# Patient Record
Sex: Female | Born: 1950
Health system: Southern US, Community
[De-identification: ages and names within clinical notes are randomized; demographics above are authoritative.]

## PROBLEM LIST (undated history)

## (undated) DIAGNOSIS — F17200 Nicotine dependence, unspecified, uncomplicated: Secondary | ICD-10-CM

## (undated) DIAGNOSIS — S42309A Unspecified fracture of shaft of humerus, unspecified arm, initial encounter for closed fracture: Secondary | ICD-10-CM

## (undated) DIAGNOSIS — N6320 Unspecified lump in the left breast, unspecified quadrant: Secondary | ICD-10-CM

## (undated) DIAGNOSIS — C801 Malignant (primary) neoplasm, unspecified: Secondary | ICD-10-CM

## (undated) DIAGNOSIS — M1990 Unspecified osteoarthritis, unspecified site: Secondary | ICD-10-CM

## (undated) DIAGNOSIS — R0603 Acute respiratory distress: Secondary | ICD-10-CM

## (undated) DIAGNOSIS — G5631 Lesion of radial nerve, right upper limb: Secondary | ICD-10-CM

## (undated) DIAGNOSIS — K219 Gastro-esophageal reflux disease without esophagitis: Secondary | ICD-10-CM

## (undated) DIAGNOSIS — K769 Liver disease, unspecified: Secondary | ICD-10-CM

## (undated) HISTORY — PX: APPENDECTOMY: SHX54

## (undated) HISTORY — PX: TUBAL LIGATION: SHX77

---

## 2001-01-09 ENCOUNTER — Other Ambulatory Visit: Admission: RE | Admit: 2001-01-09 | Discharge: 2001-01-09 | Payer: Self-pay | Admitting: Gynecology

## 2002-11-05 ENCOUNTER — Encounter: Admission: RE | Admit: 2002-11-05 | Discharge: 2002-11-05 | Payer: Self-pay | Admitting: Family Medicine

## 2002-11-05 ENCOUNTER — Encounter: Payer: Self-pay | Admitting: Family Medicine

## 2002-12-04 ENCOUNTER — Other Ambulatory Visit: Admission: RE | Admit: 2002-12-04 | Discharge: 2002-12-04 | Payer: Self-pay | Admitting: Family Medicine

## 2002-12-05 ENCOUNTER — Encounter: Admission: RE | Admit: 2002-12-05 | Discharge: 2002-12-05 | Payer: Self-pay | Admitting: Family Medicine

## 2002-12-05 ENCOUNTER — Encounter: Payer: Self-pay | Admitting: Family Medicine

## 2012-05-11 ENCOUNTER — Inpatient Hospital Stay (HOSPITAL_COMMUNITY)
Admission: EM | Admit: 2012-05-11 | Discharge: 2012-05-14 | DRG: 493 | Disposition: A | Discharge status: Home / Self Care | Payer: 59 | Attending: Emergency Medicine | Admitting: Emergency Medicine

## 2012-05-11 ENCOUNTER — Emergency Department (HOSPITAL_COMMUNITY): Payer: Self-pay

## 2012-05-11 ENCOUNTER — Encounter (HOSPITAL_COMMUNITY): Payer: Self-pay | Admitting: Emergency Medicine

## 2012-05-11 DIAGNOSIS — F1020 Alcohol dependence, uncomplicated: Secondary | ICD-10-CM

## 2012-05-11 DIAGNOSIS — W010XXA Fall on same level from slipping, tripping and stumbling without subsequent striking against object, initial encounter: Secondary | ICD-10-CM | POA: Diagnosis present

## 2012-05-11 DIAGNOSIS — S52509A Unspecified fracture of the lower end of unspecified radius, initial encounter for closed fracture: Secondary | ICD-10-CM

## 2012-05-11 DIAGNOSIS — S82853A Displaced trimalleolar fracture of unspecified lower leg, initial encounter for closed fracture: Principal | ICD-10-CM | POA: Diagnosis present

## 2012-05-11 DIAGNOSIS — S52599A Other fractures of lower end of unspecified radius, initial encounter for closed fracture: Secondary | ICD-10-CM | POA: Diagnosis present

## 2012-05-11 DIAGNOSIS — Y92009 Unspecified place in unspecified non-institutional (private) residence as the place of occurrence of the external cause: Secondary | ICD-10-CM

## 2012-05-11 DIAGNOSIS — F17200 Nicotine dependence, unspecified, uncomplicated: Secondary | ICD-10-CM | POA: Diagnosis present

## 2012-05-11 HISTORY — DX: Gastro-esophageal reflux disease without esophagitis: K21.9

## 2012-05-11 HISTORY — PX: WRIST FRACTURE SURGERY: SHX121

## 2012-05-11 HISTORY — PX: ANKLE FRACTURE SURGERY: SHX122

## 2012-05-11 HISTORY — DX: Unspecified osteoarthritis, unspecified site: M19.90

## 2012-05-11 LAB — CBC WITH DIFFERENTIAL/PLATELET
Basophils Absolute: 0 10*3/uL (ref 0.0–0.1)
Basophils Relative: 0 % (ref 0–1)
HCT: 41.4 % (ref 36.0–46.0)
Lymphocytes Relative: 12 % (ref 12–46)
MCHC: 34.8 g/dL (ref 30.0–36.0)
Monocytes Absolute: 0.7 10*3/uL (ref 0.1–1.0)
Neutro Abs: 9.1 10*3/uL — ABNORMAL HIGH (ref 1.7–7.7)
Platelets: 225 10*3/uL (ref 150–400)
RDW: 12.1 % (ref 11.5–15.5)
WBC: 11.2 10*3/uL — ABNORMAL HIGH (ref 4.0–10.5)

## 2012-05-11 LAB — ETHANOL: Alcohol, Ethyl (B): 144 mg/dL — ABNORMAL HIGH (ref 0–11)

## 2012-05-11 LAB — BASIC METABOLIC PANEL
Calcium: 9 mg/dL (ref 8.4–10.5)
Chloride: 101 mEq/L (ref 96–112)
Creatinine, Ser: 0.43 mg/dL — ABNORMAL LOW (ref 0.50–1.10)
GFR calc Af Amer: >90 mL/min (ref >90)
Sodium: 135 mEq/L (ref 135–145)

## 2012-05-11 MED ORDER — ONDANSETRON 4 MG PO TBDP
8.0000 mg | ORAL_TABLET | Freq: Once | ORAL | Status: AC
  Administered 2012-05-11: 8 mg via ORAL

## 2012-05-11 MED ORDER — OXYCODONE-ACETAMINOPHEN 5-325 MG PO TABS
1.0000 | ORAL_TABLET | Freq: Once | ORAL | Status: AC
  Administered 2012-05-11: 1 via ORAL
  Filled 2012-05-11: qty 1

## 2012-05-11 MED ORDER — HYDROMORPHONE HCL PF 1 MG/ML IJ SOLN
1.0000 mg | Freq: Once | INTRAMUSCULAR | Status: AC
  Administered 2012-05-11: 1 mg via INTRAMUSCULAR

## 2012-05-11 MED ORDER — ONDANSETRON 4 MG PO TBDP
ORAL_TABLET | ORAL | Status: AC
  Filled 2012-05-11: qty 8

## 2012-05-11 MED ORDER — HYDROMORPHONE HCL PF 1 MG/ML IJ SOLN
INTRAMUSCULAR | Status: AC
  Filled 2012-05-11: qty 1

## 2012-05-11 NOTE — ED Notes (Signed)
Per EMS, pt has fall, slipped on wet leaves, no LOC, did not hit back of head, denies back, head, neck pain, pain to L ankle and L wrist, ankle splinted at EMS arrival- no deformity noted, but swelling to both; ETOH - 6 beers tonight; vss 160/90, hr 90, 16;

## 2012-05-11 NOTE — ED Notes (Signed)
Called pt in triage but no answer.

## 2012-05-11 NOTE — ED Notes (Addendum)
Pt reports that she slipped in fell, denies LOC; c/o L wrist and L ankle pain; swelling noted at both; deformity to L wrist; CMS intact for wrist and ankle; pt has strong radial and dorsalis pedis pulse; pt denies hitting head, denies neck pain

## 2012-05-11 NOTE — ED Provider Notes (Signed)
History     CSN: [id number]  Arrival date & time 05/11/12  1958   First MD Initiated Contact with Patient 05/11/12 2116      Chief Complaint  Patient presents with  . Fall    (Consider location/radiation/quality/duration/timing/severity/associated sxs/prior treatment) HPI History provided by pt.   Pt had a fall in the driveway after consuming 6 beers this evening.  Landed on outstretched left hand as well as knees and her husband reports that her left ankle was twisted under her.  Did not hit head and denies neck and back pain.  C/o mild pain in left wrist and ankle only.  Unable to bear weight on ankle.  No associated paresthesias.  Pt has no PMH and is not anti-coagulated.   History reviewed. No pertinent past medical history.  Past Surgical History  Procedure Date  . Appendectomy   . Tubal ligation     No family history on file.  History  Substance Use Topics  . Smoking status: Current Every Day Smoker -- 1.0 packs/day    Types: Cigarettes  . Smokeless tobacco: Not on file  . Alcohol Use: Yes     6 beers per day    OB History    Grav Para Term Preterm Abortions TAB SAB Ect Mult Living                  Review of Systems  All other systems reviewed and are negative.    Allergies  Review of patient's allergies indicates no known allergies.  Home Medications   Current Outpatient Rx  Name Route Sig Dispense Refill  . LORATADINE 10 MG PO TABS Oral Take 10 mg by mouth daily as needed. For allergies    . OMEPRAZOLE 20 MG PO CPDR Oral Take 20 mg by mouth daily.      BP 151/76  Pulse 80  Temp 98.5 F (36.9 C) (Oral)  Resp 20  SpO2 97%  Physical Exam  Nursing note and vitals reviewed. Constitutional: She is oriented to person, place, and time. She appears well-developed and well-nourished. No distress.  HENT:  Head: Normocephalic and atraumatic.  Eyes:       Normal appearance  Neck: Normal range of motion.  Cardiovascular: Normal rate and regular  rhythm.   Pulmonary/Chest: Effort normal and breath sounds normal. No respiratory distress.  Musculoskeletal: Normal range of motion.       Edema and ecchymosis left medial greater than lateral malleolus.  Mild tenderness medial malleolus and distal third of tibia.  Mild ecchymosis w/out tenderness of mid-tibia.  Nml knee and nml foot, active ROM of toes, 2+ DP pulse and distal sensation intact.  Edema over dorsomedial surface of left radius w/ mild tenderness and minimal pain w/ passive ROM.  Nml elbow and shoulder.  2+ radial pulse and distal sensation intact.     Neurological: She is alert and oriented to person, place, and time.  Skin: Skin is warm and dry. No rash noted.  Psychiatric: She has a normal mood and affect. Her behavior is normal.    ED Course  Procedures (including critical care time)   Labs Reviewed  GLUCOSE, CAPILLARY   Dg Wrist Complete Left  05/11/2012  *RADIOLOGY REPORT*  Clinical Data: Left wrist pain and deformity after fall.  LEFT WRIST - COMPLETE 3+ VIEW  Comparison: None.  Findings: Comminuted fractures of the distal left radial metaphysis with volar displacement, angulation, and overriding of fracture fragments.  Fracture lines appear to extend to  the radial ulnar joint.  No definite extension to the radial carpal joint. No associated ulnar styloid process fracture.  Diffuse soft tissue swelling.  Underlying degenerative changes are apparent in the STT and first carpometacarpal joints.  No focal bone lesions appreciated.  IMPRESSION: Comminuted fractures of the distal left radius.   Original Report Authenticated By: Burman Nieves, M.D.    Dg Ankle Complete Left  05/11/2012  *RADIOLOGY REPORT*  Clinical Data: Left ankle pain and deformity after fall.  LEFT ANKLE COMPLETE - 3+ VIEW  Comparison: None.  Findings: There are comminuted trimalleolar fractures of the left ankle with mostly transverse fracture of the medial malleolus, oblique fracture of the lateral  malleolus, and coronal fracture of the posterior malleolus.  There is a lateral displacement and angulation of the distal fracture fragments with lateral displacement of the tibiotalar joint.  There is a posterior and superior displacement the tibiotalar joint with superior displacement of the posterior malleolar fracture fragment.  Soft tissue swelling.  No discrete bone lesions are appreciated.  IMPRESSION: Comminuted and displaced trimalleolar fractures the left ankle.   Original Report Authenticated By: Burman Nieves, M.D.      Diagnoses: L Trimalleolar Fracture L distal radius fracture   MDM  61yo F w/ alcoholism, otherwise healthy, had a fall after consuming 6 beers this evening and landed on outstretched left hand and knees.  C/o pain left ankle and left wrist.  Xrays show comminuted distal radius and comminuted/displaced trimalleolar fx.  Results discussed w/ pt.  Dr. Victorino Dike consulted, he reduced ankle in ED and he will admit her.            Arie Sabina [place], [place] 05/12/12 [phone]

## 2012-05-12 ENCOUNTER — Inpatient Hospital Stay (HOSPITAL_COMMUNITY): Payer: Self-pay

## 2012-05-12 ENCOUNTER — Encounter (HOSPITAL_COMMUNITY)
Admission: EM | Disposition: A | Discharge status: Home / Self Care | Payer: Self-pay | Source: Home / Self Care | Attending: Orthopedic Surgery

## 2012-05-12 ENCOUNTER — Encounter (HOSPITAL_COMMUNITY): Payer: Self-pay | Admitting: Anesthesiology

## 2012-05-12 ENCOUNTER — Inpatient Hospital Stay (HOSPITAL_COMMUNITY): Payer: Self-pay | Admitting: Anesthesiology

## 2012-05-12 ENCOUNTER — Encounter (HOSPITAL_COMMUNITY): Payer: Self-pay

## 2012-05-12 HISTORY — PX: ORIF ANKLE FRACTURE: SHX5408

## 2012-05-12 HISTORY — PX: ORIF WRIST FRACTURE: SHX2133

## 2012-05-12 LAB — HEPATIC FUNCTION PANEL
ALT: 12 U/L (ref 0–35)
Indirect Bilirubin: 0.4 mg/dL (ref 0.3–0.9)
Total Protein: 6 g/dL (ref 6.0–8.3)

## 2012-05-12 LAB — SURGICAL PCR SCREEN
MRSA, PCR: NEGATIVE
Staphylococcus aureus: NEGATIVE

## 2012-05-12 SURGERY — OPEN REDUCTION INTERNAL FIXATION (ORIF) ANKLE FRACTURE
Anesthesia: General | Site: Arm Lower | Laterality: Left | Wound class: Clean

## 2012-05-12 MED ORDER — THIAMINE HCL 100 MG/ML IJ SOLN: 100.0000 mg | Freq: Every day | INTRAMUSCULAR | Status: DC

## 2012-05-12 MED ORDER — PANTOPRAZOLE SODIUM 40 MG PO TBEC
40.0000 mg | DELAYED_RELEASE_TABLET | Freq: Every day | ORAL | Status: DC
  Administered 2012-05-13 – 2012-05-14 (×2): 40 mg via ORAL
  Filled 2012-05-12 (×2): qty 1

## 2012-05-12 MED ORDER — LACTATED RINGERS IV SOLN
INTRAVENOUS | Status: DC | PRN
  Administered 2012-05-12 (×2): via INTRAVENOUS

## 2012-05-12 MED ORDER — LORAZEPAM 1 MG PO TABS: 1.0000 mg | ORAL_TABLET | Freq: Four times a day (QID) | ORAL | Status: DC | PRN

## 2012-05-12 MED ORDER — DOCUSATE SODIUM 100 MG PO CAPS
100.0000 mg | ORAL_CAPSULE | Freq: Two times a day (BID) | ORAL | Status: DC
  Administered 2012-05-12 – 2012-05-14 (×4): 100 mg via ORAL
  Filled 2012-05-12 (×6): qty 1

## 2012-05-12 MED ORDER — VITAMIN B-1 100 MG PO TABS
100.0000 mg | ORAL_TABLET | Freq: Every day | ORAL | Status: DC
  Administered 2012-05-12 – 2012-05-14 (×3): 100 mg via ORAL
  Filled 2012-05-12 (×3): qty 1

## 2012-05-12 MED ORDER — NICOTINE 21 MG/24HR TD PT24
21.0000 mg | MEDICATED_PATCH | Freq: Every day | TRANSDERMAL | Status: DC
  Administered 2012-05-12 – 2012-05-14 (×3): 21 mg via TRANSDERMAL
  Filled 2012-05-12 (×3): qty 1

## 2012-05-12 MED ORDER — MAGNESIUM HYDROXIDE 400 MG/5ML PO SUSP: 30.0000 mL | Freq: Every day | ORAL | Status: DC | PRN

## 2012-05-12 MED ORDER — MIDAZOLAM HCL 5 MG/5ML IJ SOLN
INTRAMUSCULAR | Status: DC | PRN
  Administered 2012-05-12: 2 mg via INTRAVENOUS

## 2012-05-12 MED ORDER — FOLIC ACID 1 MG PO TABS
1.0000 mg | ORAL_TABLET | Freq: Every day | ORAL | Status: DC
  Administered 2012-05-12 – 2012-05-14 (×3): 1 mg via ORAL
  Filled 2012-05-12 (×3): qty 1

## 2012-05-12 MED ORDER — ENOXAPARIN SODIUM 40 MG/0.4ML ~~LOC~~ SOLN
40.0000 mg | SUBCUTANEOUS | Status: DC
  Administered 2012-05-13 – 2012-05-14 (×2): 40 mg via SUBCUTANEOUS
  Filled 2012-05-12 (×3): qty 0.4

## 2012-05-12 MED ORDER — ONDANSETRON HCL 4 MG/2ML IJ SOLN: 4.0000 mg | Freq: Four times a day (QID) | INTRAMUSCULAR | Status: DC | PRN

## 2012-05-12 MED ORDER — OXYCODONE HCL 5 MG PO TABS
ORAL_TABLET | ORAL | Status: AC
  Filled 2012-05-12: qty 2

## 2012-05-12 MED ORDER — ADULT MULTIVITAMIN W/MINERALS CH
1.0000 | ORAL_TABLET | Freq: Every day | ORAL | Status: DC
  Administered 2012-05-12 – 2012-05-14 (×3): 1 via ORAL
  Filled 2012-05-12 (×3): qty 1

## 2012-05-12 MED ORDER — ACETAMINOPHEN 325 MG PO TABS
650.0000 mg | ORAL_TABLET | Freq: Four times a day (QID) | ORAL | Status: DC | PRN
  Administered 2012-05-12 – 2012-05-13 (×4): 650 mg via ORAL
  Filled 2012-05-12 (×4): qty 2

## 2012-05-12 MED ORDER — OXYCODONE HCL 5 MG PO TABS: 5.0000 mg | ORAL_TABLET | Freq: Once | ORAL | Status: DC | PRN

## 2012-05-12 MED ORDER — CEFAZOLIN SODIUM-DEXTROSE 2-3 GM-% IV SOLR
2.0000 g | Freq: Four times a day (QID) | INTRAVENOUS | Status: AC
Start: 2012-05-12 — End: 2012-05-13
  Administered 2012-05-12 – 2012-05-13 (×3): 2 g via INTRAVENOUS
  Filled 2012-05-12 (×3): qty 50

## 2012-05-12 MED ORDER — METHOCARBAMOL 100 MG/ML IJ SOLN
500.0000 mg | Freq: Four times a day (QID) | INTRAVENOUS | Status: DC | PRN
  Filled 2012-05-12: qty 5

## 2012-05-12 MED ORDER — HYDROMORPHONE HCL PF 1 MG/ML IJ SOLN
1.0000 mg | INTRAMUSCULAR | Status: DC | PRN
  Administered 2012-05-12: 1 mg via INTRAVENOUS
  Filled 2012-05-12: qty 1

## 2012-05-12 MED ORDER — METHOCARBAMOL 500 MG PO TABS
ORAL_TABLET | ORAL | Status: AC
  Filled 2012-05-12: qty 1

## 2012-05-12 MED ORDER — METOCLOPRAMIDE HCL 5 MG/ML IJ SOLN: 5.0000 mg | Freq: Three times a day (TID) | INTRAMUSCULAR | Status: DC | PRN

## 2012-05-12 MED ORDER — LIDOCAINE HCL (CARDIAC) 20 MG/ML IV SOLN
INTRAVENOUS | Status: DC | PRN
  Administered 2012-05-12: 100 mg via INTRAVENOUS

## 2012-05-12 MED ORDER — ROCURONIUM BROMIDE 100 MG/10ML IV SOLN
INTRAVENOUS | Status: DC | PRN
  Administered 2012-05-12: 20 mg via INTRAVENOUS

## 2012-05-12 MED ORDER — FENTANYL CITRATE 0.05 MG/ML IJ SOLN
INTRAMUSCULAR | Status: DC | PRN
  Administered 2012-05-12 (×2): 100 ug via INTRAVENOUS
  Administered 2012-05-12 (×2): 125 ug via INTRAVENOUS
  Administered 2012-05-12 (×2): 150 ug via INTRAVENOUS

## 2012-05-12 MED ORDER — NEOSTIGMINE METHYLSULFATE 1 MG/ML IJ SOLN
INTRAMUSCULAR | Status: DC | PRN
  Administered 2012-05-12: 2 mg via INTRAVENOUS

## 2012-05-12 MED ORDER — ACETAMINOPHEN 650 MG RE SUPP: 650.0000 mg | Freq: Four times a day (QID) | RECTAL | Status: DC | PRN

## 2012-05-12 MED ORDER — MEPERIDINE HCL 25 MG/ML IJ SOLN: 6.2500 mg | INTRAMUSCULAR | Status: DC | PRN

## 2012-05-12 MED ORDER — LABETALOL HCL 5 MG/ML IV SOLN
INTRAVENOUS | Status: DC | PRN
  Administered 2012-05-12: 10 mg via INTRAVENOUS

## 2012-05-12 MED ORDER — HYDROMORPHONE HCL PF 1 MG/ML IJ SOLN
0.5000 mg | INTRAMUSCULAR | Status: DC | PRN
Start: 2012-05-12 — End: 2012-05-14
  Filled 2012-05-12: qty 1

## 2012-05-12 MED ORDER — BACITRACIN ZINC 500 UNIT/GM EX OINT
TOPICAL_OINTMENT | CUTANEOUS | Status: DC | PRN
  Administered 2012-05-12: 1 via TOPICAL

## 2012-05-12 MED ORDER — CEFAZOLIN SODIUM-DEXTROSE 2-3 GM-% IV SOLR
INTRAVENOUS | Status: DC | PRN
  Administered 2012-05-12: 2 g via INTRAVENOUS

## 2012-05-12 MED ORDER — METOCLOPRAMIDE HCL 10 MG PO TABS: 5.0000 mg | ORAL_TABLET | Freq: Three times a day (TID) | ORAL | Status: DC | PRN

## 2012-05-12 MED ORDER — ONDANSETRON HCL 4 MG/2ML IJ SOLN: 4.0000 mg | Freq: Once | INTRAMUSCULAR | Status: DC | PRN

## 2012-05-12 MED ORDER — GLYCOPYRROLATE 0.2 MG/ML IJ SOLN
INTRAMUSCULAR | Status: DC | PRN
  Administered 2012-05-12: 0.4 mg via INTRAVENOUS

## 2012-05-12 MED ORDER — HYDROMORPHONE HCL PF 1 MG/ML IJ SOLN
INTRAMUSCULAR | Status: AC
  Filled 2012-05-12: qty 1

## 2012-05-12 MED ORDER — ONDANSETRON HCL 4 MG PO TABS: 4.0000 mg | ORAL_TABLET | Freq: Four times a day (QID) | ORAL | Status: DC | PRN

## 2012-05-12 MED ORDER — METHOCARBAMOL 500 MG PO TABS
500.0000 mg | ORAL_TABLET | Freq: Four times a day (QID) | ORAL | Status: DC | PRN
  Administered 2012-05-12 – 2012-05-14 (×5): 500 mg via ORAL
  Filled 2012-05-12 (×3): qty 1

## 2012-05-12 MED ORDER — SODIUM CHLORIDE 0.9 % IV SOLN
INTRAVENOUS | Status: DC
  Administered 2012-05-12: 05:00:00 via INTRAVENOUS

## 2012-05-12 MED ORDER — SODIUM CHLORIDE 0.9 % IV SOLN
INTRAVENOUS | Status: DC
  Administered 2012-05-13: 100 mL/h via INTRAVENOUS

## 2012-05-12 MED ORDER — SUCCINYLCHOLINE CHLORIDE 20 MG/ML IJ SOLN
INTRAMUSCULAR | Status: DC | PRN
  Administered 2012-05-12: 120 mg via INTRAVENOUS

## 2012-05-12 MED ORDER — OXYCODONE HCL 5 MG PO TABS
5.0000 mg | ORAL_TABLET | ORAL | Status: DC | PRN
  Administered 2012-05-12 – 2012-05-14 (×8): 10 mg via ORAL
  Filled 2012-05-12 (×8): qty 2

## 2012-05-12 MED ORDER — SENNA 8.6 MG PO TABS
1.0000 | ORAL_TABLET | Freq: Two times a day (BID) | ORAL | Status: DC
  Administered 2012-05-12 – 2012-05-14 (×5): 8.6 mg via ORAL
  Filled 2012-05-12 (×6): qty 1

## 2012-05-12 MED ORDER — 0.9 % SODIUM CHLORIDE (POUR BTL) OPTIME
TOPICAL | Status: DC | PRN
  Administered 2012-05-12: 1000 mL

## 2012-05-12 MED ORDER — BUPIVACAINE HCL (PF) 0.25 % IJ SOLN
INTRAMUSCULAR | Status: DC | PRN
  Administered 2012-05-12: 10 mL

## 2012-05-12 MED ORDER — LORAZEPAM 2 MG/ML IJ SOLN: 1.0000 mg | Freq: Four times a day (QID) | INTRAMUSCULAR | Status: DC | PRN

## 2012-05-12 MED ORDER — HYDROMORPHONE HCL PF 1 MG/ML IJ SOLN
0.2500 mg | INTRAMUSCULAR | Status: DC | PRN
  Administered 2012-05-12 (×2): 0.5 mg via INTRAVENOUS

## 2012-05-12 MED ORDER — CEFAZOLIN SODIUM-DEXTROSE 2-3 GM-% IV SOLR
2.0000 g | INTRAVENOUS | Status: DC
  Filled 2012-05-12: qty 50

## 2012-05-12 MED ORDER — PROPOFOL 10 MG/ML IV BOLUS
INTRAVENOUS | Status: DC | PRN
  Administered 2012-05-12: 50 mg via INTRAVENOUS
  Administered 2012-05-12: 100 mg via INTRAVENOUS
  Administered 2012-05-12: 50 mg via INTRAVENOUS

## 2012-05-12 MED ORDER — OXYCODONE HCL 5 MG/5ML PO SOLN: 5.0000 mg | Freq: Once | ORAL | Status: DC | PRN

## 2012-05-12 SURGICAL SUPPLY — 83 items
BANDAGE ELASTIC 3 VELCRO ST LF (GAUZE/BANDAGES/DRESSINGS) ×1 IMPLANT
BANDAGE ESMARK 6X9 LF (GAUZE/BANDAGES/DRESSINGS) ×2 IMPLANT
BIT DRILL 2 FAST STEP (BIT) ×1 IMPLANT
BIT DRILL 2.5X2.75 QC CALB (BIT) ×1 IMPLANT
BIT DRILL 2.5X4 QC (BIT) ×1 IMPLANT
BIT DRILL 3.5X5.5 QC CALB (BIT) ×1 IMPLANT
BLADE SURG 15 STRL LF DISP TIS (BLADE) ×2 IMPLANT
BLADE SURG 15 STRL SS (BLADE) ×3
BNDG CMPR 9X6 STRL LF SNTH (GAUZE/BANDAGES/DRESSINGS) ×2
BNDG COHESIVE 4X5 TAN STRL (GAUZE/BANDAGES/DRESSINGS) ×3 IMPLANT
BNDG COHESIVE 6X5 TAN STRL LF (GAUZE/BANDAGES/DRESSINGS) ×3 IMPLANT
BNDG ESMARK 6X9 LF (GAUZE/BANDAGES/DRESSINGS) ×3
CHLORAPREP W/TINT 26ML (MISCELLANEOUS) ×3 IMPLANT
CLOTH BEACON ORANGE TIMEOUT ST (SAFETY) ×3 IMPLANT
COVER SURGICAL LIGHT HANDLE (MISCELLANEOUS) ×3 IMPLANT
CUFF TOURNIQUET SINGLE 34IN LL (TOURNIQUET CUFF) ×3 IMPLANT
CUFF TOURNIQUET SINGLE 44IN (TOURNIQUET CUFF) IMPLANT
DRAPE C-ARM 42X72 X-RAY (DRAPES) ×3 IMPLANT
DRAPE C-ARMOR (DRAPES) ×3 IMPLANT
DRAPE U-SHAPE 47X51 STRL (DRAPES) ×3 IMPLANT
DRSG ADAPTIC 3X8 NADH LF (GAUZE/BANDAGES/DRESSINGS) ×1 IMPLANT
DRSG PAD ABDOMINAL 8X10 ST (GAUZE/BANDAGES/DRESSINGS) ×5 IMPLANT
ELECT REM PT RETURN 9FT ADLT (ELECTROSURGICAL) ×3
ELECTRODE REM PT RTRN 9FT ADLT (ELECTROSURGICAL) ×2 IMPLANT
GAUZE XEROFORM 1X8 LF (GAUZE/BANDAGES/DRESSINGS) ×1 IMPLANT
GLOVE BIO SURGEON STRL SZ8 (GLOVE) ×3 IMPLANT
GLOVE BIOGEL PI IND STRL 8 (GLOVE) ×2 IMPLANT
GLOVE BIOGEL PI INDICATOR 8 (GLOVE) ×1
GOWN PREVENTION PLUS XLARGE (GOWN DISPOSABLE) ×3 IMPLANT
GOWN STRL NON-REIN LRG LVL3 (GOWN DISPOSABLE) ×6 IMPLANT
K-WIRE ACE 1.6X6 (WIRE) ×9
KIT BASIN OR (CUSTOM PROCEDURE TRAY) ×3 IMPLANT
KIT ROOM TURNOVER OR (KITS) ×3 IMPLANT
KWIRE ACE 1.6X6 (WIRE) IMPLANT
MANIFOLD NEPTUNE II (INSTRUMENTS) ×3 IMPLANT
NEEDLE 22X1 1/2 (OR ONLY) (NEEDLE) IMPLANT
NS IRRIG 1000ML POUR BTL (IV SOLUTION) ×3 IMPLANT
PACK ORTHO EXTREMITY (CUSTOM PROCEDURE TRAY) ×3 IMPLANT
PAD ARMBOARD 7.5X6 YLW CONV (MISCELLANEOUS) ×6 IMPLANT
PAD CAST 4YDX4 CTTN HI CHSV (CAST SUPPLIES) ×2 IMPLANT
PADDING CAST ABS 3INX4YD NS (CAST SUPPLIES) ×1
PADDING CAST ABS 4INX4YD NS (CAST SUPPLIES) ×1
PADDING CAST ABS COTTON 3X4 (CAST SUPPLIES) IMPLANT
PADDING CAST ABS COTTON 4X4 ST (CAST SUPPLIES) IMPLANT
PADDING CAST COTTON 4X4 STRL (CAST SUPPLIES) ×3
PADDING CAST COTTON 6X4 STRL (CAST SUPPLIES) ×1 IMPLANT
PEG SUBCHONDRAL SMOOTH 2.0X18 (Peg) ×1 IMPLANT
PEG SUBCHONDRAL SMOOTH 2.0X20 (Peg) ×1 IMPLANT
PEG SUBCHONDRAL SMOOTH 2.0X22 (Peg) ×2 IMPLANT
PEG SUBCHONDRAL SMOOTH 2.0X24 (Peg) ×1 IMPLANT
PLATE ACE 100DEG 6HOLE (Plate) ×1 IMPLANT
PLATE SHORT 24.4X51.3 LT (Plate) ×1 IMPLANT
SCREW ACE CAN 4.0 38M (Screw) ×1 IMPLANT
SCREW ACE CAN 4.0 44M (Screw) ×2 IMPLANT
SCREW BN 12X3.5XNS CORT TI (Screw) IMPLANT
SCREW CORT 3.5X10 LNG (Screw) ×1 IMPLANT
SCREW CORT 3.5X12 (Screw) ×6 IMPLANT
SCREW CORTICAL 3.5MM  12MM (Screw) ×1 IMPLANT
SCREW CORTICAL 3.5MM  16MM (Screw) ×2 IMPLANT
SCREW CORTICAL 3.5MM 12MM (Screw) IMPLANT
SCREW CORTICAL 3.5MM 14MM (Screw) ×2 IMPLANT
SCREW CORTICAL 3.5MM 16MM (Screw) IMPLANT
SCREW CORTICAL 3.5MM 18MM (Screw) ×1 IMPLANT
SCREW CORTICAL 3.5MM 24MM (Screw) ×1 IMPLANT
SCREW MULTI DIRECT 16MM (Screw) ×1 IMPLANT
SCREW MULTI DIRECT 20MM (Screw) ×1 IMPLANT
SPLINT PLASTER CAST XFAST 5X30 (CAST SUPPLIES) IMPLANT
SPLINT PLASTER XFAST SET 5X30 (CAST SUPPLIES) ×1
SPONGE GAUZE 4X4 12PLY (GAUZE/BANDAGES/DRESSINGS) ×3 IMPLANT
SPONGE LAP 18X18 X RAY DECT (DISPOSABLE) ×3 IMPLANT
STAPLER VISISTAT 35W (STAPLE) IMPLANT
SUCTION FRAZIER TIP 10 FR DISP (SUCTIONS) ×3 IMPLANT
SUT MNCRL AB 3-0 PS2 18 (SUTURE) IMPLANT
SUT PROLENE 3 0 PS 2 (SUTURE) ×3 IMPLANT
SUT VIC AB 2-0 CT1 27 (SUTURE) ×6
SUT VIC AB 2-0 CT1 TAPERPNT 27 (SUTURE) ×4 IMPLANT
SUT VIC AB 3-0 PS2 18 (SUTURE) ×3
SUT VIC AB 3-0 PS2 18XBRD (SUTURE) ×2 IMPLANT
SYR CONTROL 10ML LL (SYRINGE) IMPLANT
TOWEL OR 17X24 6PK STRL BLUE (TOWEL DISPOSABLE) ×3 IMPLANT
TOWEL OR 17X26 10 PK STRL BLUE (TOWEL DISPOSABLE) ×3 IMPLANT
TUBE CONNECTING 12X1/4 (SUCTIONS) ×3 IMPLANT
WATER STERILE IRR 1000ML POUR (IV SOLUTION) ×3 IMPLANT

## 2012-05-12 NOTE — Brief Op Note (Signed)
05/11/2012 - 05/12/2012  11:09 AM  PATIENT:  Taylor [name]  61 y.o. female  PRE-OPERATIVE DIAGNOSIS:  Left ankle trimalleolar fracture  POST-OPERATIVE DIAGNOSIS:  Same  Procedure(s): 1.  Open reduction and internal fixation of left ankle trimalleolar fracture with fixation of the posterior malleolus 2.  Fluoro 3.  Stress exam of left ankle under fluoro  SURGEON:  Toni Arthurs, MD  ASSISTANT: n/a  ANESTHESIA:   General  EBL:  minimal   TOURNIQUET:  54 min at 250 mm Hg  COMPLICATIONS:  None apparent  DISPOSITION:  Extubated, awake and stable to recovery.  DICTATION ID:  [id number]

## 2012-05-12 NOTE — Brief Op Note (Signed)
05/11/2012 - 05/12/2012  11:04 AM  PATIENT:  Taylor [name]  61 y.o. female  PRE-OPERATIVE DIAGNOSIS:  fractured left ankle and wrist  POST-OPERATIVE DIAGNOSIS:  * No post-op diagnosis entered *  PROCEDURE:  Procedure(s) (LRB) with comments: OPEN REDUCTION INTERNAL FIXATION (ORIF) ANKLE FRACTURE (Left) OPEN REDUCTION INTERNAL FIXATION (ORIF) WRIST FRACTURE (Left)  SURGEON:  Surgeon(s) and Role: Panel 1:    * Toni Arthurs, MD - Primary  Panel 2:    * Kathryne Hitch, MD - Primary  PHYSICIAN ASSISTANT:   ASSISTANTS: none   ANESTHESIA:   local and general  EBL:  Total I/O In: 1000 [I.V.:1000] Out: 650 [Urine:550; Blood:100]  BLOOD ADMINISTERED:none  DRAINS: none   LOCAL MEDICATIONS USED:  MARCAINE     SPECIMEN:  No Specimen  DISPOSITION OF SPECIMEN:  N/A  COUNTS:  YES  TOURNIQUET:  58 min.  DICTATION: .Other Dictation: Dictation Number [id number]  PLAN OF CARE: Admit to inpatient   PATIENT DISPOSITION:  PACU - hemodynamically stable.   Delay start of Pharmacological VTE agent (>24hrs) due to surgical blood loss or risk of bleeding: no

## 2012-05-12 NOTE — Progress Notes (Signed)
PT Cancellation Note  Patient Details Name: Taylor [name] MRN: [id number] DOB: Nov 17, 1950   Cancelled Treatment:    Reason Eval/Treat Not Completed: Patient not medically ready;Patient at procedure or test/unavailable. Pt recently back from OR and RN stated to await evaluation until tomorrow.   Milana Kidney 05/12/2012, 1:49 PM

## 2012-05-12 NOTE — Progress Notes (Signed)
Orthopedic Tech Progress Note Patient Details:  Taylor [name] [birth year] [id number]  Ortho Devices Type of Ortho Device: Sling immobilizer;Sugartong splint;Short leg splint   Haskell Flirt 05/12/2012, 12:06 AM

## 2012-05-12 NOTE — Progress Notes (Signed)
Orthopedic Tech Progress Note Patient Details:  Taylor [name] Nov 24, 1950 [id number]  Ortho Devices Type of Ortho Device: Sling immobilizer;Sugartong splint;Short leg splint Ortho Device/Splint Location: LEFT WRIST SPLINT Ortho Device/Splint Interventions: Application   Cammer, Mickie Bail 05/12/2012, 12:04 PM

## 2012-05-12 NOTE — Anesthesia Procedure Notes (Signed)
Procedure Name: Intubation Date/Time: 05/12/2012 9:38 AM Performed by: Alanda Amass A Pre-anesthesia Checklist: Emergency Drugs available, Patient identified, Timeout performed, Suction available and Patient being monitored Patient Re-evaluated:Patient Re-evaluated prior to inductionOxygen Delivery Method: Circle system utilized Preoxygenation: Pre-oxygenation with 100% oxygen Intubation Type: IV induction, Rapid sequence and Cricoid Pressure applied Laryngoscope Size: Mac and 3 Grade View: Grade I Tube size: 7.5 mm Number of attempts: 1 Airway Equipment and Method: Stylet Placement Confirmation: ETT inserted through vocal cords under direct vision,  breath sounds checked- equal and bilateral and positive ETCO2 Secured at: 20 cm Tube secured with: Tape Dental Injury: Teeth and Oropharynx as per pre-operative assessment

## 2012-05-12 NOTE — Anesthesia Postprocedure Evaluation (Signed)
Anesthesia Post Note  Patient: Taylor [name]  Procedure(s) Performed: Procedure(s) (LRB): OPEN REDUCTION INTERNAL FIXATION (ORIF) ANKLE FRACTURE (Left) OPEN REDUCTION INTERNAL FIXATION (ORIF) WRIST FRACTURE (Left)  Anesthesia type: general  Patient location: PACU  Post pain: Pain level controlled  Post assessment: Patient's Cardiovascular Status Stable  Last Vitals:  Filed Vitals:   05/12/12 1145  BP: 139/73  Pulse: 80  Temp:   Resp: 15    Post vital signs: Reviewed and stable  Level of consciousness: sedated  Complications: No apparent anesthesia complications

## 2012-05-12 NOTE — Op Note (Signed)
NAME:  [name], Taylor                 ACCOUNT NO.:  [id number]  MEDICAL RECORD NO.:  [id number]  LOCATION:  5N31C                        FACILITY:  [facility]  PHYSICIAN:  Vanita Panda. Magnus Ivan, M.D.DATE OF BIRTH:  05-01-1951  DATE OF PROCEDURE:  05/12/2012 DATE OF DISCHARGE:                              OPERATIVE REPORT   PREOPERATIVE DIAGNOSIS:  Left 3-4 part distal radius fracture.  POSTOPERATIVE DIAGNOSIS:  Left 3-4 part distal radius fracture.  PROCEDURE:  Open reduction and internal fixation of left distal radius fracture.  IMPLANTS:  [employer] distal volar radial plate (DVR).  SURGEON:  Vanita Panda. Magnus Ivan, M.D.  ANESTHESIA: 1. General. 2. Local with 0.25% plain Marcaine.  TOURNIQUET TIME:  58 minutes.  BLOOD LOSS:  Minimal.  COMPLICATIONS:  None.  INDICATIONS:  [name] is a 61 year old female who was inebriated last night.  She has walked on her drive when she tripped and fell injuring her left ankle and her left wrist.  She was seen by Dr. Toni Arthurs of [facility], who reduced her ankle and her wrist.  I was on hand call and he felt it would be best for her to have her wrist fixed, at the same time, her ankle being fixed.  We are going to the OR today to fix both of these.  She understands the risks and benefits of these in detail and does wish to proceed with surgery.  PROCEDURE DESCRIPTION:  After informed consent was obtained, appropriate left wrist was marked and left ankle was marked, she was brought to the operating room, placed supine on the operating room table where the left arm on radiolucent arm table.  A nonsterile tourniquet was placed around her left upper arm and her left arm was prepped and draped with ChloraPrep and sterile drapes after pre-scrub.  General anesthesia was also obtained.  Simultaneously, Dr. Victorino Dike addressed the ankle and I addressed the wrist.  I used an Esmarch wrap out the arm and tourniquet was inflated  to 250 mm of pressure.  I then took a standard volar approach to the wrist.  I dissected down the interval between the flexor carpi radialis and the radial artery down to the pronator quadratus, which I then divided from radial to ulnar.  I then identified the fracture and was able to irrigate the fracture with hematoma.  I then pulled traction, was able to get the pieces into alignment.  I then chose a narrow and short hand innovations DVR plate and placed this along the volar cortex.  I then secured this with bicortical screws proximally and pegs and screws distally to hold the fracture and anatomically reduced.  I then copiously irrigated the soft tissues and closed the subcutaneous tissue with interrupted 2-0 Vicryl followed by interrupted 3-0 nylon on the skin.  I infiltrated the incision with 0.25% plain Sensorcaine.  Xeroform followed by well-padded sterile dressing was applied.  Postoperatively, I will place her in a removable Velcro wrist splint in the PACU and we will let her weightbear through the forearm, but not the wrist on the left side.     Vanita Panda. Magnus Ivan, M.D.     CYB/MEDQ  D:  05/12/2012  T:  05/12/2012  Job:  [id number]

## 2012-05-12 NOTE — Progress Notes (Signed)
[facility] History and Physical  Taylor [name]:[id number] DOB: 04/13/1951 DOA: 05/11/2012  Referring physician: Toni Arthurs, MD  PCP: Sheila Oats, MD   Chief Complaint: medical management of alcohol dependence  HPI:  61 y/o female with PMH of smoking and etoh abuse fell yesterday at home while walking in her driveway. She c/o pain in the left wrist and ankle. She is stattus post left ankle and wrist ORIF .  No h/o injury or surgery to either ankle or wrist in the past. Smokes 1.5 ppd. Pt had a fall in the driveway after consuming 6 beers this evening.She drinks about the same quantity everyday.Consultation is requested for monitoring for alcohol withdrawal.       Review of Systems: negative for the following  Constitutional: Denies fever, chills, diaphoresis, appetite change and fatigue.  HEENT: Denies photophobia, eye pain, redness, hearing loss, ear pain, congestion, sore throat, rhinorrhea, sneezing, mouth sores, trouble swallowing, neck pain, neck stiffness and tinnitus.  Respiratory: Denies SOB, DOE, cough, chest tightness, and wheezing.  Cardiovascular: Denies chest pain, palpitations and leg swelling.  Gastrointestinal: Denies nausea, vomiting, abdominal pain, diarrhea, constipation, blood in stool and abdominal distention.  Genitourinary: Denies dysuria, urgency, frequency, hematuria, flank pain and difficulty urinating.  Musculoskeletal: Denies myalgias, back pain, joint swelling, arthralgias and gait problem.  Skin: Denies pallor, rash and wound.  Neurological: Denies dizziness, seizures, syncope, weakness, light-headedness, numbness and headaches.  Hematological: Denies adenopathy. Easy bruising, personal or family bleeding history  Psychiatric/Behavioral: Denies suicidal ideation, mood changes, confusion, nervousness, sleep disturbance and agitation       History reviewed. No pertinent past medical history.   Past Surgical History  Procedure Date  .  Appendectomy   . Tubal ligation       Social History:  reports that she has been smoking Cigarettes.  She has been smoking about 1 pack per day. She does not have any smokeless tobacco history on file. She reports that she drinks alcohol. She reports that she does not use illicit drugs.    No Known Allergies  Family hx revived and found to be negative    Prior to Admission medications   Medication Sig Start Date End Date Taking? Authorizing Provider  loratadine (CLARITIN) 10 MG tablet Take 10 mg by mouth daily as needed. For allergies   Yes Historical Provider, MD  omeprazole (PRILOSEC) 20 MG capsule Take 20 mg by mouth daily.   Yes Historical Provider, MD     Physical Exam: Filed Vitals:   05/12/12 1145 05/12/12 1200 05/12/12 1215 05/12/12 1230  BP: 139/73 136/75 146/72 123/67  Pulse: 80 76 77 76  Temp:    98.1 F (36.7 C)  TempSrc:      Resp: 15 12 20 18   Height:      Weight:      SpO2: 97% 96% 97% 97%     Constitutional: Vital signs reviewed. Patient is a well-developed and well-nourished in no acute distress and cooperative with exam. Alert and oriented x3.  Head: Normocephalic and atraumatic  Ear: TM normal bilaterally  Mouth: no erythema or exudates, MMM  Eyes: PERRL, EOMI, conjunctivae normal, No scleral icterus.  Neck: Supple, Trachea midline normal ROM, No JVD, mass, thyromegaly, or carotid bruit present.  Cardiovascular: RRR, S1 normal, S2 normal, no MRG, pulses symmetric and intact bilaterally  Pulmonary/Chest: CTAB, no wheezes, rales, or rhonchi  Abdominal: Soft. Non-tender, non-distended, bowel sounds are normal, no masses, organomegaly, or guarding present.  GU: no CVA tenderness Musculoskeletal: No  joint deformities, erythema, or stiffness, ROM full and no nontender Ext: no edema and no cyanosis, pulses palpable bilaterally (DP and PT)  Hematology: no cervical, inginal, or axillary adenopathy.  Neurological: A&O x3, Strenght is normal and symmetric  bilaterally, cranial nerve II-XII are grossly intact, no focal motor deficit, sensory intact to light touch bilaterally.  Skin: Warm, dry and intact. No rash, cyanosis, or clubbing.  Psychiatric: Normal mood and affect. speech and behavior is normal. Judgment and thought content normal. Cognition and memory are normal.       Labs on Admission:    Basic Metabolic Panel:  Lab 05/11/12 5409  NA 135  K 3.6  CL 101  CO2 19  GLUCOSE 102*  BUN 6  CREATININE 0.43*  CALCIUM 9.0  MG --  PHOS --   Liver Function Tests: No results found for this basename: AST:5,ALT:5,ALKPHOS:5,BILITOT:5,PROT:5,ALBUMIN:5 in the last 168 hours No results found for this basename: LIPASE:5,AMYLASE:5 in the last 168 hours No results found for this basename: AMMONIA:5 in the last 168 hours CBC:  Lab 05/11/12 2142  WBC 11.2*  NEUTROABS 9.1*  HGB 14.4  HCT 41.4  MCV 92.2  PLT 225   Cardiac Enzymes: No results found for this basename: CKTOTAL:5,CKMB:5,CKMBINDEX:5,TROPONINI:5 in the last 168 hours  BNP (last 3 results) No results found for this basename: PROBNP:3 in the last 8760 hours    CBG:  Lab 05/11/12 2016  GLUCAP 95    Radiological Exams on Admission: Dg Wrist Complete Left  05/11/2012  *RADIOLOGY REPORT*  Clinical Data: Left wrist pain and deformity after fall.  LEFT WRIST - COMPLETE 3+ VIEW  Comparison: None.  Findings: Comminuted fractures of the distal left radial metaphysis with volar displacement, angulation, and overriding of fracture fragments.  Fracture lines appear to extend to the radial ulnar joint.  No definite extension to the radial carpal joint. No associated ulnar styloid process fracture.  Diffuse soft tissue swelling.  Underlying degenerative changes are apparent in the STT and first carpometacarpal joints.  No focal bone lesions appreciated.  IMPRESSION: Comminuted fractures of the distal left radius.   Original Report Authenticated By: Burman Nieves, M.D.    Dg Ankle  Complete Left  05/12/2012  *RADIOLOGY REPORT*  Clinical Data: Follow-up ankle fracture.  LEFT ANKLE COMPLETE - 3+ VIEW  Comparison: 05/11/2012  Findings: There is been interval placement of a fixation screws through the medial and posterior malleoli in the distal tibia, as well as a fixation plate and screws across a distal fibular fracture.  Fracture fragments are in anatomic alignment, and the talus is centered within the ankle mortise.  IMPRESSION: Internal fixation of trimalleolar ankle fracture in anatomic alignment.   Original Report Authenticated By: Myles Rosenthal, M.D.    Dg Ankle Complete Left  05/11/2012  *RADIOLOGY REPORT*  Clinical Data: Left ankle pain and deformity after fall.  LEFT ANKLE COMPLETE - 3+ VIEW  Comparison: None.  Findings: There are comminuted trimalleolar fractures of the left ankle with mostly transverse fracture of the medial malleolus, oblique fracture of the lateral malleolus, and coronal fracture of the posterior malleolus.  There is a lateral displacement and angulation of the distal fracture fragments with lateral displacement of the tibiotalar joint.  There is a posterior and superior displacement the tibiotalar joint with superior displacement of the posterior malleolar fracture fragment.  Soft tissue swelling.  No discrete bone lesions are appreciated.  IMPRESSION: Comminuted and displaced trimalleolar fractures the left ankle.   Original Report Authenticated By: Chrissie Noa  Andria Meuse, M.D.     EKG: Independently reviewed. pending  Assessment/Plan  1. Alcohol dependence , will start CIWA protocol, folic acid ,thiamine, check LFT's  2. S/P fall , given alcohol intoxication, and dependence, will check, ekg, 2 d echo, tele    Code Status:   full Family Communication: bedside Disposition Plan: admit   Time spent: 70 mins   [facility] Pager [phone]  If 7PM-7AM, please contact night-coverage [email or web address] Password [email or web address] 05/12/2012, 1:10  PM

## 2012-05-12 NOTE — Consult Note (Signed)
Reason for Consult:   Left wrist fracture Referring Physician:   Dr. Jerene [name] is an 61 y.o. female.  HPI: 61 yo right-handed female who sustained a mechanical fall on an outstretch left wrist in her driveway.  She was brought to the [facility] ER and found to have left wrist and ankle fractures.  Dr. Victorino [name] who was on-call for ortho reduced the left distal radius and left ankle and placed them in temporary splints for definitive fixation.  She denies left hand numbness and denies other upper extremity issues.  History reviewed. No pertinent past medical history.  Past Surgical History  Procedure Date  . Appendectomy   . Tubal ligation     No family history on file.  Social History:  reports that she has been smoking Cigarettes.  She has been smoking about 1 pack per day. She does not have any smokeless tobacco history on file. She reports that she drinks alcohol. She reports that she does not use illicit drugs.  Allergies: No Known Allergies  Medications: I have reviewed the patient's current medications.  Results for orders placed during the hospital encounter of 05/11/12 (from the past 48 hour(s))  GLUCOSE, CAPILLARY     Status: Normal   Collection Time   05/11/12  8:16 PM      Component Value Range Comment   Glucose-Capillary 95  70 - 99 mg/dL   CBC WITH DIFFERENTIAL     Status: Abnormal   Collection Time   05/11/12  9:42 PM      Component Value Range Comment   WBC 11.2 (*) 4.0 - 10.5 K/uL    RBC 4.49  3.87 - 5.11 MIL/uL    Hemoglobin 14.4  12.0 - 15.0 g/dL    HCT 40.9  81.1 - 91.4 %    MCV 92.2  78.0 - 100.0 fL    MCH 32.1  26.0 - 34.0 pg    MCHC 34.8  30.0 - 36.0 g/dL    RDW 78.2  95.6 - 21.3 %    Platelets 225  150 - 400 K/uL    Neutrophils Relative 82 (*) 43 - 77 %    Neutro Abs 9.1 (*) 1.7 - 7.7 K/uL    Lymphocytes Relative 12  12 - 46 %    Lymphs Abs 1.3  0.7 - 4.0 K/uL    Monocytes Relative 6  3 - 12 %    Monocytes Absolute 0.7  0.1 - 1.0 K/uL    Eosinophils Relative 0  0 - 5 %    Eosinophils Absolute 0.0  0.0 - 0.7 K/uL    Basophils Relative 0  0 - 1 %    Basophils Absolute 0.0  0.0 - 0.1 K/uL   BASIC METABOLIC PANEL     Status: Abnormal   Collection Time   05/11/12  9:42 PM      Component Value Range Comment   Sodium 135  135 - 145 mEq/L    Potassium 3.6  3.5 - 5.1 mEq/L    Chloride 101  96 - 112 mEq/L    CO2 19  19 - 32 mEq/L    Glucose, Bld 102 (*) 70 - 99 mg/dL    BUN 6  6 - 23 mg/dL    Creatinine, Ser 0.86 (*) 0.50 - 1.10 mg/dL    Calcium 9.0  8.4 - 57.8 mg/dL    GFR calc non Af Amer >90  >90 mL/min    GFR calc Af  Amer >90  >90 mL/min   ETHANOL     Status: Abnormal   Collection Time   05/11/12  9:42 PM      Component Value Range Comment   Alcohol, Ethyl (B) 144 (*) 0 - 11 mg/dL   SURGICAL PCR SCREEN     Status: Normal   Collection Time   05/12/12  5:09 AM      Component Value Range Comment   MRSA, PCR NEGATIVE  NEGATIVE    Staphylococcus aureus NEGATIVE  NEGATIVE     Dg Wrist Complete Left  05/11/2012  *RADIOLOGY REPORT*  Clinical Data: Left wrist pain and deformity after fall.  LEFT WRIST - COMPLETE 3+ VIEW  Comparison: None.  Findings: Comminuted fractures of the distal left radial metaphysis with volar displacement, angulation, and overriding of fracture fragments.  Fracture lines appear to extend to the radial ulnar joint.  No definite extension to the radial carpal joint. No associated ulnar styloid process fracture.  Diffuse soft tissue swelling.  Underlying degenerative changes are apparent in the STT and first carpometacarpal joints.  No focal bone lesions appreciated.  IMPRESSION: Comminuted fractures of the distal left radius.   Original Report Authenticated By: Burman Nieves, M.D.    Dg Ankle Complete Left  05/11/2012  *RADIOLOGY REPORT*  Clinical Data: Left ankle pain and deformity after fall.  LEFT ANKLE COMPLETE - 3+ VIEW  Comparison: None.  Findings: There are comminuted trimalleolar fractures of the  left ankle with mostly transverse fracture of the medial malleolus, oblique fracture of the lateral malleolus, and coronal fracture of the posterior malleolus.  There is a lateral displacement and angulation of the distal fracture fragments with lateral displacement of the tibiotalar joint.  There is a posterior and superior displacement the tibiotalar joint with superior displacement of the posterior malleolar fracture fragment.  Soft tissue swelling.  No discrete bone lesions are appreciated.  IMPRESSION: Comminuted and displaced trimalleolar fractures the left ankle.   Original Report Authenticated By: Burman Nieves, M.D.     ROS Blood pressure 145/74, pulse 92, temperature 100.5 F (38.1 C), temperature source Oral, resp. rate 16, height 5\' 2"  (1.575 m), weight 70.5 kg (155 lb 6.8 oz), SpO2 94.00%. Physical Exam  Musculoskeletal:       Left wrist: She exhibits decreased range of motion, tenderness, swelling and deformity.  fingers all show good perfusion and normal sensation/motor  Assessment/Plan: Displaced left distal radius fracture post mechanical fall 1) to the OR for open reduction/internal fixation; risks and benefits explained in length to the patient and understood  Taylor [name] Y 05/12/2012, 7:29 AM

## 2012-05-12 NOTE — Anesthesia Preprocedure Evaluation (Addendum)
Anesthesia Evaluation  Patient identified by MRN, date of birth, ID band Patient awake    Reviewed: Allergy & Precautions, H&P , NPO status , Patient's Chart, lab work & pertinent test results  Airway Mallampati: I TM Distance: >3 FB Neck ROM: Full    Dental  (+) Edentulous Upper and Dental Advisory Given   Pulmonary Current Smoker,          Cardiovascular     Neuro/Psych    GI/Hepatic   Endo/Other    Renal/GU      Musculoskeletal   Abdominal   Peds  Hematology   Anesthesia Other Findings   Reproductive/Obstetrics                          Anesthesia Physical Anesthesia Plan  ASA: II  Anesthesia Plan: General   Post-op Pain Management:    Induction: Intravenous  Airway Management Planned: Oral ETT  Additional Equipment:   Intra-op Plan:   Post-operative Plan: Extubation in OR  Informed Consent: I have reviewed the patients History and Physical, chart, labs and discussed the procedure including the risks, benefits and alternatives for the proposed anesthesia with the patient or authorized representative who has indicated his/her understanding and acceptance.     Plan Discussed with: CRNA and Surgeon  Anesthesia Plan Comments:         Anesthesia Quick Evaluation

## 2012-05-12 NOTE — Transfer of Care (Signed)
Immediate Anesthesia Transfer of Care Note  Patient: ACHSAH MCQUADE  Procedure(s) Performed: Procedure(s) (LRB) with comments: OPEN REDUCTION INTERNAL FIXATION (ORIF) ANKLE FRACTURE (Left) OPEN REDUCTION INTERNAL FIXATION (ORIF) WRIST FRACTURE (Left)  Patient Location: PACU  Anesthesia Type:General  Level of Consciousness: awake  Airway & Oxygen Therapy: Patient Spontanous Breathing and Patient connected to nasal cannula oxygen  Post-op Assessment: Report given to PACU RN and Post -op Vital signs reviewed and stable  Post vital signs: Reviewed and stable  Complications: No apparent anesthesia complications

## 2012-05-12 NOTE — Preoperative (Signed)
Beta Blockers   Reason not to administer Beta Blockers:Not Applicable 

## 2012-05-12 NOTE — H&P (Signed)
Taylor [name] is an 61 y.o. female.   Chief Complaint: left wrist and left ankle injuries HPI: 61 y/o female with PMH of smoking and etoh abuse fell earlier this evening at home while walking in her driveway.  She c/o pain in the left wrist and ankle.  Sharp and worse with motion and better with rest.  No h/o injury or surgery to either ankle or wrist in the past.  Smokes 1.5 ppd.    History reviewed. No pertinent past medical history.  Past Surgical History  Procedure Date  . Appendectomy   . Tubal ligation     No family history on file. Social History:  reports that she has been smoking Cigarettes.  She has been smoking about 1 pack per day. She does not have any smokeless tobacco history on file. She reports that she drinks alcohol. She reports that she does not use illicit drugs.  Allergies: No Known Allergies   (Not in a hospital admission)  Results for orders placed during the hospital encounter of 05/11/12 (from the past 48 hour(s))  GLUCOSE, CAPILLARY     Status: Normal   Collection Time   05/11/12  8:16 PM      Component Value Range Comment   Glucose-Capillary 95  70 - 99 mg/dL   CBC WITH DIFFERENTIAL     Status: Abnormal   Collection Time   05/11/12  9:42 PM      Component Value Range Comment   WBC 11.2 (*) 4.0 - 10.5 K/uL    RBC 4.49  3.87 - 5.11 MIL/uL    Hemoglobin 14.4  12.0 - 15.0 g/dL    HCT 16.1  09.6 - 04.5 %    MCV 92.2  78.0 - 100.0 fL    MCH 32.1  26.0 - 34.0 pg    MCHC 34.8  30.0 - 36.0 g/dL    RDW 40.9  81.1 - 91.4 %    Platelets 225  150 - 400 K/uL    Neutrophils Relative 82 (*) 43 - 77 %    Neutro Abs 9.1 (*) 1.7 - 7.7 K/uL    Lymphocytes Relative 12  12 - 46 %    Lymphs Abs 1.3  0.7 - 4.0 K/uL    Monocytes Relative 6  3 - 12 %    Monocytes Absolute 0.7  0.1 - 1.0 K/uL    Eosinophils Relative 0  0 - 5 %    Eosinophils Absolute 0.0  0.0 - 0.7 K/uL    Basophils Relative 0  0 - 1 %    Basophils Absolute 0.0  0.0 - 0.1 K/uL   BASIC METABOLIC PANEL      Status: Abnormal   Collection Time   05/11/12  9:42 PM      Component Value Range Comment   Sodium 135  135 - 145 mEq/L    Potassium 3.6  3.5 - 5.1 mEq/L    Chloride 101  96 - 112 mEq/L    CO2 19  19 - 32 mEq/L    Glucose, Bld 102 (*) 70 - 99 mg/dL    BUN 6  6 - 23 mg/dL    Creatinine, Ser 7.82 (*) 0.50 - 1.10 mg/dL    Calcium 9.0  8.4 - 95.6 mg/dL    GFR calc non Af Amer >90  >90 mL/min    GFR calc Af Amer >90  >90 mL/min   ETHANOL     Status: Abnormal   Collection Time  05/11/12  9:42 PM      Component Value Range Comment   Alcohol, Ethyl (B) 144 (*) 0 - 11 mg/dL    Dg Wrist Complete Left  05/11/2012  *RADIOLOGY REPORT*  Clinical Data: Left wrist pain and deformity after fall.  LEFT WRIST - COMPLETE 3+ VIEW  Comparison: None.  Findings: Comminuted fractures of the distal left radial metaphysis with volar displacement, angulation, and overriding of fracture fragments.  Fracture lines appear to extend to the radial ulnar joint.  No definite extension to the radial carpal joint. No associated ulnar styloid process fracture.  Diffuse soft tissue swelling.  Underlying degenerative changes are apparent in the STT and first carpometacarpal joints.  No focal bone lesions appreciated.  IMPRESSION: Comminuted fractures of the distal left radius.   Original Report Authenticated By: Burman Nieves, M.D.    Dg Ankle Complete Left  05/11/2012  *RADIOLOGY REPORT*  Clinical Data: Left ankle pain and deformity after fall.  LEFT ANKLE COMPLETE - 3+ VIEW  Comparison: None.  Findings: There are comminuted trimalleolar fractures of the left ankle with mostly transverse fracture of the medial malleolus, oblique fracture of the lateral malleolus, and coronal fracture of the posterior malleolus.  There is a lateral displacement and angulation of the distal fracture fragments with lateral displacement of the tibiotalar joint.  There is a posterior and superior displacement the tibiotalar joint with superior  displacement of the posterior malleolar fracture fragment.  Soft tissue swelling.  No discrete bone lesions are appreciated.  IMPRESSION: Comminuted and displaced trimalleolar fractures the left ankle.   Original Report Authenticated By: Burman Nieves, M.D.     ROS  No recent f/c/n/v/wt loss  Blood pressure 151/76, pulse 80, temperature 98.5 F (36.9 C), temperature source Oral, resp. rate 20, SpO2 97.00%. Physical Exam  [age] wd female in nad.  A and O x 4.  Mood and affect normal.  EOMI.  Respirations unlabored.  L ankle with swelling and ecchymosis.  No skin breakdown.  2+ dp and PT pulses.  Feels LT normally.  5/5 strength in PF and DF of the toes.  No lymphadenopathy.  L wrist with gross deformity of shortening and apex ulnar angulation.  2+ radial and ulnar pulses.  Normal motor and sensory fxn in radial, ulnar and median nerve distributions.  Assessment/Plan 1.  L ankle trimal fracture - fracture splinted tonight.  To OR in the am for ORIF.  The risks and benefits of the alternative treatment options have been discussed in detail.  The patient wishes to proceed with surgery and specifically understands risks of bleeding, infection, nerve damage, blood clots, need for additional surgery, amputation and death.  2.  L distal radius fracture - fracture splinted tonight.  To OR in the AM for ORIF.  I'll admit her for elevation overnight.  Toni Arthurs 05/12/2012, 12:31 AM

## 2012-05-13 ENCOUNTER — Inpatient Hospital Stay (HOSPITAL_COMMUNITY): Payer: Self-pay

## 2012-05-13 DIAGNOSIS — R55 Syncope and collapse: Secondary | ICD-10-CM

## 2012-05-13 DIAGNOSIS — F1020 Alcohol dependence, uncomplicated: Secondary | ICD-10-CM

## 2012-05-13 DIAGNOSIS — S82853A Displaced trimalleolar fracture of unspecified lower leg, initial encounter for closed fracture: Principal | ICD-10-CM

## 2012-05-13 LAB — URINALYSIS, MICROSCOPIC ONLY
Bilirubin Urine: NEGATIVE
Nitrite: NEGATIVE
Specific Gravity, Urine: 1.016 (ref 1.005–1.030)
pH: 6.5 (ref 5.0–8.0)

## 2012-05-13 MED ORDER — METHOCARBAMOL 500 MG PO TABS
ORAL_TABLET | ORAL | Status: AC
  Filled 2012-05-13: qty 1

## 2012-05-13 NOTE — Op Note (Signed)
NAME:  Hagins, Evie                 ACCOUNT NO.:  [id number]  MEDICAL RECORD NO.:  [id number]  LOCATION:  5N31C                        FACILITY:  [facility]  PHYSICIAN:  Toni Arthurs, MD        DATE OF BIRTH:  [birth year]  DATE OF PROCEDURE:  05/12/2012 DATE OF DISCHARGE:                              OPERATIVE REPORT   PREOPERATIVE DIAGNOSIS:  Left ankle trimalleolar fracture.  POSTOPERATIVE DIAGNOSIS:  Left ankle trimalleolar fracture.  PROCEDURES: 1. Open reduction and internal fixation of left ankle trimalleolar     fracture with fixation of the posterior malleolus fracture. 2. Intraoperative interpretation of fluoroscopic imaging. 3. Stress examination of left ankle under fluoroscopy.  SURGEON:  Toni Arthurs, MD  ANESTHESIA:  General.  ESTIMATED BLOOD LOSS:  Minimal.  TOURNIQUET TIME:  54 minutes at 250 mmHg.  COMPLICATIONS:  None apparent.  DISPOSITION:  Extubated, awake and stable to recovery.  INDICATION FOR PROCEDURE:  The patient is a 61 year old female with past medical history significant for alcoholism and cigarette smoking, who fell at home sustaining fractures to the left distal radius and the left ankle.  She presents now for operative treatment of these displaced unstable fractures.  She understands the risks and benefits of the alternative treatment options and elects surgical treatment.  She specifically understands the risks of bleeding, infection, nerve damage, blood clots, need for additional surgery, amputation, and death.  PROCEDURE IN DETAIL:  After preoperative consent was obtained and the correct operative site was identified, the patient was brought to the operating room and placed supine on the operating table.  General anesthesia was induced.  Preoperative antibiotics were administered. Surgical time-out was taken.  The left upper extremity fracture was being addressed by Dr. Magnus Ivan while simultaneously I was operating on her left lower  extremity.  The left lower extremity was prepped and draped in standard sterile fashion with a tourniquet around the thigh.  The extremity was exsanguinated and then tourniquet was inflated to 250 mmHg.  A longitudinal incision was made over the lateral malleolus.  Sharp dissection was carried down through the skin and subcutaneous tissue, care to taken to protect branches of the superficial peroneal nerve. Fracture was identified, it was cleaned of all hematoma and periosteum and irrigated copiously, it was reduced and held with a tenaculum.  A 3.5-mm fully-threaded lag screw was inserted from posterior to anterior compressing the fracture appropriately.  A 6-hole one-third tubular plate was then contoured to fit the lateral malleolus, it was fixed proximally with three bicortical 3.5-mm fully-threaded screws and distally with three unicortical screws.  Attention was then turned to the medial malleolus.  Two K-wires were inserted percutaneously. Appropriate reduction and position of the wires was confirmed on fluoroscopy in the AP and lateral planes.  A 44-mm cannulated screw was then inserted through a stab incision over the guidepin.  Placing a second screw was impossible due to the patient's poor bone quality.  The second screw fractured out through the medial cortex.  This screw was removed.  Fixation was adequate with the single screw, so this was left in place.  Attention was then turned to the posterior malleolus fragment.  A  Weber tenaculum and a Therapist, nutritional were used to manipulate the fragment to reduced position.  It was held with the tenaculum while the guidepin was inserted across the fracture site.  A stab incision was made and a 4-0 cannulated screw was inserted over the guidepin taking care to protect the neurovascular bundle.  The final AP, lateral and mortise views were obtained showing appropriate reduction of all three fractures and appropriate position and length  of all hardware. Mortise view was then obtained.  Dorsiflexion of the external rotation stress were applied showing no evidence of any instability at the ankle mortise.  All three wounds were irrigated correctly.  Horizontal mattress sutures of 3-0 nylon were used to close the stab incisions. Inverted simple sutures of 0 Vicryl were used to close the deep subcutaneous tissue over the plate.  The superficial subcutaneous tissue was approximated with inverted simple sutures of 3-0 Monocryl and a running 3-0 nylon was used to close the skin incision.  Sterile dressings were applied followed by well-padded short-leg splint.  The tourniquet was released at 54 minutes after application of the dressings.  The patient was then awakened from anesthesia and transported to the recovery room in stable condition.  FOLLOWUP PLAN:  The patient will be nonweightbearing on her left lower extremity.  She will bear weight through her forearm on the left upper extremity.  She will have a Hospitalist consult for DT prevention and general medical care.  She will have PT, OT and Case Management consults for likely need of skilled nursing placement.     Toni Arthurs, MD     JH/MEDQ  D:  05/12/2012  T:  05/13/2012  Job:  [id number]

## 2012-05-13 NOTE — ED Provider Notes (Signed)
Medical screening examination/treatment/procedure(s) were performed by non-physician practitioner and as supervising physician I was immediately available for consultation/collaboration.  Doyle Tegethoff M Tanysha Quant, MD 05/13/12 1628 

## 2012-05-13 NOTE — Consult Note (Signed)
[facility] History and Physical   Taylor Brown ZOX:[id number] DOB: 03-16-1951 DOA: 05/11/2012   Referring physician: Toni Arthurs, MD   PCP: Sheila Oats, MD    Chief Complaint: medical management of alcohol dependence   HPI:   61 y/o female with PMH of smoking and etoh abuse fell yesterday at home while walking in her driveway. She c/o pain in the left wrist and ankle. She is stattus post left ankle and wrist ORIF .  No h/o injury or surgery to either ankle or wrist in the past. Smokes 1.5 ppd. Pt had a fall in the driveway after consuming 6 beers this evening.She drinks about the same quantity everyday.Consultation is requested for monitoring for alcohol withdrawal.            Review of Systems: negative for the following  Constitutional: Denies fever, chills, diaphoresis, appetite change and fatigue.   HEENT: Denies photophobia, eye pain, redness, hearing loss, ear pain, congestion, sore throat, rhinorrhea, sneezing, mouth sores, trouble swallowing, neck pain, neck stiffness and tinnitus.   Respiratory: Denies SOB, DOE, cough, chest tightness, and wheezing.   Cardiovascular: Denies chest pain, palpitations and leg swelling.   Gastrointestinal: Denies nausea, vomiting, abdominal pain, diarrhea, constipation, blood in stool and abdominal distention.   Genitourinary: Denies dysuria, urgency, frequency, hematuria, flank pain and difficulty urinating.   Musculoskeletal: Denies myalgias, back pain, joint swelling, arthralgias and gait problem.   Skin: Denies pallor, rash and wound.   Neurological: Denies dizziness, seizures, syncope, weakness, light-headedness, numbness and headaches.   Hematological: Denies adenopathy. Easy bruising, personal or family bleeding history   Psychiatric/Behavioral: Denies suicidal ideation, mood changes, confusion, nervousness, sleep disturbance and agitation            History reviewed. No pertinent past medical history.     Past Surgical History   Procedure  Date   .  Appendectomy     .  Tubal ligation            Social History: reports that she has been smoking Cigarettes.  She has been smoking about 1 pack per day. She does not have any smokeless tobacco history on file. She reports that she drinks alcohol. She reports that she does not use illicit drugs.       No Known Allergies   Family hx revived and found to be negative      Prior to Admission medications    Medication  Sig  Start Date  End Date  Taking?  Authorizing Provider   loratadine (CLARITIN) 10 MG tablet  Take 10 mg by mouth daily as needed. For allergies      Yes  Historical Provider, MD   omeprazole (PRILOSEC) 20 MG capsule  Take 20 mg by mouth daily.      Yes  Historical Provider, MD          Physical Exam: Filed Vitals:     05/12/12 1145  05/12/12 1200  05/12/12 1215  05/12/12 1230   BP:  139/73  136/75  146/72  123/67   Pulse:  80  76  77  76   Temp:        98.1 F (36.7 C)   TempSrc:           Resp:  15  12  20  18    Height:           Weight:           SpO2:  97%  96%  97%  97%       Constitutional: Vital signs reviewed. Patient is a well-developed and well-nourished in no acute distress and cooperative with exam. Alert and oriented x3.   Head: Normocephalic and atraumatic   Ear: TM normal bilaterally   Mouth: no erythema or exudates, MMM   Eyes: PERRL, EOMI, conjunctivae normal, No scleral icterus.   Neck: Supple, Trachea midline normal ROM, No JVD, mass, thyromegaly, or carotid bruit present.   Cardiovascular: RRR, S1 normal, S2 normal, no MRG, pulses symmetric and intact bilaterally   Pulmonary/Chest: CTAB, no wheezes, rales, or rhonchi   Abdominal: Soft. Non-tender, non-distended, bowel sounds are normal, no masses, organomegaly, or guarding present.   GU: no CVA tenderness Musculoskeletal: No joint deformities, erythema, or stiffness, ROM full and no nontender Ext: no edema and no cyanosis, pulses  palpable bilaterally (DP and PT)   Hematology: no cervical, inginal, or axillary adenopathy.  Neurological: A&O x3, Strenght is normal and symmetric bilaterally, cranial nerve II-XII are grossly intact, no focal motor deficit, sensory intact to light touch bilaterally.   Skin: Warm, dry and intact. No rash, cyanosis, or clubbing.  Psychiatric: Normal mood and affect. speech and behavior is normal. Judgment and thought content normal. Cognition and memory are normal.            Labs on Admission:      Basic Metabolic Panel: Lab  05/11/12 1610   NA  135   K  3.6   CL  101   CO2  19   GLUCOSE  102*   BUN  6   CREATININE  0.43*   CALCIUM  9.0   MG  --   PHOS  --    Liver Function Tests: No results found for this basename: AST:5,ALT:5,ALKPHOS:5,BILITOT:5,PROT:5,ALBUMIN:5 in the last 168 hours No results found for this basename: LIPASE:5,AMYLASE:5 in the last 168 hours No results found for this basename: AMMONIA:5 in the last 168 hours CBC: Lab  05/11/12 2142   WBC  11.2*   NEUTROABS  9.1*   HGB  14.4   HCT  41.4   MCV  92.2   PLT  225    Cardiac Enzymes: No results found for this basename: CKTOTAL:5,CKMB:5,CKMBINDEX:5,TROPONINI:5 in the last 168 hours   BNP (last 3 results) No results found for this basename: PROBNP:3 in the last 8760 hours       CBG: Lab  05/11/12 2016   GLUCAP  95      Radiological Exams on Admission: Dg Wrist Complete Left   05/11/2012  *RADIOLOGY REPORT*  Clinical Data: Left wrist pain and deformity after fall.  LEFT WRIST - COMPLETE 3+ VIEW  Comparison: None.  Findings: Comminuted fractures of the distal left radial metaphysis with volar displacement, angulation, and overriding of fracture fragments.  Fracture lines appear to extend to the radial ulnar joint.  No definite extension to the radial carpal joint. No associated ulnar styloid process fracture.  Diffuse soft tissue swelling.  Underlying degenerative changes are apparent in  the STT and first carpometacarpal joints.  No focal bone lesions appreciated.  IMPRESSION: Comminuted fractures of the distal left radius.   Original Report Authenticated By: Burman Nieves, M.D.     Dg Ankle Complete Left   05/12/2012  *RADIOLOGY REPORT*  Clinical Data: Follow-up ankle fracture.  LEFT ANKLE COMPLETE - 3+ VIEW  Comparison: 05/11/2012  Findings: There is been interval placement of a fixation screws through the medial and posterior malleoli in the distal tibia, as well as a  fixation plate and screws across a distal fibular fracture.  Fracture fragments are in anatomic alignment, and the talus is centered within the ankle mortise.  IMPRESSION: Internal fixation of trimalleolar ankle fracture in anatomic alignment.   Original Report Authenticated By: Myles Rosenthal, M.D.     Dg Ankle Complete Left   05/11/2012  *RADIOLOGY REPORT*  Clinical Data: Left ankle pain and deformity after fall.  LEFT ANKLE COMPLETE - 3+ VIEW  Comparison: None.  Findings: There are comminuted trimalleolar fractures of the left ankle with mostly transverse fracture of the medial malleolus, oblique fracture of the lateral malleolus, and coronal fracture of the posterior malleolus.  There is a lateral displacement and angulation of the distal fracture fragments with lateral displacement of the tibiotalar joint.  There is a posterior and superior displacement the tibiotalar joint with superior displacement of the posterior malleolar fracture fragment.  Soft tissue swelling.  No discrete bone lesions are appreciated.  IMPRESSION: Comminuted and displaced trimalleolar fractures the left ankle.   Original Report Authenticated By: Burman Nieves, M.D.      EKG: Independently reviewed. pending   Assessment/Plan    Alcohol dependence , will start CIWA protocol, folic acid ,thiamine, check LFT's   S/P fall , given alcohol intoxication, and dependence, will check, ekg, 2 d echo, tele     Code Status:   full Family  Communication: bedside Disposition Plan: admit    Time spent: 70 mins     [facility] Pager [phone]   If 7PM-7AM, please contact night-coverage [email or web address] Password [email or web address] 05/12/2012, 1:10 PM

## 2012-05-13 NOTE — Evaluation (Addendum)
Occupational Therapy Evaluation Patient Details Name: Taylor Brown MRN: [id number] DOB: 26-Aug-1950 Today's Date: 05/13/2012 Time: 4098-1191 OT Time Calculation (min): 26 min  OT Assessment / Plan / Recommendation Clinical Impression  Pt admitted with left distal radial fx and left trimalleolar fx s/p fall in driveway. Hx of ETOH abuse. Pt now s/p ORIF of left wrist and ankle.   Pt will benefit from acute OT services to address below problem list in prep for d/c home with HHOT.    OT Assessment  Patient needs continued OT Services    Follow Up Recommendations  Home health OT;Supervision/Assistance - 24 hour    Barriers to Discharge      Equipment Recommendations  Rolling walker with 5" wheels;3 in 1 bedside comode;Tub/shower bench;Other (comment) (left platform attachment for RW)    Recommendations for Other Services    Frequency  Min 2X/week    Precautions / Restrictions Restrictions Weight Bearing Restrictions: Yes LUE Weight Bearing: Weight bear through elbow only LLE Weight Bearing: Non weight bearing   Pertinent Vitals/Pain See vitals    ADL  Eating/Feeding: Performed;Set up Where Assessed - Eating/Feeding: Chair Upper Body Dressing: Performed;Minimal assistance Where Assessed - Upper Body Dressing: Unsupported sitting Lower Body Dressing: Performed;Maximal assistance Where Assessed - Lower Body Dressing: Supported sit to stand Toilet Transfer: Simulated;Moderate assistance Toilet Transfer Method: Sit to Barista: Other (comment) (chair) Equipment Used: Gait belt (platform walker) Transfers/Ambulation Related to ADLs: Mod assist for sit<>stand with platform walker.  Pt able to take 2-3 hops using platform walker and mod assist but then impulsively sat back down in chair behind her. ADL Comments: Educated pt on dressing technique of threading LUE and LLE first through clothing.  Pt with soft wrist splint on LUE.     OT Diagnosis: Generalized  weakness;Acute pain  OT Problem List: Impaired balance (sitting and/or standing);Decreased knowledge of use of DME or AE;Impaired UE functional use;Pain;Impaired sensation OT Treatment Interventions: Self-care/ADL training;DME and/or AE instruction;Therapeutic activities;Patient/family education;Balance training   OT Goals Acute Rehab OT Goals OT Goal Formulation: With patient Time For Goal Achievement: 05/27/12 Potential to Achieve Goals: Good ADL Goals Pt Will Perform Upper Body Bathing: with set-up;Sitting, chair;Sitting, edge of bed ADL Goal: Upper Body Bathing - Progress: Goal set today Pt Will Perform Lower Body Bathing: with set-up;Sit to stand from chair;Sit to stand from bed ADL Goal: Lower Body Bathing - Progress: Goal set today Pt Will Perform Upper Body Dressing: with set-up;Sitting, chair;Sitting, bed ADL Goal: Upper Body Dressing - Progress: Goal set today Pt Will Perform Lower Body Dressing: with set-up;Sit to stand from chair;Sit to stand from bed ADL Goal: Lower Body Dressing - Progress: Goal set today Pt Will Transfer to Toilet: Stand pivot transfer;with DME;3-in-1;Maintaining weight bearing status;with supervision ADL Goal: Toilet Transfer - Progress: Goal set today Pt Will Perform Toileting - Clothing Manipulation: with set-up;Standing ADL Goal: Toileting - Clothing Manipulation - Progress: Goal set today Pt Will Perform Toileting - Hygiene: with set-up;Sit to stand from 3-in-1/toilet;Standing at 3-in-1/toilet ADL Goal: Toileting - Hygiene - Progress: Goal set today Pt Will Perform Tub/Shower Transfer: Tub transfer;with supervision;Stand pivot transfer;with DME;Transfer tub bench;Maintaining weight bearing status ADL Goal: Tub/Shower Transfer - Progress: Goal set today Miscellaneous OT Goals Miscellaneous OT Goal #1: Pt will independently perform shoulder, elbow, and digt AROM. OT Goal: Miscellaneous Goal #1 - Progress: Goal set today  Visit Information  Last OT  Received On: 05/13/12 Assistance Needed: +2    Subjective Data  Prior Functioning     Home Living Lives With: Spouse Available Help at Discharge: Family;Available 24 hours/day Type of Home: Mobile home Home Access: Stairs to enter Entrance Stairs-Number of Steps: 2 Entrance Stairs-Rails: Right;Left Home Layout: One level Bathroom Shower/Tub: Engineer, manufacturing systems: Standard Home Adaptive Equipment: None Prior Function Level of Independence: Independent Able to Take Stairs?: Yes Driving: Yes Communication Communication: No difficulties         Vision/Perception     Cognition  Overall Cognitive Status: Appears within functional limits for tasks assessed/performed Arousal/Alertness: Awake/alert Orientation Level: Oriented X4 / Intact Behavior During Session: [facility] for tasks performed    Extremity/Trunk Assessment Right Upper Extremity Assessment RUE ROM/Strength/Tone: Within functional levels Left Upper Extremity Assessment LUE ROM/Strength/Tone: Unable to fully assess;Due to precautions;Deficits LUE ROM/Strength/Tone Deficits: Wrist not tested. Shoulder and elbow ROM WFL. Pt able to flex/extend digits but with increased effort.  LUE Sensation: Deficits LUE Sensation Deficits: Digits feel slightly numb LUE Coordination: Deficits LUE Coordination Deficits: Fine motor deficit     Mobility Bed Mobility Bed Mobility: Not assessed Supine to Sit: 4: Min assist Details for Bed Mobility Assistance: verbal cues for sequencing Transfers Transfers: Sit to Stand;Stand to Sit Sit to Stand: 3: Mod assist;From chair/3-in-1;With armrests;With upper extremity assist Stand to Sit: 3: Mod assist;To chair/3-in-1;With armrests;With upper extremity assist Details for Transfer Assistance: verbal cues for sequencing. assist to control descent.     Shoulder Instructions     Exercise     Balance     End of Session OT - End of Session Equipment Utilized During  Treatment: Gait belt Activity Tolerance: Patient tolerated treatment well Patient left: in chair;with call bell/phone within reach Nurse Communication: Mobility status  GO    05/13/2012 Cipriano Mile OTR/L Pager [phone] Office [phone]  Cipriano Mile 05/13/2012, 2:04 PM

## 2012-05-13 NOTE — Progress Notes (Signed)
  Echocardiogram 2D Echocardiogram has been performed.  Taylor Brown 05/13/2012, 12:46 PM

## 2012-05-13 NOTE — Evaluation (Signed)
Physical Therapy Evaluation Patient Details Name: Taylor Brown MRN: [id number] DOB: 12-Feb-1951 Today's Date: 05/13/2012 Time: 7829-5621 PT Time Calculation (min): 23 min  PT Assessment / Plan / Recommendation Clinical Impression  Acute PT intervention indicated for mobility training and education to attain max functional level prior to d/c home with husband.    PT Assessment  Patient needs continued PT services    Follow Up Recommendations  Home health PT    Does the patient have the potential to tolerate intense rehabilitation      Barriers to Discharge   Pt's desire is to return home with husband.  She has 2 steps to enter home.    Equipment Recommendations  Wheelchair (measurements);Rolling walker with 5" wheels;Other (comment);3 in 1 bedside comode (L platform attatchment for RW)    Recommendations for Other Services     Frequency Min 6X/week    Precautions / Restrictions Restrictions LUE Weight Bearing: Weight bear through elbow only LLE Weight Bearing: Non weight bearing   Pertinent Vitals/Pain       Mobility  Bed Mobility Bed Mobility: Supine to Sit Supine to Sit: 4: Min assist Details for Bed Mobility Assistance: verbal cues for sequencing Transfers Transfers: Sit to Stand;Stand to Sit Sit to Stand: 3: Mod assist;From bed;With upper extremity assist Stand to Sit: 4: Min assist;To chair/3-in-1;With armrests Details for Transfer Assistance: verbal cues for sequencing Ambulation/Gait Ambulation/Gait Assistance: 3: Mod assist Ambulation Distance (Feet): 3 Feet Assistive device: Left platform walker Ambulation/Gait Assistance Details: verbal/tactile cues for platform RW management Gait Pattern: Step-to pattern;Antalgic Gait velocity: decreased    Shoulder Instructions     Exercises     PT Diagnosis: Difficulty walking;Acute pain  PT Problem List: Decreased activity tolerance;Decreased mobility;Decreased knowledge of use of DME;Pain PT Treatment  Interventions: DME instruction;Gait training;Functional mobility training;Therapeutic activities;Patient/family education;Wheelchair mobility training   PT Goals Acute Rehab PT Goals PT Goal Formulation: With patient Time For Goal Achievement: 05/20/12 Potential to Achieve Goals: Good Pt will go Supine/Side to Sit: with modified independence PT Goal: Supine/Side to Sit - Progress: Goal set today Pt will go Sit to Supine/Side: with modified independence PT Goal: Sit to Supine/Side - Progress: Goal set today Pt will go Sit to Stand: with supervision PT Goal: Sit to Stand - Progress: Goal set today Pt will go Stand to Sit: with supervision PT Goal: Stand to Sit - Progress: Goal set today Pt will Transfer Bed to Chair/Chair to Bed: with supervision PT Transfer Goal: Bed to Chair/Chair to Bed - Progress: Goal set today Pt will Ambulate: 16 - 50 feet;with rolling walker;with other equipment (comment);with min assist (L platform attatchment on RW) PT Goal: Ambulate - Progress: Goal set today Pt will Propel Wheelchair: 10 - 50 feet;with modified independence PT Goal: Propel Wheelchair - Progress: Goal set today Additional Goals Additional Goal #1: Spouse/family independent with assisting pt up/down steps in w/c. PT Goal: Additional Goal #1 - Progress: Goal set today  Visit Information  Last PT Received On: 05/13/12 Assistance Needed: +2    Subjective Data  Subjective: "My hand hurts more than my leg." Patient Stated Goal: home   Prior Functioning  Home Living Lives With: Spouse Available Help at Discharge: Family;Available 24 hours/day Type of Home: Mobile home Home Access: Stairs to enter Entrance Stairs-Number of Steps: 2 Entrance Stairs-Rails: Right;Left Home Layout: One level Bathroom Toilet: Standard Home Adaptive Equipment: None Prior Function Level of Independence: Independent Able to Take Stairs?: Yes Driving: Yes Communication Communication: No difficulties  Cognition  Overall Cognitive Status: Appears within functional limits for tasks assessed/performed Arousal/Alertness: Awake/alert Orientation Level: Oriented X4 / Intact Behavior During Session: [facility] for tasks performed    Extremity/Trunk Assessment     Balance    End of Session PT - End of Session Equipment Utilized During Treatment: Gait belt Activity Tolerance: Patient limited by pain Patient left: in chair;with call bell/phone within reach;with nursing in room Nurse Communication: Mobility status  GP     Ilda Foil 05/13/2012, 1:01 PM  Aida Raider, PT  Office # [phone] Pager [phone]

## 2012-05-13 NOTE — Progress Notes (Signed)
Agree with above.  JDH 

## 2012-05-13 NOTE — Progress Notes (Signed)
Subjective: Pt feeling better today, left wrist sorer than left ankle. No SOB,CP, or nausea.  Objective: Vital signs in last 24 hours: Temp:  [97.7 F (36.5 C)-101.7 F (38.7 C)] 97.7 F (36.5 C) (11/03 0731) Pulse Rate:  [72-116] 116  (11/03 0731) Resp:  [12-22] 18  (11/03 0731) BP: (121-154)/(58-85) 121/58 mmHg (11/03 0731) SpO2:  [94 %-99 %] 94 % (11/03 0731)  Intake/Output from previous day: 11/02 0701 - 11/03 0700 In: 1700 [I.V.:1700] Out: 3950 [Urine:3850; Blood:100] Intake/Output this shift:     Basename 05/11/12 2142  HGB 14.4    Basename 05/11/12 2142  WBC 11.2*  RBC 4.49  HCT 41.4  PLT 225    Basename 05/11/12 2142  NA 135  K 3.6  CL 101  CO2 19  BUN 6  CREATININE 0.43*  GLUCOSE 102*  CALCIUM 9.0   No results found for this basename: LABPT:2,INR:2 in the last 72 hours  Cons,alert,appropriate, left arm elevated on pillow above heart, slight swelling of hand,but good motor and sense and brisk cap refill. Wrist in brace. Left foot elevated on pillow and in splinting, toes warm,good motor,good sense, no swelling.  Assessment/Plan: Post op Left ankle ORIF doing well Post op Lfet wrist ORIF ding well with some hand swelling.  Plan Out of bed with PT Non-weightbearing left foot, Pump hand and elevate left arm for swelling, I/S prevent respiratory infection. Possible walker with left arm platform for ambulation if OK with Dr Beckey Downing 05/13/2012, 9:12 AM

## 2012-05-13 NOTE — Progress Notes (Signed)
[facility] PROGRESS NOTE  Taylor KRITZ WUJ:[id number] DOB: 16-Feb-1951 DOA: 05/11/2012 PCP: Sheila Oats, MD  Assessment/Plan:  1. Alcohol dependence hospitalist consult was requested for monitoring of withdrawal symptoms, started on folic acid, thiamine, CIWA protocol. May have a component of underlying depression, reports problems with her husband. Will benefit from outpatient psychiatric evaluation. Started on telemetry,  2.   Fall Post op Left ankle ORIF doing well ,Post op Lfet wrist ORIF per orthopedics, anticoagulation per orthopedics, telemetry for 24 was, 2-D echo  3. Fever, postoperative, blood chest x-ray, UA, incentive spirometry  Code Status: full Family Communication: family updated about patient's clinical progress Disposition Plan:  As above    Brief narrative: 61 y/o female with PMH of smoking and etoh abuse fell yesterday at home while walking in her driveway. She c/o pain in the left wrist and ankle. She is stattus post left ankle and wrist ORIF . No h/o injury or surgery to either ankle or wrist in the past. Smokes 1.5 ppd. Pt had a fall in the driveway after consuming 6 beers this evening.She drinks about the same quantity everyday.Consultation is requested for monitoring for alcohol withdrawal   Consultants:  Orthopedics  Procedures:  As above  Antibiotics:  None  HPI/Subjective: Stable overnight except for low-grade fever  Objective: Filed Vitals:   05/12/12 2049 05/13/12 0022 05/13/12 0403 05/13/12 0731  BP: 144/69 147/77 154/69 121/58  Pulse: 106 104 108 116  Temp: 99.8 F (37.7 C) 99.9 F (37.7 C) 100.1 F (37.8 C) 97.7 F (36.5 C)  TempSrc: Oral Oral Oral Oral  Resp: 18 18 20 18   Height:      Weight:      SpO2: 97% 97% 96% 94%    Intake/Output Summary (Last 24 hours) at 05/13/12 0933 Last data filed at 05/13/12 0700  Gross per 24 hour  Intake   1700 ml  Output   3600 ml  Net  -1900 ml    Exam:  HENT:  Head:  Atraumatic.  Nose: Nose normal.  Mouth/Throat: Oropharynx is clear and moist.  Eyes: Conjunctivae are normal. Pupils are equal, round, and reactive to light. No scleral icterus.  Neck: Neck supple. No tracheal deviation present.  Cardiovascular: Normal rate, regular rhythm, normal heart sounds and intact distal pulses.  Pulmonary/Chest: Effort normal and breath sounds normal. No respiratory distress.  Abdominal: Soft. Normal appearance and bowel sounds are normal. She exhibits no distension. There is no tenderness.  Musculoskeletal: She exhibits no edema and no tenderness.  Neurological: She is alert. No cranial nerve deficit.    Data Reviewed: Basic Metabolic Panel:  Lab 05/11/12 9562  NA 135  K 3.6  CL 101  CO2 19  GLUCOSE 102*  BUN 6  CREATININE 0.43*  CALCIUM 9.0  MG --  PHOS --    Liver Function Tests:  Lab 05/12/12 1339  AST 21  ALT 12  ALKPHOS 64  BILITOT 0.5  PROT 6.0  ALBUMIN 3.2*   No results found for this basename: LIPASE:5,AMYLASE:5 in the last 168 hours No results found for this basename: AMMONIA:5 in the last 168 hours  CBC:  Lab 05/11/12 2142  WBC 11.2*  NEUTROABS 9.1*  HGB 14.4  HCT 41.4  MCV 92.2  PLT 225    Cardiac Enzymes: No results found for this basename: CKTOTAL:5,CKMB:5,CKMBINDEX:5,TROPONINI:5 in the last 168 hours BNP (last 3 results) No results found for this basename: PROBNP:3 in the last 8760 hours   CBG:  Lab 05/11/12 2016  GLUCAP 95    Recent Results (from the past 240 hour(s))  SURGICAL PCR SCREEN     Status: Normal   Collection Time   05/12/12  5:09 AM      Component Value Range Status Comment   MRSA, PCR NEGATIVE  NEGATIVE Final    Staphylococcus aureus NEGATIVE  NEGATIVE Final      Studies: Dg Wrist Complete Left  05-16-12  *RADIOLOGY REPORT*  Clinical Data: Left wrist pain and deformity after fall.  LEFT WRIST - COMPLETE 3+ VIEW  Comparison: None.  Findings: Comminuted fractures of the distal left radial  metaphysis with volar displacement, angulation, and overriding of fracture fragments.  Fracture lines appear to extend to the radial ulnar joint.  No definite extension to the radial carpal joint. No associated ulnar styloid process fracture.  Diffuse soft tissue swelling.  Underlying degenerative changes are apparent in the STT and first carpometacarpal joints.  No focal bone lesions appreciated.  IMPRESSION: Comminuted fractures of the distal left radius.   Original Report Authenticated By: Burman Nieves, M.D.    Dg Ankle Complete Left  05/12/2012  *RADIOLOGY REPORT*  Clinical Data: Follow-up ankle fracture.  LEFT ANKLE COMPLETE - 3+ VIEW  Comparison: 16-May-2012  Findings: There is been interval placement of a fixation screws through the medial and posterior malleoli in the distal tibia, as well as a fixation plate and screws across a distal fibular fracture.  Fracture fragments are in anatomic alignment, and the talus is centered within the ankle mortise.  IMPRESSION: Internal fixation of trimalleolar ankle fracture in anatomic alignment.   Original Report Authenticated By: Myles Rosenthal, M.D.    Dg Ankle Complete Left  05-16-12  *RADIOLOGY REPORT*  Clinical Data: Left ankle pain and deformity after fall.  LEFT ANKLE COMPLETE - 3+ VIEW  Comparison: None.  Findings: There are comminuted trimalleolar fractures of the left ankle with mostly transverse fracture of the medial malleolus, oblique fracture of the lateral malleolus, and coronal fracture of the posterior malleolus.  There is a lateral displacement and angulation of the distal fracture fragments with lateral displacement of the tibiotalar joint.  There is a posterior and superior displacement the tibiotalar joint with superior displacement of the posterior malleolar fracture fragment.  Soft tissue swelling.  No discrete bone lesions are appreciated.  IMPRESSION: Comminuted and displaced trimalleolar fractures the left ankle.   Original Report  Authenticated By: Burman Nieves, M.D.     Scheduled Meds:   .  ceFAZolin (ANCEF) IV  2 g Intravenous 60 min Pre-Op  . [COMPLETED]  ceFAZolin (ANCEF) IV  2 g Intravenous Q6H  . docusate sodium  100 mg Oral BID  . enoxaparin (LOVENOX) injection  40 mg Subcutaneous Q24H  . folic acid  1 mg Oral Daily  . [EXPIRED] HYDROmorphone      . [EXPIRED] methocarbamol      . methocarbamol      . multivitamin with minerals  1 tablet Oral Daily  . nicotine  21 mg Transdermal Daily  . [EXPIRED] oxyCODONE      . pantoprazole  40 mg Oral Daily  . senna  1 tablet Oral BID  . thiamine  100 mg Oral Daily  . [DISCONTINUED] thiamine  100 mg Intravenous Daily   Continuous Infusions:   . sodium chloride 100 mL/hr at 05/12/12 0450  . sodium chloride 100 mL/hr (05/13/12 0004)    Active Problems:  * No active hospital problems. *     Time spent: 40 minutes  [facility]  [facility] Pager [phone]. If 8PM-8AM, please contact night-coverage at [email or web address], password [facility] 05/13/2012, 9:33 AM  LOS: 2 days

## 2012-05-14 ENCOUNTER — Encounter (HOSPITAL_COMMUNITY): Payer: Self-pay | Admitting: General Practice

## 2012-05-14 DIAGNOSIS — S52599A Other fractures of lower end of unspecified radius, initial encounter for closed fracture: Secondary | ICD-10-CM

## 2012-05-14 DIAGNOSIS — F1020 Alcohol dependence, uncomplicated: Secondary | ICD-10-CM

## 2012-05-14 LAB — CBC
Platelets: 186 10*3/uL (ref 150–400)
RBC: 3.81 MIL/uL — ABNORMAL LOW (ref 3.87–5.11)
WBC: 8.7 10*3/uL (ref 4.0–10.5)

## 2012-05-14 MED ORDER — ADULT MULTIVITAMIN W/MINERALS CH: 1.0000 | ORAL_TABLET | Freq: Every day | ORAL | Status: AC

## 2012-05-14 MED ORDER — CIPROFLOXACIN HCL 500 MG PO TABS
500.0000 mg | ORAL_TABLET | Freq: Two times a day (BID) | ORAL | Status: DC
  Administered 2012-05-14: 500 mg via ORAL
  Filled 2012-05-14 (×4): qty 1

## 2012-05-14 MED ORDER — CIPROFLOXACIN HCL 500 MG PO TABS: 500.0000 mg | ORAL_TABLET | Freq: Two times a day (BID) | ORAL | Status: DC

## 2012-05-14 MED ORDER — DSS 100 MG PO CAPS: 100.0000 mg | ORAL_CAPSULE | Freq: Two times a day (BID) | ORAL | Status: AC

## 2012-05-14 MED ORDER — SENNA 8.6 MG PO TABS: 1.0000 | ORAL_TABLET | Freq: Two times a day (BID) | ORAL | Status: AC

## 2012-05-14 MED ORDER — INFLUENZA VIRUS VACC SPLIT PF IM SUSP
0.5000 mL | INTRAMUSCULAR | Status: AC
  Administered 2012-05-14: 0.5 mL via INTRAMUSCULAR
  Filled 2012-05-14: qty 0.5

## 2012-05-14 MED ORDER — ENOXAPARIN SODIUM 40 MG/0.4ML ~~LOC~~ SOLN: 40.0000 mg | SUBCUTANEOUS | Status: DC

## 2012-05-14 MED ORDER — INFLUENZA VIRUS VACC SPLIT PF IM SUSP: 0.5000 mL | INTRAMUSCULAR | Status: DC

## 2012-05-14 MED ORDER — OXYCODONE HCL 5 MG PO TABS: 5.0000 mg | ORAL_TABLET | ORAL | Status: DC | PRN

## 2012-05-14 NOTE — Progress Notes (Signed)
Patient ID: Taylor Brown, female   DOB: Sep 01, 1950, 61 y.o.   MRN: [id number] Right wrist/hand looks good.  She moves her fingers and thumb.  I changed her dressing and placed her back in a removable splint.  She can use a platform walker thru her right forearm.

## 2012-05-14 NOTE — Progress Notes (Signed)
Physical Therapy Treatment Patient Details Name: Taylor Brown MRN: [id number] DOB: 01-Jan-1951 Today's Date: 05/14/2012 Time: 5621-3086 PT Time Calculation (min): 33 min  PT Assessment / Plan / Recommendation Comments on Treatment Session  Patient progressing with mobility. She appears somewhat unconcerned about her situation. Patient plans to discharge home today with her husband. Patient educated on all safety and NWB status's. Patient completed stair education but has difficulting completing stairs with RW and needs wheelchair in order to complete stairs safety. Patient given handout for steps with wheelchair and states she will have husband find someone to help them get into the house. I am unsure how compliant paitent will be at home. Would recommend follow up HHPT.     Follow Up Recommendations  Home health PT     Does the patient have the potential to tolerate intense rehabilitation     Barriers to Discharge        Equipment Recommendations  Rolling walker with 5" wheels;Wheelchair cushion (measurements);Wheelchair (measurements);3 in 1 bedside comode;Other (comment) (L platform attach for RW and elevation R legrest for WC)    Recommendations for Other Services    Frequency Min 6X/week   Plan Frequency remains appropriate;Discharge plan remains appropriate    Precautions / Restrictions Precautions Precautions: Fall Restrictions LUE Weight Bearing: Non weight bearing LLE Weight Bearing: Non weight bearing   Pertinent Vitals/Pain     Mobility  Bed Mobility Supine to Sit: 6: Modified independent (Device/Increase time) Transfers Sit to Stand: With upper extremity assist;4: Min assist;From bed;From chair/3-in-1 Stand to Sit: With upper extremity assist;4: Min assist;To chair/3-in-1;To bed Details for Transfer Assistance: Cues for safe technique and WB status. A for stablity and balance Ambulation/Gait Ambulation/Gait Assistance: 4: Min guard Ambulation Distance (Feet): 20  Feet Assistive device: Rolling walker Ambulation/Gait Assistance Details: Cues for posture and safe use of RW Gait Pattern: Step-to pattern;Trunk flexed Stairs: Yes Stairs Assistance: 1: +2 Total assist;Patient percentage (comment) (80%) Stairs Assistance Details (indicate cue type and reason): Patient requiring A for RW positioning and balance. Patient cued on technique and to ensure NWB status.  Stair Management Technique: Backwards;With walker Number of Stairs: 2     Exercises     PT Diagnosis:    PT Problem List:   PT Treatment Interventions:     PT Goals Acute Rehab PT Goals PT Goal: Supine/Side to Sit - Progress: Met PT Goal: Sit to Stand - Progress: Progressing toward goal PT Goal: Stand to Sit - Progress: Progressing toward goal PT Transfer Goal: Bed to Chair/Chair to Bed - Progress: Progressing toward goal PT Goal: Ambulate - Progress: Progressing toward goal Additional Goals PT Goal: Additional Goal #1 - Progress: Progressing toward goal  Visit Information  Last PT Received On: 05/14/12 Assistance Needed: +2    Subjective Data      Cognition  Overall Cognitive Status: Appears within functional limits for tasks assessed/performed Arousal/Alertness: Awake/alert Orientation Level: Appears intact for tasks assessed Behavior During Session: [facility] for tasks performed Cognition - Other Comments: Patient with decrease safety and impulsive, however can perfrom task just requiring lots of cues for NWB on her L wrist. Patient ambulated herself back from bathroom without assistance. Educated to have A when up    Balance     End of Session PT - End of Session Equipment Utilized During Treatment: Gait belt Activity Tolerance: Patient limited by fatigue;Patient tolerated treatment well Patient left: in chair;with call bell/phone within reach Nurse Communication: Mobility status   GP  Fredrich Birks 05/14/2012, 12:15 PM 05/14/2012 Fredrich Birks  PTA [phone] pager [phone] office

## 2012-05-14 NOTE — Progress Notes (Signed)
CARE MANAGEMENT NOTE 05/14/2012  Patient:  Taylor Brown,Taylor Brown   Account Number:  [id number]  Date Initiated:  05/14/2012  Documentation initiated by:  Vance Peper  Subjective/Objective Assessment:   61 yr old female s/p ORIF of ankle fracture, ORIF of Left wrist fracture     Action/Plan:   Home with HH and DME   Anticipated DC Date:  05/14/2012   Anticipated DC Plan:  HOME W HOME HEALTH SERVICES      DC Planning Services  CM consult      PAC Choice  DURABLE MEDICAL EQUIPMENT  HOME HEALTH   Choice offered to / List presented to:     DME arranged  WALKER - PLATFORM  WHEELCHAIR - MANUAL  3-N-1      DME agency  [facility]     HH arranged  HH-1 RN  HH-2 PT      [facility] agency  [facility]   Status of service:  Completed, signed off Medicare Important Message given?   (If response is "NO", the following Medicare IM given date fields will be blank) Date Medicare IM given:   Date Additional Medicare IM given:    Discharge Disposition:  HOME W HOME HEALTH SERVICES  Per UR Regulation:    If discussed at Long Length of Stay Meetings, dates discussed:    Comments:

## 2012-05-14 NOTE — Progress Notes (Signed)
I agree with the following treatment note after reviewing documentation.   Johnston, Lovey Crupi Brynn   OTR/L Pager: [phone] Office: [phone] .   

## 2012-05-14 NOTE — Progress Notes (Signed)
[facility]                                                                                Patient Demographics  Taylor Brown, is a 61 y.o. female  ZOX:[id number]  WJX:[id number]  DOB - 16-Aug-1950  Admit date - 05/11/2012  Admitting Physician Toni Arthurs, MD  Outpatient Primary MD for the patient is DEFAULT,PROVIDER, MD  LOS - 3   Chief Complaint  Patient presents with  . Fall        Assessment & Plan     1. Left ankle and left arm fracture or orthopedics.    2. History of nicotine and alcohol abuse. Patient was counseled, no signs of active withdrawal, last drink more than 3 days ago, continue folic acid thymine along with CIWA protocol as needed.   3. One-time fever yesterday, chest x-ray stable, UA suggestive of UTI placed on Cipro 500 twice a day for 6 total doses, monitor urine cultures.    Time Spent in minutes   30   Antibiotics    Anti-infectives     Start     Dose/Rate Route Frequency Ordered Stop   05/14/12 1045   ciprofloxacin (CIPRO) tablet 500 mg        500 mg Oral 2 times daily 05/14/12 1042 05/17/12 0759   05/12/12 1600   ceFAZolin (ANCEF) IVPB 2 g/50 mL premix        2 g 100 mL/hr over 30 Minutes Intravenous Every 6 hours 05/12/12 1259 05/13/12 0529   05/12/12 0428   ceFAZolin (ANCEF) IVPB 2 g/50 mL premix  Status:  Discontinued        2 g 100 mL/hr over 30 Minutes Intravenous 60 min pre-op 05/12/12 0428 05/13/12 1032          Scheduled Meds:   . ciprofloxacin  500 mg Oral BID  . docusate sodium  100 mg Oral BID  . enoxaparin (LOVENOX) injection  40 mg Subcutaneous Q24H  . folic acid  1 mg Oral Daily  . influenza  inactive virus vaccine  0.5 mL Intramuscular Tomorrow-1000  . [EXPIRED] methocarbamol      . multivitamin with minerals  1 tablet Oral Daily  . nicotine  21 mg Transdermal Daily  . pantoprazole  40 mg Oral Daily  . senna  1 tablet Oral BID  . thiamine  100 mg Oral Daily   Continuous Infusions:   .  sodium chloride 100 mL/hr (05/13/12 0004)  . [DISCONTINUED] sodium chloride 100 mL/hr at 05/12/12 0450   PRN Meds:.acetaminophen, HYDROmorphone (DILAUDID) injection, LORazepam, LORazepam, magnesium hydroxide, methocarbamol, ondansetron (ZOFRAN) IV, oxyCODONE, [DISCONTINUED] acetaminophen, [DISCONTINUED] methocarbamol (ROBAXIN) IV, [DISCONTINUED] metoCLOPramide (REGLAN) injection, [DISCONTINUED] metoCLOPramide, [DISCONTINUED] ondansetron   DVT Prophylaxis  Lovenox   Lab Results  Component Value Date   PLT 186 05/14/2012      Susa Raring K M.D on 05/14/2012 at 10:43 AM  Between 7am to 7pm - Pager - [phone]  After 7pm go to [email or web address] - password [email or web address]  And look for the night coverage person covering for me after hours  [facility] Office  [phone]    Subjective:   Freddye Cardamone today has, No headache,  No chest pain, No abdominal pain - No Nausea, No new weakness tingling or numbness, No Cough - SOB.  Objective:   Filed Vitals:   05/13/12 0731 05/13/12 1434 05/13/12 2016 05/14/12 0612  BP: 121/58 132/79 126/76 127/65  Pulse: 116 120 91 97  Temp: 97.7 F (36.5 C) 102.3 F (39.1 C) 99.4 F (37.4 C) 98.7 F (37.1 C)  TempSrc: Oral Oral Oral Oral  Resp: 18 18 18 18   Height:      Weight:      SpO2: 94% 96% 95% 95%    Wt Readings from Last 3 Encounters:  05/12/12 70.5 kg (155 lb 6.8 oz)  05/12/12 70.5 kg (155 lb 6.8 oz)     Intake/Output Summary (Last 24 hours) at 05/14/12 1043 Last data filed at 05/13/12 1300  Gross per 24 hour  Intake    240 ml  Output      0 ml  Net    240 ml    Exam Awake Alert, Oriented X 3, No new F.N deficits, Normal affect [place].AT,PERRAL Supple Neck,No JVD, No cervical lymphadenopathy appriciated.  Symmetrical Chest wall movement, Good air movement bilaterally, CTAB RRR,No Gallops,Rubs or new Murmurs, No Parasternal Heave +ve B.Sounds, Abd Soft, Non tender, No organomegaly appriciated, No rebound - guarding or  rigidity. No Cyanosis, Clubbing or edema, No new Rash or bruise  , left arm  in splint   Data Review   Micro Results Recent Results (from the past 240 hour(s))  SURGICAL PCR SCREEN     Status: Normal   Collection Time   05/12/12  5:09 AM      Component Value Range Status Comment   MRSA, PCR NEGATIVE  NEGATIVE Final    Staphylococcus aureus NEGATIVE  NEGATIVE Final     Radiology Reports Dg Wrist Complete Left  05/11/2012  *RADIOLOGY REPORT*  Clinical Data: Left wrist pain and deformity after fall.  LEFT WRIST - COMPLETE 3+ VIEW  Comparison: None.  Findings: Comminuted fractures of the distal left radial metaphysis with volar displacement, angulation, and overriding of fracture fragments.  Fracture lines appear to extend to the radial ulnar joint.  No definite extension to the radial carpal joint. No associated ulnar styloid process fracture.  Diffuse soft tissue swelling.  Underlying degenerative changes are apparent in the STT and first carpometacarpal joints.  No focal bone lesions appreciated.  IMPRESSION: Comminuted fractures of the distal left radius.   Original Report Authenticated By: Burman Nieves, M.D.    Dg Ankle Complete Left  05/12/2012  *RADIOLOGY REPORT*  Clinical Data: Follow-up ankle fracture.  LEFT ANKLE COMPLETE - 3+ VIEW  Comparison: 05/11/2012  Findings: There is been interval placement of a fixation screws through the medial and posterior malleoli in the distal tibia, as well as a fixation plate and screws across a distal fibular fracture.  Fracture fragments are in anatomic alignment, and the talus is centered within the ankle mortise.  IMPRESSION: Internal fixation of trimalleolar ankle fracture in anatomic alignment.   Original Report Authenticated By: Myles Rosenthal, M.D.    Dg Ankle Complete Left  05/11/2012  *RADIOLOGY REPORT*  Clinical Data: Left ankle pain and deformity after fall.  LEFT ANKLE COMPLETE - 3+ VIEW  Comparison: None.  Findings: There are comminuted  trimalleolar fractures of the left ankle with mostly transverse fracture of the medial malleolus, oblique fracture of the lateral malleolus, and coronal fracture of the posterior malleolus.  There is a lateral displacement and angulation of the distal fracture fragments  with lateral displacement of the tibiotalar joint.  There is a posterior and superior displacement the tibiotalar joint with superior displacement of the posterior malleolar fracture fragment.  Soft tissue swelling.  No discrete bone lesions are appreciated.  IMPRESSION: Comminuted and displaced trimalleolar fractures the left ankle.   Original Report Authenticated By: Burman Nieves, M.D.    Dg Chest Port 1 View  05/13/2012  *RADIOLOGY REPORT*  Clinical Data: Fever.  PORTABLE CHEST - 1 VIEW  Comparison: None.  Findings: Normal sized heart.  Clear lungs.  Mild scoliosis, most likely positional.  IMPRESSION: No acute abnormality.   Original Report Authenticated By: Beckie Salts, M.D.     CBC  Lab 05/14/12 0730 05/11/12 2142  WBC 8.7 11.2*  HGB 12.0 14.4  HCT 35.3* 41.4  PLT 186 225  MCV 92.7 92.2  MCH 31.5 32.1  MCHC 34.0 34.8  RDW 12.0 12.1  LYMPHSABS -- 1.3  MONOABS -- 0.7  EOSABS -- 0.0  BASOSABS -- 0.0  BANDABS -- --    Chemistries   Lab 05/12/12 1339 05/11/12 2142  NA -- 135  K -- 3.6  CL -- 101  CO2 -- 19  GLUCOSE -- 102*  BUN -- 6  CREATININE -- 0.43*  CALCIUM -- 9.0  MG -- --  AST 21 --  ALT 12 --  ALKPHOS 64 --  BILITOT 0.5 --   ------------------------------------------------------------------------------------------------------------------ estimated creatinine clearance is 67.1 ml/min (by C-G formula based on Cr of 0.43). ------------------------------------------------------------------------------------------------------------------ No results found for this basename: HGBA1C:2 in the last 72  hours ------------------------------------------------------------------------------------------------------------------ No results found for this basename: CHOL:2,HDL:2,LDLCALC:2,TRIG:2,CHOLHDL:2,LDLDIRECT:2 in the last 72 hours ------------------------------------------------------------------------------------------------------------------ No results found for this basename: TSH,T4TOTAL,FREET3,T3FREE,THYROIDAB in the last 72 hours ------------------------------------------------------------------------------------------------------------------ No results found for this basename: VITAMINB12:2,FOLATE:2,FERRITIN:2,TIBC:2,IRON:2,RETICCTPCT:2 in the last 72 hours  Coagulation profile No results found for this basename: INR:5,PROTIME:5 in the last 168 hours  No results found for this basename: DDIMER:2 in the last 72 hours  Cardiac Enzymes No results found for this basename: CK:3,CKMB:3,TROPONINI:3,MYOGLOBIN:3 in the last 168 hours ------------------------------------------------------------------------------------------------------------------ No components found with this basename: POCBNP:3

## 2012-05-14 NOTE — Discharge Summary (Signed)
Physician Discharge Summary  Patient ID: XIAO GRAUL MRN: [id number] DOB/AGE: 01/19/1951 61 y.o.  Admit date: 05/11/2012 Discharge date: 05/14/2012  Admission Diagnoses:  Alcohol dependence, left distal radius fracture, left ankle fracture  Discharge Diagnoses:  Active Problems:  Alcohol dependence s/p ORIF L distal radius and L ankle  Discharged Condition: stable  Hospital Course: Pt was admitted on 11/1 and went to surgery in the morning of 1/2 for ORIF of her left distal radius by Dr. Magnus Ivan and her left ankle by me.  She tolerated these procedures well and has done well post op.  No sign of DTs.  Pain is well controlled.  She has done well with PT and OT and they recommend HH PT / OT.  Consults: hospitalist  Significant Diagnostic Studies: none  Treatments: therapies: PT and OT  Discharge Exam: Blood pressure 127/65, pulse 97, temperature 98.7 F (37.1 C), temperature source Oral, resp. rate 18, height 5\' 2"  (1.575 m), weight 70.5 kg (155 lb 6.8 oz), SpO2 95.00%. L UE and L LE splinted.  NVI at both extremities.  Disposition: to Home with HH PT and OT.  Discharge Orders    Future Orders Please Complete By Expires   Diet - low sodium heart healthy      Call MD / Call 911      Comments:   If you experience chest pain or shortness of breath, CALL 911 and be transported to the hospital emergency room.  If you develope a fever above 101 F, pus (white drainage) or increased drainage or redness at the wound, or calf pain, call your surgeon's office.   Constipation Prevention      Comments:   Drink plenty of fluids.  Prune juice may be helpful.  You may use a stool softener, such as Colace (over the counter) 100 mg twice a day.  Use MiraLax (over the counter) for constipation as needed.   Increase activity slowly as tolerated      Non weight bearing      Comments:   No weight on left foot.  Weight on left forearm only.       Medication List     As of 05/14/2012  7:28 AM      TAKE these medications         DSS 100 MG Caps   Take 100 mg by mouth 2 (two) times daily.      enoxaparin 40 MG/0.4ML injection   Commonly known as: LOVENOX   Inject 0.4 mLs (40 mg total) into the skin daily.      loratadine 10 MG tablet   Commonly known as: CLARITIN   Take 10 mg by mouth daily as needed. For allergies      multivitamin with minerals Tabs   Take 1 tablet by mouth daily.      omeprazole 20 MG capsule   Commonly known as: PRILOSEC   Take 20 mg by mouth daily.      oxyCODONE 5 MG immediate release tablet   Commonly known as: Oxy IR/ROXICODONE   Take 1-2 tablets (5-10 mg total) by mouth every 3 (three) hours as needed.      senna 8.6 MG Tabs   Commonly known as: SENOKOT   Take 1 tablet (8.6 mg total) by mouth 2 (two) times daily.           Follow-up Information    Follow up with Toni Arthurs, MD. Schedule an appointment as soon as possible for a visit in  2 weeks.   Contact information:   [address], [address] [place] [address] [phone]       Follow up with Kathryne Hitch, MD. Schedule an appointment as soon as possible for a visit in 2 weeks.   Contact information:   [address] [place] [address] [phone]          Signed: Toni Arthurs 05/14/2012, 7:28 AM

## 2012-05-14 NOTE — Progress Notes (Signed)
UR COMPLETED  

## 2012-05-14 NOTE — Progress Notes (Signed)
Occupational Therapy Treatment Patient Details Name: Taylor Brown MRN: [id number] DOB: [birth year] Today's Date: 05/14/2012 Time: 9147-8295 OT Time Calculation (min): 14 min  OT Assessment / Plan / Recommendation Comments on Treatment Session Demonstrated tub bench transfer at supervision level. Re-educated pt on proper technique for UB/LB dressing for when her husband brings her clothes to change into.     Follow Up Recommendations  Home health OT;Supervision/Assistance - 24 hour    Barriers to Discharge       Equipment Recommendations  Rolling walker with 5" wheels;Wheelchair cushion (measurements);Wheelchair (measurements);3 in 1 bedside comode;Other (comment) (L platform attach for RW and elevation R legrest for WC)    Recommendations for Other Services    Frequency Min 2X/week   Plan Discharge plan remains appropriate    Precautions / Restrictions Precautions Precautions: Fall Restrictions Weight Bearing Restrictions: Yes LUE Weight Bearing: Non weight bearing LLE Weight Bearing: Non weight bearing   Pertinent Vitals/Pain     ADL  Eating/Feeding: Performed;Set up Where Assessed - Eating/Feeding: Chair Toilet Transfer: Simulated;Minimal assistance Toilet Transfer Method: Sit to Barista:  (chair ) Tub/Shower Transfer: Engineer, manufacturing Method: Science writer: Counsellor Used: Gait belt;Rolling walker Transfers/Ambulation Related to ADLs: completes transfers with min (A) and vc's for slow and controlled descent back into chair.  ADL Comments: Pt simulated toilet transfer at supervision level. Pushed pt in recliner to ortho gym and educated/demonstrated to her how to use tub bench to transfer into tub. Pt showed good return demonstration at supervision level. Pt waiting for husband to bring clothes to get dressed, re-educated on dressing LUE and LLE first through clothing, pt  verbalized understanding.      OT Diagnosis:    OT Problem List:   OT Treatment Interventions:     OT Goals Acute Rehab OT Goals OT Goal Formulation: With patient Time For Goal Achievement: 05/27/12 Potential to Achieve Goals: Good ADL Goals Pt Will Perform Upper Body Bathing: with set-up;Sitting, chair;Sitting, edge of bed Pt Will Perform Lower Body Bathing: with set-up;Sit to stand from chair;Sit to stand from bed Pt Will Perform Upper Body Dressing: with set-up;Sitting, chair;Sitting, bed Pt Will Perform Lower Body Dressing: with set-up;Sit to stand from chair;Sit to stand from bed Pt Will Transfer to Toilet: Stand pivot transfer;with DME;3-in-1;Maintaining weight bearing status;with supervision ADL Goal: Toilet Transfer - Progress: Progressing toward goals Pt Will Perform Toileting - Clothing Manipulation: with set-up;Standing ADL Goal: Toileting - Clothing Manipulation - Progress: Progressing toward goals Pt Will Perform Toileting - Hygiene: with set-up;Sit to stand from 3-in-1/toilet;Standing at 3-in-1/toilet Pt Will Perform Tub/Shower Transfer: Tub transfer;with supervision;Stand pivot transfer;with DME;Transfer tub bench;Maintaining weight bearing status ADL Goal: Tub/Shower Transfer - Progress: Met Miscellaneous OT Goals Miscellaneous OT Goal #1: Pt will independently perform shoulder, elbow, and digt AROM. OT Goal: Miscellaneous Goal #1 - Progress: Met  Visit Information  Last OT Received On: 05/14/12 Assistance Needed: +1    Subjective Data      Prior Functioning       Cognition  Overall Cognitive Status: Appears within functional limits for tasks assessed/performed Arousal/Alertness: Awake/alert Orientation Level: Appears intact for tasks assessed Behavior During Session: [facility] for tasks performed Cognition - Other Comments: Patient with decrease safety and impulsive, however can perfrom task just requiring lots of cues for NWB on her L wrist. Patient ambulated  herself back from bathroom without assistance. Educated to have A when up    Mobility   Bed Mobility Bed  Mobility: Not assessed Supine to Sit: 6: Modified independent (Device/Increase time) Transfers Transfers: Sit to Stand;Stand to Sit Sit to Stand: With upper extremity assist;4: Min assist;From bed;From chair/3-in-1 Stand to Sit: With upper extremity assist;4: Min assist;To chair/3-in-1;To bed Details for Transfer Assistance: Cues for safe technique and WB status. A for stablity and balance                 End of Session OT - End of Session Equipment Utilized During Treatment: Gait belt Activity Tolerance: Patient tolerated treatment well Patient left: in chair;with call bell/phone within reach Nurse Communication: Mobility status  GO     Cleora Fleet 05/14/2012, 1:22 PM

## 2012-05-16 LAB — URINE CULTURE: Colony Count: 5000

## 2016-12-17 ENCOUNTER — Emergency Department: Payer: Medicare HMO

## 2016-12-17 ENCOUNTER — Inpatient Hospital Stay
Admission: EM | Admit: 2016-12-17 | Discharge: 2016-12-19 | DRG: 392 | Payer: Medicare HMO | Attending: Internal Medicine | Admitting: Internal Medicine

## 2016-12-17 ENCOUNTER — Encounter: Payer: Self-pay | Admitting: Emergency Medicine

## 2016-12-17 DIAGNOSIS — I10 Essential (primary) hypertension: Secondary | ICD-10-CM | POA: Diagnosis present

## 2016-12-17 DIAGNOSIS — Z23 Encounter for immunization: Secondary | ICD-10-CM | POA: Diagnosis not present

## 2016-12-17 DIAGNOSIS — X509XXA Other and unspecified overexertion or strenuous movements or postures, initial encounter: Secondary | ICD-10-CM

## 2016-12-17 DIAGNOSIS — S42309A Unspecified fracture of shaft of humerus, unspecified arm, initial encounter for closed fracture: Secondary | ICD-10-CM | POA: Diagnosis not present

## 2016-12-17 DIAGNOSIS — K219 Gastro-esophageal reflux disease without esophagitis: Secondary | ICD-10-CM | POA: Diagnosis present

## 2016-12-17 DIAGNOSIS — K921 Melena: Secondary | ICD-10-CM

## 2016-12-17 DIAGNOSIS — K449 Diaphragmatic hernia without obstruction or gangrene: Secondary | ICD-10-CM | POA: Diagnosis present

## 2016-12-17 DIAGNOSIS — Z716 Tobacco abuse counseling: Secondary | ICD-10-CM | POA: Diagnosis not present

## 2016-12-17 DIAGNOSIS — M810 Age-related osteoporosis without current pathological fracture: Secondary | ICD-10-CM | POA: Diagnosis present

## 2016-12-17 DIAGNOSIS — E876 Hypokalemia: Secondary | ICD-10-CM | POA: Diagnosis present

## 2016-12-17 DIAGNOSIS — M84421A Pathological fracture, right humerus, initial encounter for fracture: Secondary | ICD-10-CM | POA: Diagnosis not present

## 2016-12-17 DIAGNOSIS — K2900 Acute gastritis without bleeding: Principal | ICD-10-CM | POA: Diagnosis present

## 2016-12-17 DIAGNOSIS — Z79899 Other long term (current) drug therapy: Secondary | ICD-10-CM | POA: Diagnosis not present

## 2016-12-17 DIAGNOSIS — R69 Illness, unspecified: Secondary | ICD-10-CM | POA: Diagnosis not present

## 2016-12-17 DIAGNOSIS — K2960 Other gastritis without bleeding: Secondary | ICD-10-CM | POA: Diagnosis not present

## 2016-12-17 DIAGNOSIS — F1010 Alcohol abuse, uncomplicated: Secondary | ICD-10-CM | POA: Diagnosis present

## 2016-12-17 DIAGNOSIS — M79601 Pain in right arm: Secondary | ICD-10-CM | POA: Diagnosis not present

## 2016-12-17 DIAGNOSIS — K922 Gastrointestinal hemorrhage, unspecified: Secondary | ICD-10-CM | POA: Diagnosis not present

## 2016-12-17 DIAGNOSIS — R0600 Dyspnea, unspecified: Secondary | ICD-10-CM

## 2016-12-17 DIAGNOSIS — S42401A Unspecified fracture of lower end of right humerus, initial encounter for closed fracture: Secondary | ICD-10-CM | POA: Diagnosis not present

## 2016-12-17 DIAGNOSIS — R0602 Shortness of breath: Secondary | ICD-10-CM | POA: Diagnosis not present

## 2016-12-17 DIAGNOSIS — R1310 Dysphagia, unspecified: Secondary | ICD-10-CM | POA: Diagnosis present

## 2016-12-17 DIAGNOSIS — F17210 Nicotine dependence, cigarettes, uncomplicated: Secondary | ICD-10-CM | POA: Diagnosis present

## 2016-12-17 DIAGNOSIS — S42411A Displaced simple supracondylar fracture without intercondylar fracture of right humerus, initial encounter for closed fracture: Secondary | ICD-10-CM | POA: Diagnosis not present

## 2016-12-17 DIAGNOSIS — S42331A Displaced oblique fracture of shaft of humerus, right arm, initial encounter for closed fracture: Secondary | ICD-10-CM | POA: Diagnosis not present

## 2016-12-17 DIAGNOSIS — K259 Gastric ulcer, unspecified as acute or chronic, without hemorrhage or perforation: Secondary | ICD-10-CM | POA: Diagnosis not present

## 2016-12-17 LAB — COMPREHENSIVE METABOLIC PANEL
ALBUMIN: 3.5 g/dL (ref 3.5–5.0)
ALT: 53 U/L (ref 14–54)
ANION GAP: 18 — AB (ref 5–15)
AST: 89 U/L — AB (ref 15–41)
Alkaline Phosphatase: 134 U/L — ABNORMAL HIGH (ref 38–126)
BILIRUBIN TOTAL: 1.1 mg/dL (ref 0.3–1.2)
BUN: 8 mg/dL (ref 6–20)
CO2: 26 mmol/L (ref 22–32)
Calcium: 8.5 mg/dL — ABNORMAL LOW (ref 8.9–10.3)
Chloride: 99 mmol/L — ABNORMAL LOW (ref 101–111)
Creatinine, Ser: 0.54 mg/dL (ref 0.44–1.00)
GFR calc Af Amer: >60 mL/min (ref >60)
GFR calc non Af Amer: >60 mL/min (ref >60)
GLUCOSE: 121 mg/dL — AB (ref 65–99)
Potassium: 2.6 mmol/L — CL (ref 3.5–5.1)
Sodium: 143 mmol/L (ref 135–145)
TOTAL PROTEIN: 7 g/dL (ref 6.5–8.1)

## 2016-12-17 LAB — CBC WITH DIFFERENTIAL/PLATELET
BASOS PCT: 1 %
Basophils Absolute: 0.1 10*3/uL (ref 0–0.1)
EOS ABS: 0 10*3/uL (ref 0–0.7)
Eosinophils Relative: 0 %
HCT: 38.8 % (ref 35.0–47.0)
Hemoglobin: 13.4 g/dL (ref 12.0–16.0)
Lymphocytes Relative: 18 %
Lymphs Abs: 1.3 10*3/uL (ref 1.0–3.6)
MCH: 35.9 pg — ABNORMAL HIGH (ref 26.0–34.0)
MCHC: 34.4 g/dL (ref 32.0–36.0)
MCV: 104.5 fL — ABNORMAL HIGH (ref 80.0–100.0)
MONO ABS: 0.6 10*3/uL (ref 0.2–0.9)
MONOS PCT: 8 %
Neutro Abs: 5.2 10*3/uL (ref 1.4–6.5)
Neutrophils Relative %: 73 %
Platelets: 215 10*3/uL (ref 150–440)
RBC: 3.72 MIL/uL — ABNORMAL LOW (ref 3.80–5.20)
RDW: 16.2 % — AB (ref 11.5–14.5)
WBC: 7.2 10*3/uL (ref 3.6–11.0)

## 2016-12-17 LAB — LIPID PANEL
CHOLESTEROL: 186 mg/dL (ref 0–200)
HDL: 94 mg/dL (ref >40)
LDL Cholesterol: 79 mg/dL (ref 0–99)
TRIGLYCERIDES: 64 mg/dL (ref <150)
Total CHOL/HDL Ratio: 2 RATIO
VLDL: 13 mg/dL (ref 0–40)

## 2016-12-17 LAB — TYPE AND SCREEN
ABO/RH(D): O POS
Antibody Screen: NEGATIVE

## 2016-12-17 LAB — HEMOGLOBIN
HEMOGLOBIN: 13.4 g/dL (ref 12.0–16.0)
Hemoglobin: 13.5 g/dL (ref 12.0–16.0)

## 2016-12-17 LAB — PROTIME-INR
INR: 1.03
Prothrombin Time: 13.5 seconds (ref 11.4–15.2)

## 2016-12-17 LAB — LIPASE, BLOOD: LIPASE: 21 U/L (ref 11–51)

## 2016-12-17 LAB — FIBRIN DERIVATIVES D-DIMER (ARMC ONLY): FIBRIN DERIVATIVES D-DIMER (ARMC): 1369.79 — AB (ref 0.00–499.00)

## 2016-12-17 LAB — MAGNESIUM: MAGNESIUM: 1.5 mg/dL — AB (ref 1.7–2.4)

## 2016-12-17 LAB — ETHANOL: Alcohol, Ethyl (B): 34 mg/dL — ABNORMAL HIGH (ref <5)

## 2016-12-17 LAB — APTT: APTT: 29 s (ref 24–36)

## 2016-12-17 MED ORDER — DEXTROSE 5 % IV BOLUS
1000.0000 mL | Freq: Once | INTRAVENOUS | Status: AC
  Administered 2016-12-17: 1000 mL via INTRAVENOUS
  Filled 2016-12-17: qty 1000

## 2016-12-17 MED ORDER — POTASSIUM CHLORIDE CRYS ER 20 MEQ PO TBCR
40.0000 meq | EXTENDED_RELEASE_TABLET | Freq: Once | ORAL | Status: AC
  Administered 2016-12-17: 40 meq via ORAL
  Filled 2016-12-17: qty 2

## 2016-12-17 MED ORDER — MORPHINE SULFATE (PF) 2 MG/ML IV SOLN
2.0000 mg | INTRAVENOUS | Status: DC | PRN
  Administered 2016-12-17 – 2016-12-19 (×5): 2 mg via INTRAVENOUS
  Filled 2016-12-17 (×5): qty 1

## 2016-12-17 MED ORDER — PNEUMOCOCCAL VAC POLYVALENT 25 MCG/0.5ML IJ INJ
0.5000 mL | INJECTION | INTRAMUSCULAR | Status: AC
  Administered 2016-12-18: 0.5 mL via INTRAMUSCULAR
  Filled 2016-12-17: qty 0.5

## 2016-12-17 MED ORDER — METOPROLOL TARTRATE 25 MG PO TABS
25.0000 mg | ORAL_TABLET | Freq: Two times a day (BID) | ORAL | Status: DC
  Administered 2016-12-17 – 2016-12-19 (×4): 25 mg via ORAL
  Filled 2016-12-17 (×3): qty 1

## 2016-12-17 MED ORDER — SODIUM CHLORIDE 0.9 % IV BOLUS (SEPSIS)
1000.0000 mL | Freq: Once | INTRAVENOUS | Status: AC
  Administered 2016-12-17: 1000 mL via INTRAVENOUS

## 2016-12-17 MED ORDER — FOLIC ACID 1 MG PO TABS
1.0000 mg | ORAL_TABLET | Freq: Every day | ORAL | Status: DC
  Administered 2016-12-17 – 2016-12-19 (×3): 1 mg via ORAL
  Filled 2016-12-17 (×3): qty 1

## 2016-12-17 MED ORDER — IOPAMIDOL (ISOVUE-370) INJECTION 76%
75.0000 mL | Freq: Once | INTRAVENOUS | Status: AC | PRN
  Administered 2016-12-17: 75 mL via INTRAVENOUS

## 2016-12-17 MED ORDER — VITAMIN B-1 100 MG PO TABS
100.0000 mg | ORAL_TABLET | Freq: Every day | ORAL | Status: DC
  Administered 2016-12-18 – 2016-12-19 (×2): 100 mg via ORAL
  Filled 2016-12-17 (×2): qty 1

## 2016-12-17 MED ORDER — ORAL CARE MOUTH RINSE
15.0000 mL | Freq: Two times a day (BID) | OROMUCOSAL | Status: DC
  Administered 2016-12-18 – 2016-12-19 (×3): 15 mL via OROMUCOSAL

## 2016-12-17 MED ORDER — LORAZEPAM 1 MG PO TABS
2.0000 mg | ORAL_TABLET | ORAL | Status: DC | PRN
Start: 2016-12-17 — End: 2016-12-19

## 2016-12-17 MED ORDER — FENTANYL CITRATE (PF) 100 MCG/2ML IJ SOLN
50.0000 ug | INTRAMUSCULAR | Status: DC | PRN
  Administered 2016-12-17: 50 ug via INTRAVENOUS
  Filled 2016-12-17: qty 2

## 2016-12-17 MED ORDER — LORAZEPAM 2 MG/ML IJ SOLN
2.0000 mg | Freq: Once | INTRAMUSCULAR | Status: AC
  Administered 2016-12-17: 2 mg via INTRAVENOUS

## 2016-12-17 MED ORDER — LORAZEPAM 2 MG/ML IJ SOLN
2.0000 mg | INTRAMUSCULAR | Status: DC | PRN
  Administered 2016-12-17: 2 mg via INTRAVENOUS
  Filled 2016-12-17: qty 1

## 2016-12-17 MED ORDER — LORAZEPAM 2 MG/ML IJ SOLN
INTRAMUSCULAR | Status: AC
  Filled 2016-12-17: qty 1

## 2016-12-17 MED ORDER — ADULT MULTIVITAMIN W/MINERALS CH
1.0000 | ORAL_TABLET | Freq: Every day | ORAL | Status: DC
  Administered 2016-12-17 – 2016-12-19 (×3): 1 via ORAL
  Filled 2016-12-17 (×3): qty 1

## 2016-12-17 MED ORDER — AMLODIPINE BESYLATE 5 MG PO TABS
5.0000 mg | ORAL_TABLET | Freq: Every day | ORAL | Status: DC
  Administered 2016-12-17 – 2016-12-19 (×3): 5 mg via ORAL
  Filled 2016-12-17 (×4): qty 1

## 2016-12-17 MED ORDER — PANTOPRAZOLE SODIUM 40 MG IV SOLR
40.0000 mg | Freq: Two times a day (BID) | INTRAVENOUS | Status: DC
  Administered 2016-12-17 – 2016-12-19 (×3): 40 mg via INTRAVENOUS
  Filled 2016-12-17 (×3): qty 40

## 2016-12-17 MED ORDER — POTASSIUM CHLORIDE IN NACL 40-0.9 MEQ/L-% IV SOLN
INTRAVENOUS | Status: AC
  Administered 2016-12-17: 75 mL/h via INTRAVENOUS
  Filled 2016-12-17 (×2): qty 1000

## 2016-12-17 MED ORDER — DOCUSATE SODIUM 100 MG PO CAPS: 100.0000 mg | ORAL_CAPSULE | Freq: Two times a day (BID) | ORAL | Status: DC | PRN

## 2016-12-17 MED ORDER — ONDANSETRON HCL 4 MG/2ML IJ SOLN: 4.0000 mg | Freq: Four times a day (QID) | INTRAMUSCULAR | Status: DC | PRN

## 2016-12-17 MED ORDER — PANTOPRAZOLE SODIUM 40 MG IV SOLR
40.0000 mg | Freq: Once | INTRAVENOUS | Status: AC
  Administered 2016-12-17: 40 mg via INTRAVENOUS
  Filled 2016-12-17: qty 40

## 2016-12-17 MED ORDER — THIAMINE HCL 100 MG/ML IJ SOLN
100.0000 mg | Freq: Every day | INTRAMUSCULAR | Status: DC
  Administered 2016-12-17: 100 mg via INTRAVENOUS
  Filled 2016-12-17: qty 2

## 2016-12-17 NOTE — ED Provider Notes (Signed)
[facility] Emergency Department Provider Note  ____________________________________________  Time seen: Approximately 1:13 PM  I have reviewed the triage vital signs and the nursing notes.   HISTORY  Chief Complaint Arm Injury    HPI BRYTTANI BLEW is a 66 y.o. female comes to the ED with right arm pain that started suddenly while she was lifting a 6 pound bag of pet food into her car at the store. She felt a pop and sudden pain in her right arm that was worse with any movement or lifting of the arm. No other trauma. Pain is nonradiating. Denies weakness or paresthesias in the distal arm and hand. No alleviating factors, moderate intensity 5/10.  Patient also notes that she's been short of breath for the past year. Denies any chest pain. Dyspnea is worse with exertion and sometimes she has to stop and rest to catch her breath. She has not seen a doctor in at least 15 years she says. She has no known medical problems and takes no medicines.     Past Medical History:  Diagnosis Date  . Arthritis    osteo  . GERD (gastroesophageal reflux disease)      Patient Active Problem List   Diagnosis Date Noted  . Alcohol dependence ([place]) 05/13/2012     Past Surgical History:  Procedure Laterality Date  . ANKLE FRACTURE SURGERY  05/2012  . APPENDECTOMY    . ORIF ANKLE FRACTURE  05/12/2012   Procedure: OPEN REDUCTION INTERNAL FIXATION (ORIF) ANKLE FRACTURE;  Surgeon: Wylene Simmer, MD;  Location: [place];  Service: Orthopedics;  Laterality: Left;  . ORIF WRIST FRACTURE  05/12/2012   Procedure: OPEN REDUCTION INTERNAL FIXATION (ORIF) WRIST FRACTURE;  Surgeon: Mcarthur Rossetti, MD;  Location: [place];  Service: Orthopedics;  Laterality: Left;  . TUBAL LIGATION    . WRIST FRACTURE SURGERY  05/2012     Prior to Admission medications   Medication Sig Start Date End Date Taking? Authorizing Provider  esomeprazole (NEXIUM) 20 MG capsule Take 20 mg by mouth as  needed.   Yes [provider]  loratadine (CLARITIN) 10 MG tablet Take 10 mg by mouth daily as needed. For allergies   Yes [provider]  Multiple Vitamin (MULTIVITAMIN WITH MINERALS) TABS Take 1 tablet by mouth daily. 05/14/12  Yes Wylene Simmer, MD  naproxen sodium (ANAPROX) 220 MG tablet Take 220 mg by mouth 2 (two) times daily with a meal.   Yes [provider]  docusate sodium 100 MG CAPS Take 100 mg by mouth 2 (two) times daily. Patient not taking: Reported on 12/17/2016 05/14/12   Wylene Simmer, MD  enoxaparin (LOVENOX) 40 MG/0.4ML injection Inject 0.4 mLs (40 mg total) into the skin daily. Patient not taking: Reported on 12/17/2016 05/14/12   Wylene Simmer, MD  oxyCODONE (OXY IR/ROXICODONE) 5 MG immediate release tablet Take 1-2 tablets (5-10 mg total) by mouth every 3 (three) hours as needed. Patient not taking: Reported on 12/17/2016 05/14/12   Wylene Simmer, MD  senna (SENOKOT) 8.6 MG TABS Take 1 tablet (8.6 mg total) by mouth 2 (two) times daily. Patient not taking: Reported on 12/17/2016 05/14/12   Wylene Simmer, MD     Allergies Patient has no known allergies.   No family history on file.  Social History Social History  Substance Use Topics  . Smoking status: Current Every Day Smoker    Packs/day: 1.00    Years: 30.00    Types: Cigarettes  . Smokeless tobacco:  Never Used     Comment: smoking cessation material refused   . Alcohol use Yes     Comment: 6 beers per day    Review of Systems  Constitutional:   No fever or chills.  ENT:   No sore throat. No rhinorrhea. Cardiovascular:   No chest pain or syncope. Respiratory:   Positive shortness of breath no cough. Gastrointestinal:   Negative for abdominal pain, vomiting and diarrhea.  Musculoskeletal:   Positive right arm pain as above. Chronic bilateral lower extremity swelling which is unchanged All other systems reviewed and are negative except as documented above in ROS and  HPI.  ____________________________________________   PHYSICAL EXAM:  VITAL SIGNS: ED Triage Vitals  Enc Vitals Group     BP 12/17/16 1130 (!) 161/109     Pulse Rate 12/17/16 1130 (!) 113     Resp 12/17/16 1131 20     Temp 12/17/16 1131 99.2 F (37.3 C)     Temp Source 12/17/16 1131 Oral     SpO2 12/17/16 1130 91 %     Weight --      Height --      Head Circumference --      Peak Flow --      Pain Score 12/17/16 1126 5     Pain Loc --      Pain Edu? --      Excl. in [place]? --     Vital signs reviewed, nursing assessments reviewed.   Constitutional:   Alert and oriented. Not in distress. Eyes:   No scleral icterus.  EOMI. No nystagmus. No conjunctival pallor. PERRL. ENT   Head:   Normocephalic and atraumatic.   Nose:   No congestion/rhinnorhea.    Mouth/Throat:   Dry mucous membranes, no pharyngeal erythema. No peritonsillar mass.    Neck:   No meningismus. Full ROM Hematological/Lymphatic/Immunilogical:   No cervical lymphadenopathy. Cardiovascular:   Tachycardia heart rate 120. Symmetric bilateral radial and DP pulses.  No murmurs.  Respiratory:   Normal work of breathing. Tachypnea respiratory rate about 26.. Breath sounds are clear and equal bilaterally. No wheezes/rales/rhonchi. Gastrointestinal:   Soft and nontender. Non distended. There is no CVA tenderness.  No rebound, rigidity, or guarding.Melanotic stool, strongly Hemoccult-positive. Genitourinary:   deferred Musculoskeletal:  Right arm with swelling and deformity over the distal humerus. Very tender in this area. Intact distal movement with flexors and extensors and pronation and supination.. Neurologic:   Normal speech and language.  Motor grossly intact. Sensation intact No gross focal neurologic deficits are appreciated.  Skin:    Skin is warm, dry and intact. No rash noted.  No petechiae, purpura, or bullae. No lacerations associated with the upper arm  deformity  ____________________________________________    LABS (pertinent positives/negatives) (all labs ordered are listed, but only abnormal results are displayed) Labs Reviewed  COMPREHENSIVE METABOLIC PANEL - Abnormal; Notable for the following:       Result Value   Potassium 2.6 (*)    Chloride 99 (*)    Glucose, Bld 121 (*)    Calcium 8.5 (*)    AST 89 (*)    Alkaline Phosphatase 134 (*)    Anion gap 18 (*)    All other components within normal limits  ETHANOL - Abnormal; Notable for the following:    Alcohol, Ethyl (B) 34 (*)    All other components within normal limits  CBC WITH DIFFERENTIAL/PLATELET - Abnormal; Notable for the following:    RBC 3.72 (*)  MCV 104.5 (*)    MCH 35.9 (*)    RDW 16.2 (*)    All other components within normal limits  FIBRIN DERIVATIVES D-DIMER ([facility] ONLY) - Abnormal; Notable for the following:    Fibrin derivatives D-dimer [facility]) 1,369.79 (*)    All other components within normal limits  LIPASE, BLOOD  PROTIME-INR  APTT  TYPE AND SCREEN   ____________________________________________   EKG  Interpreted by me Sinus tachycardia rate 105, normal axis and intervals. Normal QRS ST segments and T waves.  ____________________________________________    RADIOLOGY  Ct Angio Chest Pe W Or Wo Contrast  Result Date: 12/17/2016 CLINICAL DATA:  Shortness of Breath EXAM: CT ANGIOGRAPHY CHEST WITH CONTRAST TECHNIQUE: Multidetector CT imaging of the chest was performed using the standard protocol during bolus administration of intravenous contrast. Multiplanar CT image reconstructions and MIPs were obtained to evaluate the vascular anatomy. CONTRAST:  75 mL Isovue 370 nonionic COMPARISON:  Chest radiograph December 17, 2016 FINDINGS: Cardiovascular: There is no demonstrable pulmonary embolus. There is no thoracic aortic aneurysm or dissection. Main pulmonary outflow tract measures 3.1 cm in length. There is mild calcification at the origin of the left  subclavian artery. Other visualized great vessels appear unremarkable. Pericardium is not appreciably thickened. There is left ventricular hypertrophy. Mediastinum/Nodes: Visualized thyroid appears unremarkable. There is no appreciable thoracic adenopathy. Lungs/Pleura: There is mild bibasilar atelectasis. There is no parenchymal lung edema or consolidation. No pleural effusion or pleural thickening evident. Upper Abdomen: There is diffuse hepatic steatosis. There is an enhancing focus in the anterior segment of the right lobe of the liver measuring 1.7 x 1.7 cm. A similar appearing lesion is noted near the dome of the liver in the medial segment left lobe measuring 1.5 x 1.3 cm. No other focal liver lesions are apparent ; note that liver is incompletely visualized. Visualized upper abdominal structures otherwise appear unremarkable. Musculoskeletal: There is degenerative change in the thoracic spine. There are no blastic or lytic bone lesions. There are several healed rib fractures on the left. Review of the MIP images confirms the above findings. IMPRESSION: No demonstrable pulmonary embolus. Prominence of the main pulmonary outflow tract suggests a degree of pulmonary arterial hypertension. No adenopathy. No lung edema or consolidation.  Bibasilar atelectasis noted. Enhancing lesions in the liver near the dome noted. Etiology uncertain. This finding warrants nonemergent pre and serial post-contrast CT or MR of the liver to further evaluate. Old healed rib fractures on the left. Left ventricular hypertrophy. Electronically Signed   By: Lowella Grip III M.D.   On: 12/17/2016 13:51   Dg Chest Portable 1 View  Result Date: 12/17/2016 CLINICAL DATA:  Shortness of breath and tachycardia EXAM: PORTABLE CHEST 1 VIEW COMPARISON:  05/13/2012 FINDINGS: Cardiomegaly noted. There is no evidence of focal airspace disease, pulmonary edema, suspicious pulmonary nodule/mass, pleural effusion, or pneumothorax. No acute  bony abnormalities are identified. IMPRESSION: Cardiomegaly without evidence of acute cardiopulmonary disease. Electronically Signed   By: Margarette Canada M.D.   On: 12/17/2016 12:11   Dg Humerus Right  Result Date: 12/17/2016 CLINICAL DATA:  Acute onset pain while lifting heavy object EXAM: RIGHT HUMERUS - 2+ VIEW COMPARISON:  None. FINDINGS: Frontal and lateral views were obtained. There is an obliquely oriented fracture of the distal humerus with medial angulation and anterior displacement of the distal fracture fragment with respect proximal fragment. No other fractures. No dislocations. No abnormal periosteal reaction or appreciable arthropathy. IMPRESSION: Obliquely oriented fracture distal humerus with displacement angulation  of fracture fragments. No dislocation. No abnormal periosteal reaction. No appreciable arthropathic change. Electronically Signed   By: Lowella Grip III M.D.   On: 12/17/2016 11:43    ____________________________________________   PROCEDURES Procedures  ____________________________________________   INITIAL IMPRESSION / ASSESSMENT AND PLAN / ED COURSE  Pertinent labs & imaging results that were available during my care of the patient were reviewed by me and considered in my medical decision making (see chart for details).    Clinical Course as of Dec 17 1452  Sat Dec 17, 2016  1204 Patient presents with right arm pain and swelling, distal humerus fracture, closed. Surprisingly she is also short of breath and tachycardic. She reports she has not seen a doctor in maybe 15 years and takes no medicines and has no known medical history. It's likely she has some underlying chronic illness, which will begin the workup with labs and chest x-ray. Give IV fluids for the tachycardia.  [PS]  1239 Potassium: (!!) 2.6 [PS]  1300 Will get CTA chest Fibrin derivatives D-dimer [facility]): (!) 1,369.79 [PS]  1339 CTA no obvious pathology. Will f/u radiology reoprt.   [PS]  2706  Chemistry findings c/w mild SKA/AKA from chronic alcoholism.  Will give further IVF. Pt eating lunch bag.  [PS]    Clinical Course User Index [PS] Carrie Mew, MD     ----------------------------------------- 2:53 PM on 12/17/2016 -----------------------------------------  Patient has recurrent melanotic stool in the ED. Strongly Hemoccult-positive. With persistent tachycardia and metabolic derangement, patient will need to be admitted for further evaluation and stabilization. Discussed with the hospitalist. Shoulder immobilizer to the right arm for initial fracture management. I will discuss with orthopedics for any immediate recommendations.  ____________________________________________   FINAL CLINICAL IMPRESSION(S) / ED DIAGNOSES  Final diagnoses:  Dyspnea  Acute upper GI bleed  Closed displaced oblique fracture of shaft of right humerus, initial encounter      New Prescriptions   No medications on file     Portions of this note were generated with dragon dictation software. Dictation errors may occur despite best attempts at proofreading.    Carrie Mew, MD 12/17/16 [phone]

## 2016-12-17 NOTE — ED Triage Notes (Signed)
Pt to ED via [facility]. Pt was putting a 6 pound  Bag of cat food in her car and heard a pop, EMS reports pt had obvious deformity to the right upper arm. Pt c/o 5/10 pain, pain is increased with movement. Arm currently splinted by EMS. Pt able to move fingers, pulses present.

## 2016-12-17 NOTE — H&P (Addendum)
[facility] NAME: Taylor Brown    MR#:  [id number]  DATE OF BIRTH:  August 10, 1950  DATE OF ADMISSION:  12/17/2016  PRIMARY CARE PHYSICIAN: Patient, [name]   REQUESTING/REFERRING PHYSICIAN: stafford  CHIEF COMPLAINT:   Chief Complaint  Patient presents with  . Arm Injury    HISTORY OF PRESENT ILLNESS: Taylor Brown  is a 66 y.o. female with a known history of Arthritis and gastroesophageal reflux disease- does not go to a doctor and does not take any prescription medicines at home but takes 2 tablets of Aleve every day for her pain. Today she was at [employer] where she was picking up with food pouch, which was roughly 6-8 pounds and while trying to move it quickly she heard a pop in her right arm and started hurting so came to emergency room. In ER she is noted to have a fracture on her humerus on the right side. She is also noted to have 8-9 episodes of blood in the stool in emergency room today. She denies noticing any blood at home previously. Denies any associated vomiting with abdominal epigastric pain currently. Overall patient is in very poor hygienic conditions, did not took shower for many days and her skin have lots and lots of dirt and crustings with very foul-smelling.  PAST MEDICAL HISTORY:   Past Medical History:  Diagnosis Date  . Arthritis    osteo  . GERD (gastroesophageal reflux disease)     PAST SURGICAL HISTORY: Past Surgical History:  Procedure Laterality Date  . ANKLE FRACTURE SURGERY  05/2012  . APPENDECTOMY    . ORIF ANKLE FRACTURE  05/12/2012   Procedure: OPEN REDUCTION INTERNAL FIXATION (ORIF) ANKLE FRACTURE;  Surgeon: Wylene Simmer, MD;  Location: [place];  Service: Orthopedics;  Laterality: Left;  . ORIF WRIST FRACTURE  05/12/2012   Procedure: OPEN REDUCTION INTERNAL FIXATION (ORIF) WRIST FRACTURE;  Surgeon: Mcarthur Rossetti, MD;  Location: [place];  Service: Orthopedics;  Laterality: Left;  . TUBAL  LIGATION    . WRIST FRACTURE SURGERY  05/2012    SOCIAL HISTORY:  Social History  Substance Use Topics  . Smoking status: Current Every Day Smoker    Packs/day: 1.00    Years: 30.00    Types: Cigarettes  . Smokeless tobacco: Never Used     Comment: smoking cessation material refused   . Alcohol use Yes     Comment: 6 beers per day    FAMILY HISTORY:  Family History  Problem Relation Age of Onset  . Hypertension Mother     DRUG ALLERGIES: No Known Allergies  REVIEW OF SYSTEMS:   CONSTITUTIONAL: No fever, fatigue or weakness.  EYES: No blurred or double vision.  EARS, NOSE, AND THROAT: No tinnitus or ear pain.  RESPIRATORY: No cough, shortness of breath, wheezing or hemoptysis.  CARDIOVASCULAR: No chest pain, orthopnea, edema.  GASTROINTESTINAL: No nausea, vomiting, diarrhea or abdominal pain. No blood in the stool today. GENITOURINARY: No dysuria, hematuria.  ENDOCRINE: No polyuria, nocturia,  HEMATOLOGY: No anemia, easy bruising or bleeding SKIN: No rash or lesion. MUSCULOSKELETAL: Right arm pain.   NEUROLOGIC: No tingling, numbness, weakness.  PSYCHIATRY: No anxiety or depression.   MEDICATIONS AT HOME:  Prior to Admission medications   Medication Sig Start Date End Date Taking? Authorizing Provider  esomeprazole (NEXIUM) 20 MG capsule Take 20 mg by mouth as needed.   Yes [provider]  loratadine (CLARITIN) 10 MG  tablet Take 10 mg by mouth daily as needed. For allergies   Yes [provider]  Multiple Vitamin (MULTIVITAMIN WITH MINERALS) TABS Take 1 tablet by mouth daily. 05/14/12  Yes Wylene Simmer, MD  naproxen sodium (ANAPROX) 220 MG tablet Take 220 mg by mouth 2 (two) times daily with a meal.   Yes [provider]  docusate sodium 100 MG CAPS Take 100 mg by mouth 2 (two) times daily. Patient not taking: Reported on 12/17/2016 05/14/12   Wylene Simmer, MD  enoxaparin (LOVENOX) 40 MG/0.4ML injection Inject 0.4 mLs (40 mg total) into the  skin daily. Patient not taking: Reported on 12/17/2016 05/14/12   Wylene Simmer, MD  oxyCODONE (OXY IR/ROXICODONE) 5 MG immediate release tablet Take 1-2 tablets (5-10 mg total) by mouth every 3 (three) hours as needed. Patient not taking: Reported on 12/17/2016 05/14/12   Wylene Simmer, MD  senna (SENOKOT) 8.6 MG TABS Take 1 tablet (8.6 mg total) by mouth 2 (two) times daily. Patient not taking: Reported on 12/17/2016 05/14/12   Wylene Simmer, MD      PHYSICAL EXAMINATION:   VITAL SIGNS: Blood pressure (!) 179/112, pulse (!) 110, temperature 99.2 F (37.3 C), temperature source Oral, resp. rate (!) 29, SpO2 92 %.  GENERAL:  66 y.o.-year-old patient lying in the bed with no acute distress. Very unhygienic and dirty looking. EYES: Pupils equal, round, reactive to light and accommodation. No scleral icterus. Extraocular muscles intact.  HEENT: Head atraumatic, normocephalic. Oropharynx and nasopharynx clear.  NECK:  Supple, no jugular venous distention. No thyroid enlargement, no tenderness.  LUNGS: Normal breath sounds bilaterally, no wheezing, rales,rhonchi or crepitation. No use of accessory muscles of respiration.  CARDIOVASCULAR: S1, S2 normal. No murmurs, rubs, or gallops.  ABDOMEN: Soft, nontender, nondistended. Bowel sounds present. No organomegaly or mass.  EXTREMITIES: No pedal edema, cyanosis, or clubbing. Have some redness on both legs and chronic skin changes and crustings present in both legs. Her right arm is swollen and tender. NEUROLOGIC: Cranial nerves II through XII are intact. Muscle strength 5/5 in all extremities. Sensation intact. Gait not checked.  PSYCHIATRIC: The patient is alert and oriented x 3.  SKIN: No obvious rash, lesion, or ulcer.   LABORATORY PANEL:   CBC  Recent Labs Lab 12/17/16 1153  WBC 7.2  HGB 13.4  HCT 38.8  PLT 215  MCV 104.5*  MCH 35.9*  MCHC 34.4  RDW 16.2*  LYMPHSABS 1.3  MONOABS 0.6  EOSABS 0.0  BASOSABS 0.1    ------------------------------------------------------------------------------------------------------------------  Chemistries   Recent Labs Lab 12/17/16 1153  NA 143  K 2.6*  CL 99*  CO2 26  GLUCOSE 121*  BUN 8  CREATININE 0.54  CALCIUM 8.5*  AST 89*  ALT 53  ALKPHOS 134*  BILITOT 1.1   ------------------------------------------------------------------------------------------------------------------ CrCl cannot be calculated (Unknown ideal weight.). ------------------------------------------------------------------------------------------------------------------ No results for input(s): TSH, T4TOTAL, T3FREE, THYROIDAB in the last 72 hours.  Invalid input(s): FREET3   Coagulation profile  Recent Labs Lab 12/17/16 1153  INR 1.03   ------------------------------------------------------------------------------------------------------------------- No results for input(s): DDIMER in the last 72 hours. -------------------------------------------------------------------------------------------------------------------  Cardiac Enzymes No results for input(s): CKMB, TROPONINI, MYOGLOBIN in the last 168 hours.  Invalid input(s): CK ------------------------------------------------------------------------------------------------------------------ Invalid input(s): POCBNP  ---------------------------------------------------------------------------------------------------------------  Urinalysis    Component Value Date/Time   COLORURINE YELLOW 05/13/2012 1440   APPEARANCEUR CLOUDY (A) 05/13/2012 1440   LABSPEC 1.016 05/13/2012 1440   PHURINE 6.5 05/13/2012 1440   GLUCOSEU 100 (A) 05/13/2012 1440   HGBUR  TRACE (A) 05/13/2012 1440   BILIRUBINUR NEGATIVE 05/13/2012 1440   KETONESUR 15 (A) 05/13/2012 1440   PROTEINUR NEGATIVE 05/13/2012 1440   UROBILINOGEN 0.2 05/13/2012 1440   NITRITE NEGATIVE 05/13/2012 1440   LEUKOCYTESUR MODERATE (A) 05/13/2012 1440      RADIOLOGY: Ct Angio Chest Pe W Or Wo Contrast  Result Date: 12/17/2016 CLINICAL DATA:  Shortness of Breath EXAM: CT ANGIOGRAPHY CHEST WITH CONTRAST TECHNIQUE: Multidetector CT imaging of the chest was performed using the standard protocol during bolus administration of intravenous contrast. Multiplanar CT image reconstructions and MIPs were obtained to evaluate the vascular anatomy. CONTRAST:  75 mL Isovue 370 nonionic COMPARISON:  Chest radiograph December 17, 2016 FINDINGS: Cardiovascular: There is no demonstrable pulmonary embolus. There is no thoracic aortic aneurysm or dissection. Main pulmonary outflow tract measures 3.1 cm in length. There is mild calcification at the origin of the left subclavian artery. Other visualized great vessels appear unremarkable. Pericardium is not appreciably thickened. There is left ventricular hypertrophy. Mediastinum/Nodes: Visualized thyroid appears unremarkable. There is no appreciable thoracic adenopathy. Lungs/Pleura: There is mild bibasilar atelectasis. There is no parenchymal lung edema or consolidation. No pleural effusion or pleural thickening evident. Upper Abdomen: There is diffuse hepatic steatosis. There is an enhancing focus in the anterior segment of the right lobe of the liver measuring 1.7 x 1.7 cm. A similar appearing lesion is noted near the dome of the liver in the medial segment left lobe measuring 1.5 x 1.3 cm. No other focal liver lesions are apparent ; note that liver is incompletely visualized. Visualized upper abdominal structures otherwise appear unremarkable. Musculoskeletal: There is degenerative change in the thoracic spine. There are no blastic or lytic bone lesions. There are several healed rib fractures on the left. Review of the MIP images confirms the above findings. IMPRESSION: No demonstrable pulmonary embolus. Prominence of the main pulmonary outflow tract suggests a degree of pulmonary arterial hypertension. No adenopathy. No lung edema  or consolidation.  Bibasilar atelectasis noted. Enhancing lesions in the liver near the dome noted. Etiology uncertain. This finding warrants nonemergent pre and serial post-contrast CT or MR of the liver to further evaluate. Old healed rib fractures on the left. Left ventricular hypertrophy. Electronically Signed   By: Lowella Grip III M.D.   On: 12/17/2016 13:51   Dg Chest Portable 1 View  Result Date: 12/17/2016 CLINICAL DATA:  Shortness of breath and tachycardia EXAM: PORTABLE CHEST 1 VIEW COMPARISON:  05/13/2012 FINDINGS: Cardiomegaly noted. There is no evidence of focal airspace disease, pulmonary edema, suspicious pulmonary nodule/mass, pleural effusion, or pneumothorax. No acute bony abnormalities are identified. IMPRESSION: Cardiomegaly without evidence of acute cardiopulmonary disease. Electronically Signed   By: Margarette Canada M.D.   On: 12/17/2016 12:11   Dg Humerus Right  Result Date: 12/17/2016 CLINICAL DATA:  Acute onset pain while lifting heavy object EXAM: RIGHT HUMERUS - 2+ VIEW COMPARISON:  None. FINDINGS: Frontal and lateral views were obtained. There is an obliquely oriented fracture of the distal humerus with medial angulation and anterior displacement of the distal fracture fragment with respect proximal fragment. No other fractures. No dislocations. No abnormal periosteal reaction or appreciable arthropathy. IMPRESSION: Obliquely oriented fracture distal humerus with displacement angulation of fracture fragments. No dislocation. No abnormal periosteal reaction. No appreciable arthropathic change. Electronically Signed   By: Lowella Grip III M.D.   On: 12/17/2016 11:43    EKG: Orders placed or performed during the hospital encounter of 12/17/16  . EKG 12-Lead  . EKG 12-Lead  IMPRESSION AND PLAN:  * Acute GI blood She uses Aleve every day at home. We will give IV Protonix twice a day and call GI consult for further management meanwhile will keep her on nothing by  mouth and hemoglobin checks.  * Humerus fracture on the right  Orthopedic consult, morphine IV for pain.  * Hypertension  Metoprolol and amlodipine for now.  * Smoking  Counseled to quit smoking for 4 minutes and offered nicotine patch.  * No health maintenance   I will check hemoglobin A1c and lipid panel for now.   We may need social worker and case manager consults to look into her living conditions with  * Chronic ETOh abuse   Keep on CIWA protocol.  All the records are reviewed and case discussed with ED provider. Management plans discussed with the patient, family and they are in agreement.  CODE STATUS: full Code Status History    Date Active Date Inactive Code Status Order ID Comments User Context   05/12/2012  4:28 AM 05/14/2012  5:50 PM Full Code [id number]  Peggye Pitt, RN Inpatient       TOTAL TIME TAKING CARE OF THIS PATIENT: 50 minutes.    Vaughan Basta M.D on 12/17/2016   Between 7am to 6pm - Pager - [phone]  After 6pm go to [email or web address] - password [email or web address] [facility]  Office  [phone]  CC: Primary care physician; Patient, [name]   Note: This dictation was prepared with Dragon dictation along with smaller phrase technology. Any transcriptional errors that result from this process are unintentional.

## 2016-12-18 ENCOUNTER — Inpatient Hospital Stay: Payer: Medicare HMO | Admitting: Anesthesiology

## 2016-12-18 ENCOUNTER — Encounter
Admission: EM | Disposition: A | Discharge status: Other Acute Inpatient Hospital | Payer: Self-pay | Source: Home / Self Care | Attending: Internal Medicine

## 2016-12-18 HISTORY — PX: ESOPHAGOGASTRODUODENOSCOPY: SHX5428

## 2016-12-18 LAB — URINALYSIS, COMPLETE (UACMP) WITH MICROSCOPIC
BILIRUBIN URINE: NEGATIVE
Glucose, UA: NEGATIVE mg/dL
KETONES UR: 20 mg/dL — AB
LEUKOCYTES UA: NEGATIVE
Nitrite: NEGATIVE
PH: 8 (ref 5.0–8.0)
PROTEIN: NEGATIVE mg/dL
Specific Gravity, Urine: 1.008 (ref 1.005–1.030)

## 2016-12-18 LAB — MAGNESIUM: MAGNESIUM: 1.5 mg/dL — AB (ref 1.7–2.4)

## 2016-12-18 LAB — POTASSIUM: POTASSIUM: 3.4 mmol/L — AB (ref 3.5–5.1)

## 2016-12-18 LAB — HEMOGLOBIN: Hemoglobin: 14 g/dL (ref 12.0–16.0)

## 2016-12-18 LAB — BASIC METABOLIC PANEL
Anion gap: 11 (ref 5–15)
CO2: 33 mmol/L — AB (ref 22–32)
Calcium: 8 mg/dL — ABNORMAL LOW (ref 8.9–10.3)
Chloride: 94 mmol/L — ABNORMAL LOW (ref 101–111)
Creatinine, Ser: <0.3 mg/dL — ABNORMAL LOW (ref 0.44–1.00)
GLUCOSE: 103 mg/dL — AB (ref 65–99)
POTASSIUM: 2.8 mmol/L — AB (ref 3.5–5.1)
Sodium: 138 mmol/L (ref 135–145)

## 2016-12-18 LAB — CBC
HEMATOCRIT: 38.8 % (ref 35.0–47.0)
Hemoglobin: 13.3 g/dL (ref 12.0–16.0)
MCH: 36 pg — AB (ref 26.0–34.0)
MCHC: 34.2 g/dL (ref 32.0–36.0)
MCV: 105.3 fL — ABNORMAL HIGH (ref 80.0–100.0)
Platelets: 181 10*3/uL (ref 150–440)
RBC: 3.69 MIL/uL — ABNORMAL LOW (ref 3.80–5.20)
RDW: 16 % — AB (ref 11.5–14.5)
WBC: 6.8 10*3/uL (ref 3.6–11.0)

## 2016-12-18 LAB — HEMOGLOBIN A1C
Hgb A1c MFr Bld: 5.7 % — ABNORMAL HIGH (ref 4.8–5.6)
MEAN PLASMA GLUCOSE: 117 mg/dL

## 2016-12-18 SURGERY — EGD (ESOPHAGOGASTRODUODENOSCOPY)
Anesthesia: General

## 2016-12-18 SURGERY — ESOPHAGOGASTRODUODENOSCOPY (EGD) WITH PROPOFOL
Anesthesia: Monitor Anesthesia Care

## 2016-12-18 MED ORDER — SODIUM CHLORIDE 0.9 % IV SOLN
INTRAVENOUS | Status: DC
  Administered 2016-12-18: 13:00:00 via INTRAVENOUS

## 2016-12-18 MED ORDER — NICOTINE 21 MG/24HR TD PT24
21.0000 mg | MEDICATED_PATCH | Freq: Every day | TRANSDERMAL | Status: DC
  Administered 2016-12-18 – 2016-12-19 (×2): 21 mg via TRANSDERMAL
  Filled 2016-12-18 (×2): qty 1

## 2016-12-18 MED ORDER — NYSTATIN 100000 UNIT/GM EX OINT
TOPICAL_OINTMENT | Freq: Three times a day (TID) | CUTANEOUS | Status: DC
  Administered 2016-12-18 – 2016-12-19 (×4): via TOPICAL
  Filled 2016-12-18: qty 15

## 2016-12-18 MED ORDER — POTASSIUM CHLORIDE IN NACL 40-0.9 MEQ/L-% IV SOLN
INTRAVENOUS | Status: DC
  Administered 2016-12-18 – 2016-12-19 (×3): 75 mL/h via INTRAVENOUS
  Filled 2016-12-18 (×4): qty 1000

## 2016-12-18 MED ORDER — LIDOCAINE HCL (CARDIAC) 20 MG/ML IV SOLN
INTRAVENOUS | Status: DC | PRN
  Administered 2016-12-18: 100 mg via INTRATRACHEAL

## 2016-12-18 MED ORDER — PROPOFOL 10 MG/ML IV BOLUS
INTRAVENOUS | Status: DC | PRN
  Administered 2016-12-18: 60 mg via INTRAVENOUS

## 2016-12-18 MED ORDER — OXYCODONE-ACETAMINOPHEN 5-325 MG PO TABS: 1.0000 | ORAL_TABLET | Freq: Four times a day (QID) | ORAL | Status: DC | PRN

## 2016-12-18 MED ORDER — PROPOFOL 500 MG/50ML IV EMUL
INTRAVENOUS | Status: DC | PRN
  Administered 2016-12-18: 140 ug/kg/min via INTRAVENOUS

## 2016-12-18 MED ORDER — PROPOFOL 500 MG/50ML IV EMUL
INTRAVENOUS | Status: AC
  Filled 2016-12-18: qty 50

## 2016-12-18 MED ORDER — POTASSIUM CHLORIDE 10 MEQ/100ML IV SOLN
10.0000 meq | INTRAVENOUS | Status: AC
  Administered 2016-12-18 (×4): 10 meq via INTRAVENOUS
  Filled 2016-12-18 (×4): qty 100

## 2016-12-18 NOTE — Consult Note (Signed)
Referring Provider: Dr. Tressia Miners Primary Care Physician:  Patient, [name] Primary Gastroenterologist:  Althia Forts  Reason for Consultation:  Melena  HPI: Taylor Brown is a 66 y.o. female admitted for a dyspnea, GI bleed, and a right arm fracture. She reports seeing red blood per rectum on occasion but not lately and denies seeing black stools. ER doctor saw melena on rectal exam. She has a history of GERD that is well-controlled with Nexium OTC that she takes as needed. Occasional dysphagia. Denies N/V/hematemesis. Denies abdominal pain. On Naproxen BID. Drinks a 6 pack of beer per day. Hgb 13.4. No known history of ulcers. Denies previous colonoscopy.  Past Medical History:  Diagnosis Date  . Arthritis    osteo  . GERD (gastroesophageal reflux disease)     Past Surgical History:  Procedure Laterality Date  . ANKLE FRACTURE SURGERY  05/2012  . APPENDECTOMY    . ORIF ANKLE FRACTURE  05/12/2012   Procedure: OPEN REDUCTION INTERNAL FIXATION (ORIF) ANKLE FRACTURE;  Surgeon: Wylene Simmer, MD;  Location: [place];  Service: Orthopedics;  Laterality: Left;  . ORIF WRIST FRACTURE  05/12/2012   Procedure: OPEN REDUCTION INTERNAL FIXATION (ORIF) WRIST FRACTURE;  Surgeon: Mcarthur Rossetti, MD;  Location: [place];  Service: Orthopedics;  Laterality: Left;  . TUBAL LIGATION    . WRIST FRACTURE SURGERY  05/2012    Prior to Admission medications   Medication Sig Start Date End Date Taking? Authorizing Provider  esomeprazole (NEXIUM) 20 MG capsule Take 20 mg by mouth as needed.   Yes [provider]  loratadine (CLARITIN) 10 MG tablet Take 10 mg by mouth daily as needed. For allergies   Yes [provider]  Multiple Vitamin (MULTIVITAMIN WITH MINERALS) TABS Take 1 tablet by mouth daily. 05/14/12  Yes Wylene Simmer, MD  naproxen sodium (ANAPROX) 220 MG tablet Take 220 mg by mouth 2 (two) times daily with a meal.   Yes [provider]  docusate sodium 100 MG CAPS Take 100  mg by mouth 2 (two) times daily. Patient not taking: Reported on 12/17/2016 05/14/12   Wylene Simmer, MD  enoxaparin (LOVENOX) 40 MG/0.4ML injection Inject 0.4 mLs (40 mg total) into the skin daily. Patient not taking: Reported on 12/17/2016 05/14/12   Wylene Simmer, MD  oxyCODONE (OXY IR/ROXICODONE) 5 MG immediate release tablet Take 1-2 tablets (5-10 mg total) by mouth every 3 (three) hours as needed. Patient not taking: Reported on 12/17/2016 05/14/12   Wylene Simmer, MD  senna (SENOKOT) 8.6 MG TABS Take 1 tablet (8.6 mg total) by mouth 2 (two) times daily. Patient not taking: Reported on 12/17/2016 05/14/12   Wylene Simmer, MD    Scheduled Meds: . amLODipine  5 mg Oral Daily  . folic acid  1 mg Oral Daily  . mouth rinse  15 mL Mouth Rinse BID  . metoprolol tartrate  25 mg Oral BID  . multivitamin with minerals  1 tablet Oral Daily  . pantoprazole (PROTONIX) IV  40 mg Intravenous Q12H  . pneumococcal 23 valent vaccine  0.5 mL Intramuscular Tomorrow-1000  . thiamine  100 mg Oral Daily   Or  . thiamine  100 mg Intravenous Daily   Continuous Infusions: . 0.9 % NaCl with KCl 40 mEq / L 75 mL/hr (12/18/16 0756)  . potassium chloride 10 mEq (12/18/16 0753)   PRN Meds:.docusate sodium, fentaNYL (SUBLIMAZE) injection, LORazepam **OR** LORazepam, morphine injection, ondansetron (ZOFRAN) IV  Allergies as of 12/17/2016  . (No Known Allergies)  Family History  Problem Relation Age of Onset  . Hypertension Mother     Social History   Social History  . Marital status: Married    Spouse name: N/A  . Number of children: N/A  . Years of education: N/A   Occupational History  . Not on file.   Social History Main Topics  . Smoking status: Current Every Day Smoker    Packs/day: 1.00    Years: 30.00    Types: Cigarettes  . Smokeless tobacco: Never Used     Comment: smoking cessation material refused   . Alcohol use Yes     Comment: 6 beers per day  . Drug use: No  . Sexual activity: Not on  file   Other Topics Concern  . Not on file   Social History Narrative  . No narrative on file    Review of Systems: All negative except as stated above in HPI.  Physical Exam: Vital signs: Vitals:   12/17/16 1750 12/18/16 0455  BP: (!) 165/86 (!) 145/73  Pulse:  (!) 114  Resp:  20  Temp:  98.3 F (36.8 C)   Last BM Date: 12/17/16 General:   Lethargic, morbidly obese, no acute distress, disheveled  HEENT: anicteric sclera, oropharynx clear Neck: nontender Lungs:  Expiratory wheezing bilaterally  Heart:  Regular rate and rhythm; no murmurs, clicks, rubs,  or gallops. Abdomen: soft, nontender, nondistended, +BS  Rectal:  Deferred Ext: no edema  GI:  Lab Results:  Recent Labs  12/17/16 1153 12/17/16 1730 12/17/16 2321 12/18/16 0727  WBC 7.2  --   --  6.8  HGB 13.4 13.5 13.4 13.3  HCT 38.8  --   --  38.8  PLT 215  --   --  181   BMET  Recent Labs  12/17/16 1153 12/18/16 0727  NA 143 138  K 2.6* 2.8*  CL 99* 94*  CO2 26 33*  GLUCOSE 121* 103*  BUN 8 <5*  CREATININE 0.54 <0.30*  CALCIUM 8.5* 8.0*   LFT  Recent Labs  12/17/16 1153  PROT 7.0  ALBUMIN 3.5  AST 89*  ALT 53  ALKPHOS 134*  BILITOT 1.1   PT/INR  Recent Labs  12/17/16 1153  LABPROT 13.5  INR 1.03     Studies/Results: Ct Angio Chest Pe W Or Wo Contrast  Result Date: 12/17/2016 CLINICAL DATA:  Shortness of Breath EXAM: CT ANGIOGRAPHY CHEST WITH CONTRAST TECHNIQUE: Multidetector CT imaging of the chest was performed using the standard protocol during bolus administration of intravenous contrast. Multiplanar CT image reconstructions and MIPs were obtained to evaluate the vascular anatomy. CONTRAST:  75 mL Isovue 370 nonionic COMPARISON:  Chest radiograph December 17, 2016 FINDINGS: Cardiovascular: There is no demonstrable pulmonary embolus. There is no thoracic aortic aneurysm or dissection. Main pulmonary outflow tract measures 3.1 cm in length. There is mild calcification at the origin of  the left subclavian artery. Other visualized great vessels appear unremarkable. Pericardium is not appreciably thickened. There is left ventricular hypertrophy. Mediastinum/Nodes: Visualized thyroid appears unremarkable. There is no appreciable thoracic adenopathy. Lungs/Pleura: There is mild bibasilar atelectasis. There is no parenchymal lung edema or consolidation. No pleural effusion or pleural thickening evident. Upper Abdomen: There is diffuse hepatic steatosis. There is an enhancing focus in the anterior segment of the right lobe of the liver measuring 1.7 x 1.7 cm. A similar appearing lesion is noted near the dome of the liver in the medial segment left lobe measuring 1.5 x 1.3 cm.  No other focal liver lesions are apparent ; note that liver is incompletely visualized. Visualized upper abdominal structures otherwise appear unremarkable. Musculoskeletal: There is degenerative change in the thoracic spine. There are no blastic or lytic bone lesions. There are several healed rib fractures on the left. Review of the MIP images confirms the above findings. IMPRESSION: No demonstrable pulmonary embolus. Prominence of the main pulmonary outflow tract suggests a degree of pulmonary arterial hypertension. No adenopathy. No lung edema or consolidation.  Bibasilar atelectasis noted. Enhancing lesions in the liver near the dome noted. Etiology uncertain. This finding warrants nonemergent pre and serial post-contrast CT or MR of the liver to further evaluate. Old healed rib fractures on the left. Left ventricular hypertrophy. Electronically Signed   By: Lowella Grip III M.D.   On: 12/17/2016 13:51   Dg Chest Portable 1 View  Result Date: 12/17/2016 CLINICAL DATA:  Shortness of breath and tachycardia EXAM: PORTABLE CHEST 1 VIEW COMPARISON:  05/13/2012 FINDINGS: Cardiomegaly noted. There is no evidence of focal airspace disease, pulmonary edema, suspicious pulmonary nodule/mass, pleural effusion, or pneumothorax. No  acute bony abnormalities are identified. IMPRESSION: Cardiomegaly without evidence of acute cardiopulmonary disease. Electronically Signed   By: Margarette Canada M.D.   On: 12/17/2016 12:11   Dg Humerus Right  Result Date: 12/17/2016 CLINICAL DATA:  Acute onset pain while lifting heavy object EXAM: RIGHT HUMERUS - 2+ VIEW COMPARISON:  None. FINDINGS: Frontal and lateral views were obtained. There is an obliquely oriented fracture of the distal humerus with medial angulation and anterior displacement of the distal fracture fragment with respect proximal fragment. No other fractures. No dislocations. No abnormal periosteal reaction or appreciable arthropathy. IMPRESSION: Obliquely oriented fracture distal humerus with displacement angulation of fracture fragments. No dislocation. No abnormal periosteal reaction. No appreciable arthropathic change. Electronically Signed   By: Lowella Grip III M.D.   On: 12/17/2016 11:43    Impression/Plan: 66 yo with melena in the setting of alcohol abuse and NSAID abuse concerning for peptic ulcer bleed vs alcoholic gastritis. Esophagitis also possible but less likely as a source of her bleeding. Replace potassium per primary team. NPO for EGD today with Propofol by anesthesia. Continue IV Protonix. Supportive care.    LOS: 1 day   [place] C.  12/18/2016, 8:32 AM

## 2016-12-18 NOTE — H&P (View-Only) (Signed)
Referring Provider: Dr. Tressia Miners Primary Care Physician:  Patient, [name] Primary Gastroenterologist:  Althia Forts  Reason for Consultation:  Melena  HPI: Taylor Brown is a 66 y.o. female admitted for a dyspnea, GI bleed, and a right arm fracture. She reports seeing red blood per rectum on occasion but not lately and denies seeing black stools. ER doctor saw melena on rectal exam. She has a history of GERD that is well-controlled with Nexium OTC that she takes as needed. Occasional dysphagia. Denies N/V/hematemesis. Denies abdominal pain. On Naproxen BID. Drinks a 6 pack of beer per day. Hgb 13.4. No known history of ulcers. Denies previous colonoscopy.  Past Medical History:  Diagnosis Date  . Arthritis    osteo  . GERD (gastroesophageal reflux disease)     Past Surgical History:  Procedure Laterality Date  . ANKLE FRACTURE SURGERY  05/2012  . APPENDECTOMY    . ORIF ANKLE FRACTURE  05/12/2012   Procedure: OPEN REDUCTION INTERNAL FIXATION (ORIF) ANKLE FRACTURE;  Surgeon: Wylene Simmer, MD;  Location: [facility];  Service: Orthopedics;  Laterality: Left;  . ORIF WRIST FRACTURE  05/12/2012   Procedure: OPEN REDUCTION INTERNAL FIXATION (ORIF) WRIST FRACTURE;  Surgeon: Mcarthur Rossetti, MD;  Location: [facility];  Service: Orthopedics;  Laterality: Left;  . TUBAL LIGATION    . WRIST FRACTURE SURGERY  05/2012    Prior to Admission medications   Medication Sig Start Date End Date Taking? Authorizing Provider  esomeprazole (NEXIUM) 20 MG capsule Take 20 mg by mouth as needed.   Yes [provider]  loratadine (CLARITIN) 10 MG tablet Take 10 mg by mouth daily as needed. For allergies   Yes [provider]  Multiple Vitamin (MULTIVITAMIN WITH MINERALS) TABS Take 1 tablet by mouth daily. 05/14/12  Yes Wylene Simmer, MD  naproxen sodium (ANAPROX) 220 MG tablet Take 220 mg by mouth 2 (two) times daily with a meal.   Yes [provider]  docusate sodium 100 MG CAPS Take 100  mg by mouth 2 (two) times daily. Patient not taking: Reported on 12/17/2016 05/14/12   Wylene Simmer, MD  enoxaparin (LOVENOX) 40 MG/0.4ML injection Inject 0.4 mLs (40 mg total) into the skin daily. Patient not taking: Reported on 12/17/2016 05/14/12   Wylene Simmer, MD  oxyCODONE (OXY IR/ROXICODONE) 5 MG immediate release tablet Take 1-2 tablets (5-10 mg total) by mouth every 3 (three) hours as needed. Patient not taking: Reported on 12/17/2016 05/14/12   Wylene Simmer, MD  senna (SENOKOT) 8.6 MG TABS Take 1 tablet (8.6 mg total) by mouth 2 (two) times daily. Patient not taking: Reported on 12/17/2016 05/14/12   Wylene Simmer, MD    Scheduled Meds: . amLODipine  5 mg Oral Daily  . folic acid  1 mg Oral Daily  . mouth rinse  15 mL Mouth Rinse BID  . metoprolol tartrate  25 mg Oral BID  . multivitamin with minerals  1 tablet Oral Daily  . pantoprazole (PROTONIX) IV  40 mg Intravenous Q12H  . pneumococcal 23 valent vaccine  0.5 mL Intramuscular Tomorrow-1000  . thiamine  100 mg Oral Daily   Or  . thiamine  100 mg Intravenous Daily   Continuous Infusions: . 0.9 % NaCl with KCl 40 mEq / L 75 mL/hr (12/18/16 0756)  . potassium chloride 10 mEq (12/18/16 0753)   PRN Meds:.docusate sodium, fentaNYL (SUBLIMAZE) injection, LORazepam **OR** LORazepam, morphine injection, ondansetron (ZOFRAN) IV  Allergies as of 12/17/2016  . (No Known Allergies)  Family History  Problem Relation Age of Onset  . Hypertension Mother     Social History   Social History  . Marital status: Married    Spouse name: N/A  . Number of children: N/A  . Years of education: N/A   Occupational History  . Not on file.   Social History Main Topics  . Smoking status: Current Every Day Smoker    Packs/day: 1.00    Years: 30.00    Types: Cigarettes  . Smokeless tobacco: Never Used     Comment: smoking cessation material refused   . Alcohol use Yes     Comment: 6 beers per day  . Drug use: No  . Sexual activity: Not on  file   Other Topics Concern  . Not on file   Social History Narrative  . No narrative on file    Review of Systems: All negative except as stated above in HPI.  Physical Exam: Vital signs: Vitals:   12/17/16 1750 12/18/16 0455  BP: (!) 165/86 (!) 145/73  Pulse:  (!) 114  Resp:  20  Temp:  98.3 F (36.8 C)   Last BM Date: 12/17/16 General:   Lethargic, morbidly obese, no acute distress, disheveled  HEENT: anicteric sclera, oropharynx clear Neck: nontender Lungs:  Expiratory wheezing bilaterally  Heart:  Regular rate and rhythm; no murmurs, clicks, rubs,  or gallops. Abdomen: soft, nontender, nondistended, +BS  Rectal:  Deferred Ext: no edema  GI:  Lab Results:  Recent Labs  12/17/16 1153 12/17/16 1730 12/17/16 2321 12/18/16 0727  WBC 7.2  --   --  6.8  HGB 13.4 13.5 13.4 13.3  HCT 38.8  --   --  38.8  PLT 215  --   --  181   BMET  Recent Labs  12/17/16 1153 12/18/16 0727  NA 143 138  K 2.6* 2.8*  CL 99* 94*  CO2 26 33*  GLUCOSE 121* 103*  BUN 8 <5*  CREATININE 0.54 <0.30*  CALCIUM 8.5* 8.0*   LFT  Recent Labs  12/17/16 1153  PROT 7.0  ALBUMIN 3.5  AST 89*  ALT 53  ALKPHOS 134*  BILITOT 1.1   PT/INR  Recent Labs  12/17/16 1153  LABPROT 13.5  INR 1.03     Studies/Results: Ct Angio Chest Pe W Or Wo Contrast  Result Date: 12/17/2016 CLINICAL DATA:  Shortness of Breath EXAM: CT ANGIOGRAPHY CHEST WITH CONTRAST TECHNIQUE: Multidetector CT imaging of the chest was performed using the standard protocol during bolus administration of intravenous contrast. Multiplanar CT image reconstructions and MIPs were obtained to evaluate the vascular anatomy. CONTRAST:  75 mL Isovue 370 nonionic COMPARISON:  Chest radiograph December 17, 2016 FINDINGS: Cardiovascular: There is no demonstrable pulmonary embolus. There is no thoracic aortic aneurysm or dissection. Main pulmonary outflow tract measures 3.1 cm in length. There is mild calcification at the origin of  the left subclavian artery. Other visualized great vessels appear unremarkable. Pericardium is not appreciably thickened. There is left ventricular hypertrophy. Mediastinum/Nodes: Visualized thyroid appears unremarkable. There is no appreciable thoracic adenopathy. Lungs/Pleura: There is mild bibasilar atelectasis. There is no parenchymal lung edema or consolidation. No pleural effusion or pleural thickening evident. Upper Abdomen: There is diffuse hepatic steatosis. There is an enhancing focus in the anterior segment of the right lobe of the liver measuring 1.7 x 1.7 cm. A similar appearing lesion is noted near the dome of the liver in the medial segment left lobe measuring 1.5 x 1.3 cm.  No other focal liver lesions are apparent ; note that liver is incompletely visualized. Visualized upper abdominal structures otherwise appear unremarkable. Musculoskeletal: There is degenerative change in the thoracic spine. There are no blastic or lytic bone lesions. There are several healed rib fractures on the left. Review of the MIP images confirms the above findings. IMPRESSION: No demonstrable pulmonary embolus. Prominence of the main pulmonary outflow tract suggests a degree of pulmonary arterial hypertension. No adenopathy. No lung edema or consolidation.  Bibasilar atelectasis noted. Enhancing lesions in the liver near the dome noted. Etiology uncertain. This finding warrants nonemergent pre and serial post-contrast CT or MR of the liver to further evaluate. Old healed rib fractures on the left. Left ventricular hypertrophy. Electronically Signed   By: Lowella Grip III M.D.   On: 12/17/2016 13:51   Dg Chest Portable 1 View  Result Date: 12/17/2016 CLINICAL DATA:  Shortness of breath and tachycardia EXAM: PORTABLE CHEST 1 VIEW COMPARISON:  05/13/2012 FINDINGS: Cardiomegaly noted. There is no evidence of focal airspace disease, pulmonary edema, suspicious pulmonary nodule/mass, pleural effusion, or pneumothorax. No  acute bony abnormalities are identified. IMPRESSION: Cardiomegaly without evidence of acute cardiopulmonary disease. Electronically Signed   By: Margarette Canada M.D.   On: 12/17/2016 12:11   Dg Humerus Right  Result Date: 12/17/2016 CLINICAL DATA:  Acute onset pain while lifting heavy object EXAM: RIGHT HUMERUS - 2+ VIEW COMPARISON:  None. FINDINGS: Frontal and lateral views were obtained. There is an obliquely oriented fracture of the distal humerus with medial angulation and anterior displacement of the distal fracture fragment with respect proximal fragment. No other fractures. No dislocations. No abnormal periosteal reaction or appreciable arthropathy. IMPRESSION: Obliquely oriented fracture distal humerus with displacement angulation of fracture fragments. No dislocation. No abnormal periosteal reaction. No appreciable arthropathic change. Electronically Signed   By: Lowella Grip III M.D.   On: 12/17/2016 11:43    Impression/Plan: 66 yo with melena in the setting of alcohol abuse and NSAID abuse concerning for peptic ulcer bleed vs alcoholic gastritis. Esophagitis also possible but less likely as a source of her bleeding. Replace potassium per primary team. NPO for EGD today with Propofol by anesthesia. Continue IV Protonix. Supportive care.    LOS: 1 day   [place] C.  12/18/2016, 8:32 AM

## 2016-12-18 NOTE — Consult Note (Signed)
ORTHOPAEDIC CONSULTATION  REQUESTING PHYSICIAN: Gladstone Lighter, MD  Chief Complaint: Right distal humerus fracture  HPI: Taylor Brown is a 66 y.o. female who complains of  pain in the right elbow after an injury yesterday. Patient was at a pet store and lifted a heavy bag of food. She explains that the bag was heavy and began to slip out of her hands. She went to toss the bag before it fell out of her hands.  In the process of tossing, she felt a pop and severe pain in her right arm. Patient was diagnosed with a displaced, but closed right distal humerus fracture by x-ray in the ER.  She was admitted to medical service and is being evaluated for a GI bleed. Patient is scheduled for an endoscopy later today.  She has been taking Aleve on a daily basis and drinks an estimated 6 beers a day.  Past Medical History:  Diagnosis Date  . Arthritis    osteo  . GERD (gastroesophageal reflux disease)    Past Surgical History:  Procedure Laterality Date  . ANKLE FRACTURE SURGERY  05/2012  . APPENDECTOMY    . ORIF ANKLE FRACTURE  05/12/2012   Procedure: OPEN REDUCTION INTERNAL FIXATION (ORIF) ANKLE FRACTURE;  Surgeon: Wylene Simmer, MD;  Location: [facility];  Service: Orthopedics;  Laterality: Left;  . ORIF WRIST FRACTURE  05/12/2012   Procedure: OPEN REDUCTION INTERNAL FIXATION (ORIF) WRIST FRACTURE;  Surgeon: Mcarthur Rossetti, MD;  Location: [facility];  Service: Orthopedics;  Laterality: Left;  . TUBAL LIGATION    . WRIST FRACTURE SURGERY  05/2012   Social History   Social History  . Marital status: Married    Spouse name: N/A  . Number of children: N/A  . Years of education: N/A   Social History Main Topics  . Smoking status: Current Every Day Smoker    Packs/day: 1.00    Years: 30.00    Types: Cigarettes  . Smokeless tobacco: Never Used     Comment: smoking cessation material refused   . Alcohol use Yes     Comment: 6 beers per day  . Drug use: No  . Sexual activity: Not Asked    Other Topics Concern  . None   Social History Narrative  . None   Family History  Problem Relation Age of Onset  . Hypertension Mother    No Known Allergies Prior to Admission medications   Medication Sig Start Date End Date Taking? Authorizing Provider  esomeprazole (NEXIUM) 20 MG capsule Take 20 mg by mouth as needed.   Yes [provider]  loratadine (CLARITIN) 10 MG tablet Take 10 mg by mouth daily as needed. For allergies   Yes [provider]  Multiple Vitamin (MULTIVITAMIN WITH MINERALS) TABS Take 1 tablet by mouth daily. 05/14/12  Yes Wylene Simmer, MD  naproxen sodium (ANAPROX) 220 MG tablet Take 220 mg by mouth 2 (two) times daily with a meal.   Yes [provider]  docusate sodium 100 MG CAPS Take 100 mg by mouth 2 (two) times daily. Patient not taking: Reported on 12/17/2016 05/14/12   Wylene Simmer, MD  enoxaparin (LOVENOX) 40 MG/0.4ML injection Inject 0.4 mLs (40 mg total) into the skin daily. Patient not taking: Reported on 12/17/2016 05/14/12   Wylene Simmer, MD  oxyCODONE (OXY IR/ROXICODONE) 5 MG immediate release tablet Take 1-2 tablets (5-10 mg total) by mouth every 3 (three) hours as needed. Patient not taking: Reported on 12/17/2016 05/14/12   Doran Durand,  Jenny Reichmann, MD  senna (SENOKOT) 8.6 MG TABS Take 1 tablet (8.6 mg total) by mouth 2 (two) times daily. Patient not taking: Reported on 12/17/2016 05/14/12   Wylene Simmer, MD   Ct Angio Chest Pe W Or Wo Contrast  Result Date: 12/17/2016 CLINICAL DATA:  Shortness of Breath EXAM: CT ANGIOGRAPHY CHEST WITH CONTRAST TECHNIQUE: Multidetector CT imaging of the chest was performed using the standard protocol during bolus administration of intravenous contrast. Multiplanar CT image reconstructions and MIPs were obtained to evaluate the vascular anatomy. CONTRAST:  75 mL Isovue 370 nonionic COMPARISON:  Chest radiograph December 17, 2016 FINDINGS: Cardiovascular: There is no demonstrable pulmonary embolus. There is no thoracic  aortic aneurysm or dissection. Main pulmonary outflow tract measures 3.1 cm in length. There is mild calcification at the origin of the left subclavian artery. Other visualized great vessels appear unremarkable. Pericardium is not appreciably thickened. There is left ventricular hypertrophy. Mediastinum/Nodes: Visualized thyroid appears unremarkable. There is no appreciable thoracic adenopathy. Lungs/Pleura: There is mild bibasilar atelectasis. There is no parenchymal lung edema or consolidation. No pleural effusion or pleural thickening evident. Upper Abdomen: There is diffuse hepatic steatosis. There is an enhancing focus in the anterior segment of the right lobe of the liver measuring 1.7 x 1.7 cm. A similar appearing lesion is noted near the dome of the liver in the medial segment left lobe measuring 1.5 x 1.3 cm. No other focal liver lesions are apparent ; note that liver is incompletely visualized. Visualized upper abdominal structures otherwise appear unremarkable. Musculoskeletal: There is degenerative change in the thoracic spine. There are no blastic or lytic bone lesions. There are several healed rib fractures on the left. Review of the MIP images confirms the above findings. IMPRESSION: No demonstrable pulmonary embolus. Prominence of the main pulmonary outflow tract suggests a degree of pulmonary arterial hypertension. No adenopathy. No lung edema or consolidation.  Bibasilar atelectasis noted. Enhancing lesions in the liver near the dome noted. Etiology uncertain. This finding warrants nonemergent pre and serial post-contrast CT or MR of the liver to further evaluate. Old healed rib fractures on the left. Left ventricular hypertrophy. Electronically Signed   By: Lowella Grip III M.D.   On: 12/17/2016 13:51   Dg Chest Portable 1 View  Result Date: 12/17/2016 CLINICAL DATA:  Shortness of breath and tachycardia EXAM: PORTABLE CHEST 1 VIEW COMPARISON:  05/13/2012 FINDINGS: Cardiomegaly noted. There  is no evidence of focal airspace disease, pulmonary edema, suspicious pulmonary nodule/mass, pleural effusion, or pneumothorax. No acute bony abnormalities are identified. IMPRESSION: Cardiomegaly without evidence of acute cardiopulmonary disease. Electronically Signed   By: Margarette Canada M.D.   On: 12/17/2016 12:11   Dg Humerus Right  Result Date: 12/17/2016 CLINICAL DATA:  Acute onset pain while lifting heavy object EXAM: RIGHT HUMERUS - 2+ VIEW COMPARISON:  None. FINDINGS: Frontal and lateral views were obtained. There is an obliquely oriented fracture of the distal humerus with medial angulation and anterior displacement of the distal fracture fragment with respect proximal fragment. No other fractures. No dislocations. No abnormal periosteal reaction or appreciable arthropathy. IMPRESSION: Obliquely oriented fracture distal humerus with displacement angulation of fracture fragments. No dislocation. No abnormal periosteal reaction. No appreciable arthropathic change. Electronically Signed   By: Lowella Grip III M.D.   On: 12/17/2016 11:43    Positive ROS: All other systems have been reviewed and were otherwise negative with the exception of those mentioned in the HPI and as above.  Physical Exam: General: Alert, no acute distress  MUSCULOSKELETAL: Right upper extremity: Patient's skin is closed. There is swelling and ecchymosis around her right elbow. Her arm and forearm compartments are soft and compressible. There is no obvious deformity. Her right upper extremity is in a shoulder immobilizer. She can flex and extend all 5 digits of the right hand and flex and extend her wrist actively. She has a palpable radial pulse and intact sensation light touch. Her fingers are well-perfused.  Assessment: Closed, displaced fracture of the right distal humerus  Plan: Patient is currently stable and well mobilized. She is going to have an upper endoscopy today to evaluate for possible GI bleed. Patient  will likely require surgery for this fracture. The presence or absence of an upper GI bleed will certainly play a role in surgical planning. For now I recommend continued immobilization. Patient would likely benefit from occupational therapy for ADLs while immobilized. I will continue to follow. Surgery may be performed as an outpatient.      Thornton Park, MD    12/18/2016 11:44 AM

## 2016-12-18 NOTE — Progress Notes (Signed)
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

## 2016-12-18 NOTE — Anesthesia Preprocedure Evaluation (Signed)
Anesthesia Evaluation  Patient identified by MRN, date of birth, ID band Patient awake    Reviewed: Allergy & Precautions, H&P , NPO status , Patient's Chart, lab work & pertinent test results, reviewed documented beta blocker date and time   History of Anesthesia Complications Negative for: history of anesthetic complications  Airway Mallampati: IV  TM Distance: >3 FB Neck ROM: full    Dental  (+) Edentulous Upper, Missing, Dental Advidsory Given, Poor Dentition, Loose   Pulmonary shortness of breath and with exertion, neg sleep apnea, COPD, neg recent URI, Current Smoker,           Cardiovascular Exercise Tolerance: Good hypertension, (-) angina(-) CAD, (-) Past MI, (-) Cardiac Stents and (-) CABG (-) dysrhythmias (-) Valvular Problems/Murmurs     Neuro/Psych negative neurological ROS  negative psych ROS   GI/Hepatic GERD  ,(+)     substance abuse (6 beers per day)  alcohol use,   Endo/Other  negative endocrine ROS  Renal/GU negative Renal ROS  negative genitourinary   Musculoskeletal   Abdominal   Peds  Hematology negative hematology ROS (+)   Anesthesia Other Findings Past Medical History: No date: Arthritis     Comment: osteo No date: GERD (gastroesophageal reflux disease)   Reproductive/Obstetrics negative OB ROS                             Anesthesia Physical Anesthesia Plan  ASA: III  Anesthesia Plan: General   Post-op Pain Management:    Induction:   PONV Risk Score and Plan: 2 and Propofol  Airway Management Planned:   Additional Equipment:   Intra-op Plan:   Post-operative Plan:   Informed Consent: I have reviewed the patients History and Physical, chart, labs and discussed the procedure including the risks, benefits and alternatives for the proposed anesthesia with the patient or authorized representative who has indicated his/her understanding and  acceptance.   Dental Advisory Given  Plan Discussed with: Anesthesiologist, CRNA and Surgeon  Anesthesia Plan Comments:         Anesthesia Quick Evaluation

## 2016-12-18 NOTE — Clinical Social Work Note (Signed)
CSW received consult that patient cannot care for herself at home. CSW will assess when able.  Taylor Brown, MSW, Latanya Presser [phone]

## 2016-12-18 NOTE — Transfer of Care (Signed)
Immediate Anesthesia Transfer of Care Note  Patient: Taylor Brown  Procedure(s) Performed: Procedure(s): ESOPHAGOGASTRODUODENOSCOPY (EGD) (N/A)  Patient Location: PACU  Anesthesia Type:General  Level of Consciousness: sedated  Airway & Oxygen Therapy: Patient Spontanous Breathing and Patient connected to nasal cannula oxygen  Post-op Assessment: Report given to RN and Post -op Vital signs reviewed and stable  Post vital signs: Reviewed and stable  Last Vitals:  Vitals:   12/18/16 1131 12/18/16 1308  BP: (!) 154/83 (!) 189/78  Pulse: 97 (!) 109  Resp:    Temp:  37.2 C    Last Pain:  Vitals:   12/18/16 1308  TempSrc: Tympanic  PainSc: 2       Patients Stated Pain Goal: 1 (44/45/84 8350)  Complications: No apparent anesthesia complications

## 2016-12-18 NOTE — Plan of Care (Signed)
Problem: Skin Integrity: Goal: Risk for impaired skin integrity will decrease Outcome: Progressing RN applied nystatin to affected areas on pt and bilateral leg wounds were treated per wound nurse consult. Pt tolerate treatment well. Will continue to assess.

## 2016-12-18 NOTE — Anesthesia Post-op Follow-up Note (Cosign Needed)
Anesthesia QCDR form completed.        

## 2016-12-18 NOTE — Interval H&P Note (Signed)
History and Physical Interval Note:  12/18/2016 1:17 PM  Taylor Brown  has presented today for surgery, with the diagnosis of Upper GI bleed  The various methods of treatment have been discussed with the patient and family. After consideration of risks, benefits and other options for treatment, the patient has consented to  Procedure(s): ESOPHAGOGASTRODUODENOSCOPY (EGD) (N/A) as a surgical intervention .  The patient's history has been reviewed, patient examined, no change in status, stable for surgery.  I have reviewed the patient's chart and labs.  Questions were answered to the patient's satisfaction.     [place] C.

## 2016-12-18 NOTE — Anesthesia Postprocedure Evaluation (Signed)
Anesthesia Post Note  Patient: Taylor Brown  Procedure(s) Performed: Procedure(s) (LRB): ESOPHAGOGASTRODUODENOSCOPY (EGD) (N/A)  Patient location during evaluation: Endoscopy Anesthesia Type: General Level of consciousness: awake and alert Pain management: pain level controlled Vital Signs Assessment: post-procedure vital signs reviewed and stable Respiratory status: spontaneous breathing, nonlabored ventilation, respiratory function stable and patient connected to nasal cannula oxygen Cardiovascular status: blood pressure returned to baseline and stable Postop Assessment: no signs of nausea or vomiting Anesthetic complications: no     Last Vitals:  Vitals:   12/18/16 1423 12/18/16 1445  BP: (!) 186/84 (!) 156/92  Pulse: 98 (!) 107  Resp: (!) 22 (!) 21  Temp:  36.8 C    Last Pain:  Vitals:   12/18/16 1445  TempSrc: Oral  PainSc:                  Martha Clan

## 2016-12-18 NOTE — Progress Notes (Signed)
Prime doctor notified of potassium level being 2.6 last night with no recheck this am 12/18/2016. Endoscopy nurse notified RN that potassium has to be at least 3.0 to be able to have procedure done today. New orders placed by prime doctor, VSS, pt is alert X4.Will continue to monitor pt.   Sherilynn Dieu [employer]

## 2016-12-18 NOTE — OR Nursing (Addendum)
PT S/P UPPER ENDOSCOPY. DX GASTRITIS WITH ULCERATION. REPORT TO LAUREN PRIMARY RN. PT TOLERATING FLUIDS AND WILL BE ADVANCED TO SOFT DIET

## 2016-12-18 NOTE — Progress Notes (Signed)
[facility] NAME: Taylor Brown    MR#:  [id number]  DATE OF BIRTH:  [birth year]  SUBJECTIVE:  CHIEF COMPLAINT:   Chief Complaint  Patient presents with  . Arm Injury   - Admitted for right humerus fracture, also noted to have melena in the ER. -Currently on Protonix IV and going for endoscopy today. Complaints of right arm pain  REVIEW OF SYSTEMS:  Review of Systems  Constitutional: Positive for malaise/fatigue. Negative for chills and fever.  HENT: Negative for congestion, ear discharge, hearing loss and nosebleeds.   Eyes: Negative for blurred vision and double vision.  Respiratory: Negative for cough, shortness of breath and wheezing.   Cardiovascular: Negative for chest pain, palpitations and leg swelling.  Gastrointestinal: Positive for melena and nausea. Negative for abdominal pain, constipation, diarrhea and vomiting.  Genitourinary: Negative for dysuria and urgency.  Musculoskeletal: Positive for joint pain and myalgias.  Skin: Positive for itching.  Neurological: Negative for dizziness, sensory change, speech change, focal weakness, seizures and headaches.  Psychiatric/Behavioral: Negative for depression.    DRUG ALLERGIES:  No Known Allergies  VITALS:  Blood pressure (!) 154/83, pulse 97, temperature 98.3 F (36.8 C), temperature source Oral, resp. rate 20, height 5' (1.524 m), weight 88.5 kg (195 lb), SpO2 98 %.  PHYSICAL EXAMINATION:  Physical Exam  GENERAL:  66 y.o.-year-old patient lying in the bed with no acute distress. Disheveled appearing EYES: Pupils equal, round, reactive to light and accommodation. No scleral icterus. Extraocular muscles intact.  HEENT: Head atraumatic, normocephalic. Oropharynx and nasopharynx clear.  NECK:  Supple, no jugular venous distention. No thyroid enlargement, no tenderness.  LUNGS: Normal breath sounds bilaterally, scattered expiratory wheezing, no rales,rhonchi or  crepitation. No use of accessory muscles of respiration. Decreased bibasilar breath sounds. CARDIOVASCULAR: S1, S2 normal. No murmurs, rubs, or gallops.  ABDOMEN: Soft, nontender, nondistended. Bowel sounds present. No organomegaly or mass.  EXTREMITIES: No  cyanosis, or clubbing. 1+ pedal edema noted NEUROLOGIC: Cranial nerves II through XII are intact. Muscle strength 5/5 in all extremities except right upper extremity which is in a sling currently.. Sensation intact. Gait not checked. Global weakness present PSYCHIATRIC: The patient is alert and oriented x 3.  SKIN: Significant redness in the groin fold noted. Skin on lower extremities with chronic pigmentation changes.    LABORATORY PANEL:   CBC  Recent Labs Lab 12/18/16 0727  WBC 6.8  HGB 13.3  HCT 38.8  PLT 181   ------------------------------------------------------------------------------------------------------------------  Chemistries   Recent Labs Lab 12/17/16 1153 12/18/16 0727 12/18/16 1124  NA 143 138  --   K 2.6* 2.8* 3.4*  CL 99* 94*  --   CO2 26 33*  --   GLUCOSE 121* 103*  --   BUN 8 <5*  --   CREATININE 0.54 <0.30*  --   CALCIUM 8.5* 8.0*  --   MG 1.5*  --   --   AST 89*  --   --   ALT 53  --   --   ALKPHOS 134*  --   --   BILITOT 1.1  --   --    ------------------------------------------------------------------------------------------------------------------  Cardiac Enzymes No results for input(s): TROPONINI in the last 168 hours. ------------------------------------------------------------------------------------------------------------------  RADIOLOGY:  Ct Angio Chest Pe W Or Wo Contrast  Result Date: 12/17/2016 CLINICAL DATA:  Shortness of Breath EXAM: CT ANGIOGRAPHY CHEST WITH CONTRAST TECHNIQUE: Multidetector CT imaging of the chest was performed using the standard  protocol during bolus administration of intravenous contrast. Multiplanar CT image reconstructions and MIPs were obtained to  evaluate the vascular anatomy. CONTRAST:  75 mL Isovue 370 nonionic COMPARISON:  Chest radiograph December 17, 2016 FINDINGS: Cardiovascular: There is no demonstrable pulmonary embolus. There is no thoracic aortic aneurysm or dissection. Main pulmonary outflow tract measures 3.1 cm in length. There is mild calcification at the origin of the left subclavian artery. Other visualized great vessels appear unremarkable. Pericardium is not appreciably thickened. There is left ventricular hypertrophy. Mediastinum/Nodes: Visualized thyroid appears unremarkable. There is no appreciable thoracic adenopathy. Lungs/Pleura: There is mild bibasilar atelectasis. There is no parenchymal lung edema or consolidation. No pleural effusion or pleural thickening evident. Upper Abdomen: There is diffuse hepatic steatosis. There is an enhancing focus in the anterior segment of the right lobe of the liver measuring 1.7 x 1.7 cm. A similar appearing lesion is noted near the dome of the liver in the medial segment left lobe measuring 1.5 x 1.3 cm. No other focal liver lesions are apparent ; note that liver is incompletely visualized. Visualized upper abdominal structures otherwise appear unremarkable. Musculoskeletal: There is degenerative change in the thoracic spine. There are no blastic or lytic bone lesions. There are several healed rib fractures on the left. Review of the MIP images confirms the above findings. IMPRESSION: No demonstrable pulmonary embolus. Prominence of the main pulmonary outflow tract suggests a degree of pulmonary arterial hypertension. No adenopathy. No lung edema or consolidation.  Bibasilar atelectasis noted. Enhancing lesions in the liver near the dome noted. Etiology uncertain. This finding warrants nonemergent pre and serial post-contrast CT or MR of the liver to further evaluate. Old healed rib fractures on the left. Left ventricular hypertrophy. Electronically Signed   By: Lowella Grip III M.D.   On:  12/17/2016 13:51   Dg Chest Portable 1 View  Result Date: 12/17/2016 CLINICAL DATA:  Shortness of breath and tachycardia EXAM: PORTABLE CHEST 1 VIEW COMPARISON:  05/13/2012 FINDINGS: Cardiomegaly noted. There is no evidence of focal airspace disease, pulmonary edema, suspicious pulmonary nodule/mass, pleural effusion, or pneumothorax. No acute bony abnormalities are identified. IMPRESSION: Cardiomegaly without evidence of acute cardiopulmonary disease. Electronically Signed   By: Margarette Canada M.D.   On: 12/17/2016 12:11   Dg Humerus Right  Result Date: 12/17/2016 CLINICAL DATA:  Acute onset pain while lifting heavy object EXAM: RIGHT HUMERUS - 2+ VIEW COMPARISON:  None. FINDINGS: Frontal and lateral views were obtained. There is an obliquely oriented fracture of the distal humerus with medial angulation and anterior displacement of the distal fracture fragment with respect proximal fragment. No other fractures. No dislocations. No abnormal periosteal reaction or appreciable arthropathy. IMPRESSION: Obliquely oriented fracture distal humerus with displacement angulation of fracture fragments. No dislocation. No abnormal periosteal reaction. No appreciable arthropathic change. Electronically Signed   By: Lowella Grip III M.D.   On: 12/17/2016 11:43    EKG:   Orders placed or performed during the hospital encounter of 12/17/16  . EKG 12-Lead  . EKG 12-Lead    ASSESSMENT AND PLAN:   66 year old female with past medical history significant for osteoarthritis, GERD who has been taking NSAIDS every day and also drinks up to 6 beers per day and ongoing smoking presents to hospital secondary to right arm pain after lifting a bag of food  #1 right distal humerus fracture-likely has underlying osteoporosis. Patient was just lifting a heavy bag of food and heard a pop.. -Currently immobilized in a sling. Appreciate orthopedics consult. -  Patient will need surgery for this fracture. Either inpatient or  outpatient. -Continue pain management  #2 melena-high risk for GI bleed secondary to use of NSAID  regularly as outpatient. -Continue IV Protonix. Appreciate GI consult. -Possible EGD either today or tomorrow - avoid NSAIDS. Keep NPO until then  #3 hypokalemia-being replaced aggressively.  #4 hypertension-no known history. However patient hasn't seen a physician in 15 years. -Started on Norvasc and metoprolol  #5 tobacco use disorder-strongly counseled. Smokes up to 2 packs every day. Started on nicotine patch.  #6 alcohol Abuse-counseled again. Started on CIWA protocol. Has some tremors.  Physical therapy consult once stable     All the records are reviewed and case discussed with Care Management/Social Workerr. Management plans discussed with the patient, family and they are in agreement.  CODE STATUS: Full Code  TOTAL TIME TAKING CARE OF THIS PATIENT: 37 minutes.   POSSIBLE D/C IN 1-2 DAYS, DEPENDING ON CLINICAL CONDITION.   Darbie Biancardi M.D on 12/18/2016 at 12:13 PM  Between 7am to 6pm - Pager - [phone]  After 6pm go to [email or web address] - password [email or web address]  Office  [phone]  CC: Primary care physician; Patient, [name]

## 2016-12-18 NOTE — Op Note (Signed)
[facility] Gastroenterology Patient Name: Taylor Brown Procedure Date: 12/18/2016 12:45 PM MRN: [id number] Account #: [id number] Date of Birth: 06/07/1951 Admit Type: Inpatient Age: 66 Room: [facility] ENDO ROOM 4 Gender: Female Note Status: Finalized Procedure:            Upper GI endoscopy Indications:          Suspected upper gastrointestinal bleeding, Melena Providers:            Lear Ng, MD Medicines:            Propofol per Anesthesia, Monitored Anesthesia Care Complications:        No immediate complications. Procedure:            Pre-Anesthesia Assessment:                       - Prior to the procedure, a History and Physical was                        performed, and patient medications and allergies were                        reviewed. The patient's tolerance of previous                        anesthesia was also reviewed. The risks and benefits of                        the procedure and the sedation options and risks were                        discussed with the patient. All questions were                        answered, and informed consent was obtained. Prior                        Anticoagulants: The patient has taken no previous                        anticoagulant or antiplatelet agents. ASA Grade                        Assessment: III - A patient with severe systemic                        disease. After reviewing the risks and benefits, the                        patient was deemed in satisfactory condition to undergo                        the procedure.                       After obtaining informed consent, the endoscope was                        passed under direct vision. Throughout the procedure,  the patient's blood pressure, pulse, and oxygen                        saturations were monitored continuously. The Endoscope                        was introduced through the mouth, and advanced to the             second part of duodenum. The upper GI endoscopy was                        accomplished without difficulty. The patient tolerated                        the procedure well. Findings:      The examined esophagus was normal.      The Z-line was regular and was found 36 cm from the incisors.      A small hiatal hernia was present.      Segmental moderate inflammation characterized by congestion (edema),       erosions, erythema and serpentine ulcerations was found in the gastric       antrum and in the prepyloric region of the stomach. Biopsies were taken       with a cold forceps for histology. Estimated blood loss was minimal.      The examined duodenum was normal. Impression:           - Normal esophagus.                       - Z-line regular, 36 cm from the incisors.                       - Small hiatal hernia.                       - Acute gastritis. Biopsied.                       - Normal examined duodenum. Recommendation:       - Advance diet as tolerated and clear liquid diet.                       - Await pathology results.                       - Use Protonix (pantoprazole) 40 mg IV daily. Procedure Code(s):    --- Professional ---                       [phone], Esophagogastroduodenoscopy, flexible, transoral;                        with biopsy, single or multiple Diagnosis Code(s):    --- Professional ---                       K92.1, Melena (includes Hematochezia)                       K29.00, Acute gastritis without bleeding                       K44.9, Diaphragmatic hernia without obstruction  or                        gangrene CPT copyright 2016 American Medical Association. All rights reserved. The codes documented in this report are preliminary and upon coder review may  be revised to meet current compliance requirements. Lear Ng, MD 12/18/2016 1:55:03 PM This report has been signed electronically. Number of Addenda: 0 Note Initiated On: 12/18/2016  12:45 PM      [facility]

## 2016-12-18 NOTE — Consult Note (Signed)
[facility] Nurse wound consult note Reason for Consult: Bilateral pretibial erythema (L>R) with pustules (2) noted on right. No ulcerations noted in the malleolar areas. Wound type: suspected venous insufficiency in the absence of ABI/TP studies. Pressure Injury POA: No Measurement: Affected area (erythema) on the left pretibial area measures 4cm x 5cm with no open wounds, affected area on the right pretibial area of the LE measures 3cm x 2cm with two pustules measuring <0.2cm. Wound bed: No wound bed. See description of pustules above. Drainage (amount, consistency, odor) None Periwound:As described above. Dressing procedure/placement/frequency: Patient with indications of  Venous insufficiency in that she is obese, inactive and has reported edema in the bilateral lower extremities, that has resolved with LE elevation while in hospital.  He has hair on the lower extremities and while she has no hemosiderin staining, she does have pretibial erythema.  I will initiate treatment with an antimicrobial dressing changed twice daily over the affected areas (xeroform) and secure with Kerlix roll gauze wrapped from toe to knee topped with an ACE long-stretch elastic bandage for a small degree of compression.  We will change this twice daily to monitor the erythema and pustules while simultaneously elevating the LEs.  I have provided a pressure redistribution chair pad for her use while OOB in chair as when the LEs are elevated, it will increase the pressure on her ischial tuberosities.  It is noted that she will perhaps be placed in an environment where she can get a higher level of care.  Should these areas not resolve with dietary intervention, elevation, topical care and regular hygiene, patient may be a candidate for vascular studies (ABI/TP) which will determine the continuing POC (e.g., weekly Unna's Boots, etc.).  [place] nursing team will not follow, but will remain available to this patient, the nursing and medical  teams.  Please re-consult if needed. Thanks, Maudie Flakes, MSN, RN, [place], Ar[facility] Abbott  Pager# [phone]

## 2016-12-18 NOTE — Brief Op Note (Addendum)
Antral gastritis and clean-based linear ulceration without active bleeding. Biopsies taken. Continue IV PPI today and then change to PO PPI QD when tolerating solid food. Soft diet and advance. F/U on pathology as outpt. F/U with Dr. Vicente Males in 3-4 weeks. Needs outpt colonoscopy. Will sign off. Call if questions.

## 2016-12-19 ENCOUNTER — Encounter: Payer: Self-pay | Admitting: Gastroenterology

## 2016-12-19 ENCOUNTER — Inpatient Hospital Stay (HOSPITAL_COMMUNITY)
Admission: AD | Admit: 2016-12-19 | Discharge: 2016-12-28 | DRG: 492 | Disposition: A | Discharge status: Skilled Nursing Facility | Payer: Medicare HMO | Source: Other Acute Inpatient Hospital | Attending: Internal Medicine | Admitting: Internal Medicine

## 2016-12-19 DIAGNOSIS — S42411A Displaced simple supracondylar fracture without intercondylar fracture of right humerus, initial encounter for closed fracture: Secondary | ICD-10-CM | POA: Diagnosis not present

## 2016-12-19 DIAGNOSIS — C787 Secondary malignant neoplasm of liver and intrahepatic bile duct: Secondary | ICD-10-CM | POA: Diagnosis present

## 2016-12-19 DIAGNOSIS — Q845 Enlarged and hypertrophic nails: Secondary | ICD-10-CM | POA: Diagnosis not present

## 2016-12-19 DIAGNOSIS — M48061 Spinal stenosis, lumbar region without neurogenic claudication: Secondary | ICD-10-CM | POA: Diagnosis not present

## 2016-12-19 DIAGNOSIS — Z419 Encounter for procedure for purposes other than remedying health state, unspecified: Secondary | ICD-10-CM

## 2016-12-19 DIAGNOSIS — M84421S Pathological fracture, right humerus, sequela: Secondary | ICD-10-CM | POA: Diagnosis not present

## 2016-12-19 DIAGNOSIS — Z803 Family history of malignant neoplasm of breast: Secondary | ICD-10-CM | POA: Diagnosis not present

## 2016-12-19 DIAGNOSIS — N649 Disorder of breast, unspecified: Secondary | ICD-10-CM | POA: Diagnosis not present

## 2016-12-19 DIAGNOSIS — C50912 Malignant neoplasm of unspecified site of left female breast: Secondary | ICD-10-CM | POA: Diagnosis present

## 2016-12-19 DIAGNOSIS — G5630 Lesion of radial nerve, unspecified upper limb: Secondary | ICD-10-CM | POA: Diagnosis not present

## 2016-12-19 DIAGNOSIS — S4421XS Injury of radial nerve at upper arm level, right arm, sequela: Secondary | ICD-10-CM | POA: Diagnosis not present

## 2016-12-19 DIAGNOSIS — N390 Urinary tract infection, site not specified: Secondary | ICD-10-CM | POA: Diagnosis present

## 2016-12-19 DIAGNOSIS — N6320 Unspecified lump in the left breast, unspecified quadrant: Secondary | ICD-10-CM | POA: Diagnosis present

## 2016-12-19 DIAGNOSIS — S42411G Displaced simple supracondylar fracture without intercondylar fracture of right humerus, subsequent encounter for fracture with delayed healing: Secondary | ICD-10-CM | POA: Diagnosis not present

## 2016-12-19 DIAGNOSIS — E039 Hypothyroidism, unspecified: Secondary | ICD-10-CM | POA: Diagnosis present

## 2016-12-19 DIAGNOSIS — C50919 Malignant neoplasm of unspecified site of unspecified female breast: Secondary | ICD-10-CM | POA: Diagnosis not present

## 2016-12-19 DIAGNOSIS — M84521A Pathological fracture in neoplastic disease, right humerus, initial encounter for fracture: Secondary | ICD-10-CM | POA: Diagnosis not present

## 2016-12-19 DIAGNOSIS — Z4789 Encounter for other orthopedic aftercare: Secondary | ICD-10-CM | POA: Diagnosis not present

## 2016-12-19 DIAGNOSIS — K922 Gastrointestinal hemorrhage, unspecified: Secondary | ICD-10-CM | POA: Diagnosis not present

## 2016-12-19 DIAGNOSIS — G9340 Encephalopathy, unspecified: Secondary | ICD-10-CM | POA: Diagnosis not present

## 2016-12-19 DIAGNOSIS — S42301A Unspecified fracture of shaft of humerus, right arm, initial encounter for closed fracture: Secondary | ICD-10-CM | POA: Diagnosis not present

## 2016-12-19 DIAGNOSIS — R918 Other nonspecific abnormal finding of lung field: Secondary | ICD-10-CM | POA: Diagnosis not present

## 2016-12-19 DIAGNOSIS — M84421A Pathological fracture, right humerus, initial encounter for fracture: Secondary | ICD-10-CM | POA: Diagnosis not present

## 2016-12-19 DIAGNOSIS — K769 Liver disease, unspecified: Secondary | ICD-10-CM

## 2016-12-19 DIAGNOSIS — R58 Hemorrhage, not elsewhere classified: Secondary | ICD-10-CM

## 2016-12-19 DIAGNOSIS — M4804 Spinal stenosis, thoracic region: Secondary | ICD-10-CM | POA: Diagnosis not present

## 2016-12-19 DIAGNOSIS — S4421XA Injury of radial nerve at upper arm level, right arm, initial encounter: Secondary | ICD-10-CM | POA: Diagnosis not present

## 2016-12-19 DIAGNOSIS — K429 Umbilical hernia without obstruction or gangrene: Secondary | ICD-10-CM | POA: Diagnosis present

## 2016-12-19 DIAGNOSIS — M4802 Spinal stenosis, cervical region: Secondary | ICD-10-CM | POA: Diagnosis not present

## 2016-12-19 DIAGNOSIS — Z853 Personal history of malignant neoplasm of breast: Secondary | ICD-10-CM | POA: Diagnosis not present

## 2016-12-19 DIAGNOSIS — C7949 Secondary malignant neoplasm of other parts of nervous system: Secondary | ICD-10-CM

## 2016-12-19 DIAGNOSIS — C7800 Secondary malignant neoplasm of unspecified lung: Secondary | ICD-10-CM | POA: Diagnosis not present

## 2016-12-19 DIAGNOSIS — E876 Hypokalemia: Secondary | ICD-10-CM | POA: Diagnosis present

## 2016-12-19 DIAGNOSIS — Z720 Tobacco use: Secondary | ICD-10-CM | POA: Diagnosis not present

## 2016-12-19 DIAGNOSIS — I10 Essential (primary) hypertension: Secondary | ICD-10-CM | POA: Diagnosis not present

## 2016-12-19 DIAGNOSIS — F1020 Alcohol dependence, uncomplicated: Secondary | ICD-10-CM | POA: Diagnosis present

## 2016-12-19 DIAGNOSIS — C7951 Secondary malignant neoplasm of bone: Secondary | ICD-10-CM | POA: Diagnosis not present

## 2016-12-19 DIAGNOSIS — Z79899 Other long term (current) drug therapy: Secondary | ICD-10-CM

## 2016-12-19 DIAGNOSIS — R41841 Cognitive communication deficit: Secondary | ICD-10-CM | POA: Diagnosis not present

## 2016-12-19 DIAGNOSIS — I472 Ventricular tachycardia: Secondary | ICD-10-CM | POA: Diagnosis present

## 2016-12-19 DIAGNOSIS — C50412 Malignant neoplasm of upper-outer quadrant of left female breast: Secondary | ICD-10-CM | POA: Diagnosis not present

## 2016-12-19 DIAGNOSIS — M7989 Other specified soft tissue disorders: Secondary | ICD-10-CM | POA: Diagnosis not present

## 2016-12-19 DIAGNOSIS — F10239 Alcohol dependence with withdrawal, unspecified: Secondary | ICD-10-CM | POA: Diagnosis present

## 2016-12-19 DIAGNOSIS — S5291XA Unspecified fracture of right forearm, initial encounter for closed fracture: Secondary | ICD-10-CM | POA: Diagnosis not present

## 2016-12-19 DIAGNOSIS — R69 Illness, unspecified: Secondary | ICD-10-CM | POA: Diagnosis not present

## 2016-12-19 DIAGNOSIS — S42411D Displaced simple supracondylar fracture without intercondylar fracture of right humerus, subsequent encounter for fracture with routine healing: Secondary | ICD-10-CM | POA: Diagnosis not present

## 2016-12-19 DIAGNOSIS — F10231 Alcohol dependence with withdrawal delirium: Secondary | ICD-10-CM | POA: Diagnosis not present

## 2016-12-19 DIAGNOSIS — K2970 Gastritis, unspecified, without bleeding: Secondary | ICD-10-CM | POA: Diagnosis present

## 2016-12-19 DIAGNOSIS — G92 Toxic encephalopathy: Secondary | ICD-10-CM | POA: Diagnosis present

## 2016-12-19 DIAGNOSIS — D62 Acute posthemorrhagic anemia: Secondary | ICD-10-CM | POA: Diagnosis not present

## 2016-12-19 DIAGNOSIS — R278 Other lack of coordination: Secondary | ICD-10-CM | POA: Diagnosis not present

## 2016-12-19 DIAGNOSIS — K2921 Alcoholic gastritis with bleeding: Secondary | ICD-10-CM | POA: Diagnosis not present

## 2016-12-19 DIAGNOSIS — G5631 Lesion of radial nerve, right upper limb: Secondary | ICD-10-CM | POA: Diagnosis present

## 2016-12-19 DIAGNOSIS — F17210 Nicotine dependence, cigarettes, uncomplicated: Secondary | ICD-10-CM | POA: Diagnosis present

## 2016-12-19 DIAGNOSIS — S42309A Unspecified fracture of shaft of humerus, unspecified arm, initial encounter for closed fracture: Secondary | ICD-10-CM | POA: Diagnosis not present

## 2016-12-19 DIAGNOSIS — Z23 Encounter for immunization: Secondary | ICD-10-CM | POA: Diagnosis present

## 2016-12-19 DIAGNOSIS — G8918 Other acute postprocedural pain: Secondary | ICD-10-CM | POA: Diagnosis not present

## 2016-12-19 DIAGNOSIS — S42401A Unspecified fracture of lower end of right humerus, initial encounter for closed fracture: Secondary | ICD-10-CM | POA: Diagnosis not present

## 2016-12-19 DIAGNOSIS — K7689 Other specified diseases of liver: Secondary | ICD-10-CM | POA: Diagnosis not present

## 2016-12-19 DIAGNOSIS — I5030 Unspecified diastolic (congestive) heart failure: Secondary | ICD-10-CM | POA: Diagnosis not present

## 2016-12-19 DIAGNOSIS — Z9181 History of falling: Secondary | ICD-10-CM | POA: Diagnosis not present

## 2016-12-19 DIAGNOSIS — R0602 Shortness of breath: Secondary | ICD-10-CM

## 2016-12-19 DIAGNOSIS — F1010 Alcohol abuse, uncomplicated: Secondary | ICD-10-CM | POA: Diagnosis present

## 2016-12-19 DIAGNOSIS — M84422A Pathological fracture, left humerus, initial encounter for fracture: Secondary | ICD-10-CM | POA: Diagnosis not present

## 2016-12-19 DIAGNOSIS — L600 Ingrowing nail: Secondary | ICD-10-CM | POA: Diagnosis not present

## 2016-12-19 DIAGNOSIS — R2681 Unsteadiness on feet: Secondary | ICD-10-CM | POA: Diagnosis not present

## 2016-12-19 DIAGNOSIS — X509XXA Other and unspecified overexertion or strenuous movements or postures, initial encounter: Secondary | ICD-10-CM | POA: Diagnosis not present

## 2016-12-19 DIAGNOSIS — M6281 Muscle weakness (generalized): Secondary | ICD-10-CM | POA: Diagnosis not present

## 2016-12-19 DIAGNOSIS — K760 Fatty (change of) liver, not elsewhere classified: Secondary | ICD-10-CM | POA: Diagnosis not present

## 2016-12-19 DIAGNOSIS — N6321 Unspecified lump in the left breast, upper outer quadrant: Secondary | ICD-10-CM | POA: Diagnosis not present

## 2016-12-19 DIAGNOSIS — N63 Unspecified lump in unspecified breast: Secondary | ICD-10-CM | POA: Diagnosis not present

## 2016-12-19 DIAGNOSIS — C801 Malignant (primary) neoplasm, unspecified: Secondary | ICD-10-CM | POA: Diagnosis not present

## 2016-12-19 HISTORY — DX: Unspecified fracture of shaft of humerus, unspecified arm, initial encounter for closed fracture: S42.309A

## 2016-12-19 HISTORY — DX: Nicotine dependence, unspecified, uncomplicated: F17.200

## 2016-12-19 HISTORY — DX: Unspecified lump in the left breast, unspecified quadrant: N63.20

## 2016-12-19 HISTORY — DX: Malignant (primary) neoplasm, unspecified: C80.1

## 2016-12-19 HISTORY — DX: Lesion of radial nerve, right upper limb: G56.31

## 2016-12-19 HISTORY — DX: Liver disease, unspecified: K76.9

## 2016-12-19 LAB — CBC
HCT: 40.4 % (ref 35.0–47.0)
HEMOGLOBIN: 14 g/dL (ref 12.0–16.0)
MCH: 36.2 pg — ABNORMAL HIGH (ref 26.0–34.0)
MCHC: 34.5 g/dL (ref 32.0–36.0)
MCV: 104.7 fL — AB (ref 80.0–100.0)
Platelets: 171 10*3/uL (ref 150–440)
RBC: 3.86 MIL/uL (ref 3.80–5.20)
RDW: 15.7 % — AB (ref 11.5–14.5)
WBC: 8.2 10*3/uL (ref 3.6–11.0)

## 2016-12-19 LAB — ABO/RH: ABO/RH(D): O POS

## 2016-12-19 LAB — MAGNESIUM: Magnesium: 1.5 mg/dL — ABNORMAL LOW (ref 1.7–2.4)

## 2016-12-19 LAB — BASIC METABOLIC PANEL
Anion gap: 11 (ref 5–15)
BUN: 7 mg/dL (ref 6–20)
CHLORIDE: 98 mmol/L — AB (ref 101–111)
CO2: 27 mmol/L (ref 22–32)
Calcium: 8.4 mg/dL — ABNORMAL LOW (ref 8.9–10.3)
Creatinine, Ser: 0.46 mg/dL (ref 0.44–1.00)
GFR calc Af Amer: >60 mL/min (ref >60)
GFR calc non Af Amer: >60 mL/min (ref >60)
Glucose, Bld: 110 mg/dL — ABNORMAL HIGH (ref 65–99)
POTASSIUM: 3.5 mmol/L (ref 3.5–5.1)
Sodium: 136 mmol/L (ref 135–145)

## 2016-12-19 LAB — MRSA PCR SCREENING: MRSA by PCR: NEGATIVE

## 2016-12-19 LAB — TYPE AND SCREEN
ABO/RH(D): O POS
ANTIBODY SCREEN: NEGATIVE

## 2016-12-19 MED ORDER — POTASSIUM CHLORIDE IN NACL 20-0.9 MEQ/L-% IV SOLN
INTRAVENOUS | Status: DC
  Administered 2016-12-19: 17:00:00 via INTRAVENOUS
  Filled 2016-12-19 (×3): qty 1000

## 2016-12-19 MED ORDER — LORAZEPAM 2 MG/ML IJ SOLN: 1.0000 mg | Freq: Four times a day (QID) | INTRAMUSCULAR | Status: AC | PRN

## 2016-12-19 MED ORDER — METOPROLOL TARTRATE 25 MG PO TABS
25.0000 mg | ORAL_TABLET | Freq: Two times a day (BID) | ORAL | Status: DC
  Administered 2016-12-19: 25 mg via ORAL
  Filled 2016-12-19 (×2): qty 1

## 2016-12-19 MED ORDER — HYDROCODONE-ACETAMINOPHEN 7.5-325 MG PO TABS: 1.0000 | ORAL_TABLET | Freq: Four times a day (QID) | ORAL | Status: DC | PRN

## 2016-12-19 MED ORDER — MAGNESIUM SULFATE 2 GM/50ML IV SOLN
2.0000 g | Freq: Once | INTRAVENOUS | Status: AC
  Administered 2016-12-19: 2 g via INTRAVENOUS
  Filled 2016-12-19: qty 50

## 2016-12-19 MED ORDER — ACETAMINOPHEN 325 MG PO TABS: 650.0000 mg | ORAL_TABLET | Freq: Four times a day (QID) | ORAL | Status: DC | PRN

## 2016-12-19 MED ORDER — METOPROLOL TARTRATE 25 MG PO TABS: 25.0000 mg | ORAL_TABLET | Freq: Two times a day (BID) | ORAL | 0 refills | Status: DC

## 2016-12-19 MED ORDER — LORAZEPAM 1 MG PO TABS: 1.0000 mg | ORAL_TABLET | Freq: Four times a day (QID) | ORAL | Status: AC | PRN

## 2016-12-19 MED ORDER — METOCLOPRAMIDE HCL 5 MG/ML IJ SOLN
5.0000 mg | Freq: Three times a day (TID) | INTRAMUSCULAR | Status: DC | PRN
  Filled 2016-12-19: qty 2

## 2016-12-19 MED ORDER — PANTOPRAZOLE SODIUM 40 MG PO TBEC
40.0000 mg | DELAYED_RELEASE_TABLET | Freq: Two times a day (BID) | ORAL | Status: DC
  Administered 2016-12-19 – 2016-12-28 (×16): 40 mg via ORAL
  Filled 2016-12-19 (×17): qty 1

## 2016-12-19 MED ORDER — ADULT MULTIVITAMIN W/MINERALS CH
1.0000 | ORAL_TABLET | Freq: Every day | ORAL | Status: DC
  Administered 2016-12-19 – 2016-12-28 (×8): 1 via ORAL
  Filled 2016-12-19 (×9): qty 1

## 2016-12-19 MED ORDER — VITAMIN B-1 100 MG PO TABS
100.0000 mg | ORAL_TABLET | Freq: Every day | ORAL | Status: DC
  Administered 2016-12-19 – 2016-12-28 (×8): 100 mg via ORAL
  Filled 2016-12-19 (×9): qty 1

## 2016-12-19 MED ORDER — MORPHINE SULFATE (PF) 4 MG/ML IV SOLN
1.0000 mg | INTRAVENOUS | Status: DC | PRN
  Administered 2016-12-24 – 2016-12-28 (×9): 2 mg via INTRAVENOUS
  Filled 2016-12-19 (×9): qty 1

## 2016-12-19 MED ORDER — LORAZEPAM 1 MG PO TABS: 0.0000 mg | ORAL_TABLET | Freq: Two times a day (BID) | ORAL | Status: AC

## 2016-12-19 MED ORDER — OXYCODONE HCL 5 MG PO TABS: 5.0000 mg | ORAL_TABLET | ORAL | Status: DC | PRN

## 2016-12-19 MED ORDER — THIAMINE HCL 100 MG/ML IJ SOLN: 100.0000 mg | Freq: Every day | INTRAMUSCULAR | Status: DC

## 2016-12-19 MED ORDER — PANTOPRAZOLE SODIUM 40 MG PO TBEC: 40.0000 mg | DELAYED_RELEASE_TABLET | Freq: Two times a day (BID) | ORAL | Status: DC

## 2016-12-19 MED ORDER — ACETAMINOPHEN 650 MG RE SUPP: 650.0000 mg | Freq: Four times a day (QID) | RECTAL | Status: DC | PRN

## 2016-12-19 MED ORDER — DOCUSATE SODIUM 100 MG PO CAPS
100.0000 mg | ORAL_CAPSULE | Freq: Two times a day (BID) | ORAL | Status: DC
  Administered 2016-12-19 – 2016-12-24 (×6): 100 mg via ORAL
  Filled 2016-12-19 (×9): qty 1

## 2016-12-19 MED ORDER — PANTOPRAZOLE SODIUM 40 MG PO TBEC: 40.0000 mg | DELAYED_RELEASE_TABLET | Freq: Two times a day (BID) | ORAL | 1 refills | Status: DC

## 2016-12-19 MED ORDER — METOCLOPRAMIDE HCL 5 MG PO TABS
5.0000 mg | ORAL_TABLET | Freq: Three times a day (TID) | ORAL | Status: DC | PRN
  Filled 2016-12-19: qty 2

## 2016-12-19 MED ORDER — AMLODIPINE BESYLATE 5 MG PO TABS: 5.0000 mg | ORAL_TABLET | Freq: Every day | ORAL | 0 refills | Status: DC

## 2016-12-19 MED ORDER — LORAZEPAM 1 MG PO TABS
0.0000 mg | ORAL_TABLET | Freq: Four times a day (QID) | ORAL | Status: AC
  Administered 2016-12-19 – 2016-12-20 (×3): 1 mg via ORAL
  Filled 2016-12-19 (×3): qty 1

## 2016-12-19 MED ORDER — CEFAZOLIN SODIUM-DEXTROSE 2-4 GM/100ML-% IV SOLN
2.0000 g | INTRAVENOUS | Status: DC
  Filled 2016-12-19: qty 100

## 2016-12-19 MED ORDER — CHLORHEXIDINE GLUCONATE 4 % EX LIQD: 60.0000 mL | Freq: Once | CUTANEOUS | Status: DC

## 2016-12-19 MED ORDER — METHOCARBAMOL 1000 MG/10ML IJ SOLN
500.0000 mg | Freq: Four times a day (QID) | INTRAMUSCULAR | Status: DC | PRN
Start: 2016-12-19 — End: 2016-12-28
  Filled 2016-12-19: qty 5

## 2016-12-19 MED ORDER — POVIDONE-IODINE 10 % EX SWAB: 2.0000 "application " | Freq: Once | CUTANEOUS | Status: DC

## 2016-12-19 MED ORDER — FOLIC ACID 1 MG PO TABS
1.0000 mg | ORAL_TABLET | Freq: Every day | ORAL | Status: DC
  Administered 2016-12-19 – 2016-12-28 (×8): 1 mg via ORAL
  Filled 2016-12-19 (×9): qty 1

## 2016-12-19 MED ORDER — METHOCARBAMOL 500 MG PO TABS
500.0000 mg | ORAL_TABLET | Freq: Four times a day (QID) | ORAL | Status: DC | PRN
  Administered 2016-12-23 – 2016-12-28 (×7): 500 mg via ORAL
  Filled 2016-12-19 (×8): qty 1

## 2016-12-19 NOTE — Progress Notes (Signed)
Subjective:  Some patient and her hospital room. Patient is lying in bed. She remains in a shoulder immobilizer. Patient reports pain as mild to moderate.    Objective:   VITALS:   Vitals:   12/19/16 0513 12/19/16 1126 12/19/16 1237 12/19/16 1238  BP: (!) 158/92 (!) 175/88 (!) 175/90 (!) 164/98  Pulse: (!) 106 (!) 116 (!) 103 (!) 106  Resp: 19  18   Temp: 98.7 F (37.1 C)  99 F (37.2 C)   TempSrc: Oral  Oral   SpO2: 94%  93%   Weight:      Height:        PHYSICAL EXAM:  Right upper extremity:  Patient's skin remains intact. She has moderate swelling on the right elbow. Her arm and forearm compartments are soft and compressible. Patient today however is unable to extend her fingers or wrist indicating a radial nerve palsy. Patient has a palpable radial pulse and her fingers are well-perfused.   LABS  Results for orders placed or performed during the hospital encounter of 12/17/16 (from the past 24 hour(s))  Hemoglobin     Status: None   Collection Time: 12/18/16  3:20 PM  Result Value Ref Range   Hemoglobin 14.0 12.0 - 16.0 g/dL  Magnesium     Status: Abnormal   Collection Time: 12/18/16  3:20 PM  Result Value Ref Range   Magnesium 1.5 (L) 1.7 - 2.4 mg/dL  Magnesium     Status: Abnormal   Collection Time: 12/19/16  4:28 AM  Result Value Ref Range   Magnesium 1.5 (L) 1.7 - 2.4 mg/dL  Basic metabolic panel     Status: Abnormal   Collection Time: 12/19/16  4:28 AM  Result Value Ref Range   Sodium 136 135 - 145 mmol/L   Potassium 3.5 3.5 - 5.1 mmol/L   Chloride 98 (L) 101 - 111 mmol/L   CO2 27 22 - 32 mmol/L   Glucose, Bld 110 (H) 65 - 99 mg/dL   BUN 7 6 - 20 mg/dL   Creatinine, Ser 0.46 0.44 - 1.00 mg/dL   Calcium 8.4 (L) 8.9 - 10.3 mg/dL   GFR calc non Af Amer >60 >60 mL/min   GFR calc Af Amer >60 >60 mL/min   Anion gap 11 5 - 15  CBC     Status: Abnormal   Collection Time: 12/19/16  4:28 AM  Result Value Ref Range   WBC 8.2 3.6 - 11.0 K/uL   RBC 3.86 3.80 -  5.20 MIL/uL   Hemoglobin 14.0 12.0 - 16.0 g/dL   HCT 40.4 35.0 - 47.0 %   MCV 104.7 (H) 80.0 - 100.0 fL   MCH 36.2 (H) 26.0 - 34.0 pg   MCHC 34.5 32.0 - 36.0 g/dL   RDW 15.7 (H) 11.5 - 14.5 %   Platelets 171 150 - 440 K/uL    No results found.  Assessment/Plan: 1 Day Post-Op   Principal Problem:   Hematochezia Active Problems:   GI bleed  Patient has had loss of radial nerve function since yesterday. Patient will require surgery for her fracture of the distal humerus and now requires radial nerve exploration during surgery. This intensifies the level of difficulty of this fracture. I feel the patient needs a orthopedic trauma specialist for this injury. I have spoken with Dr. Alt[place]e [place] the orthopedic traumatologist at [facility].  He has agreed to take care of this patient and would like her transferred to [facility] today for  surgery tomorrow. Patient will be nothing by mouth after midnight. Surgery will be scheduled for tomorrow. I spoke with Dr. Fritzi Mandes the hospitalist. [facility] who will help to arrange for transfer to [facility].      [place] , MD 12/19/2016, 1:43 PM

## 2016-12-19 NOTE — Care Management (Signed)
Admitted to [facility] with the diagnosis of hematochezia. Lives alone. Daughter is Anderson [place] [phone]). Hasn't seen a primary care physician in 15 years, Prescriptions are filled at [facility] in [place]. Takes care of all basic activities of daily living herself, drives. No home health. No skilled facility. No home oxygen.Does have a wheelchair and rolling walker in the home. No falls. Fair appetite.  Upper Endoscopy completed today. Will follow-up with Dr. Vicente Males in 3-4 weeks Discharge 12/20/16 per Dr. Reginia Forts RN MSN CCM Care Management [phone]

## 2016-12-19 NOTE — Clinical Social Work Note (Signed)
CSW was informed that pt will be transferred to [facility] today for surgery. CSW updated and handed off to 5N CSW of transfer to follow up on the self-neglect consult. Full assessment to follow. 5N CSW to follow.   Darden Dates, MSW, LCSW  Clinical Social Worker  [phone]

## 2016-12-19 NOTE — Progress Notes (Signed)
Called report to GInger, RN at [facility]. PT transported via [place].

## 2016-12-19 NOTE — Progress Notes (Signed)
Changed dressing on bilateral legs

## 2016-12-19 NOTE — Discharge Summary (Signed)
Taylor Brown at [place] NAME: Taylor Brown    MR#:  [id number]  DATE OF BIRTH:  Aug 26, 1950  DATE OF ADMISSION:  12/17/2016 ADMITTING PHYSICIAN: Taylor Basta, MD  DATE OF DISCHARGE: 12/19/16  PRIMARY CARE PHYSICIAN: Taylor Brown    ADMISSION DIAGNOSIS:  Dyspnea [R06.00] Acute upper GI bleed [K92.2] Closed displaced oblique fracture of shaft of right humerus, initial encounter [S42.331A]  DISCHARGE DIAGNOSIS:  Closed Displaced FRACTURE of the shaft of the right humerus with suspected radial nerve injury. GI bleed secondary to antral gastritis due to NSAID use Hypertension new Diagnoses SECONDARY DIAGNOSIS:   Past Medical History:  Diagnosis Date  . Arthritis    osteo  . GERD (gastroesophageal reflux disease)     HOSPITAL COURSE:  66 year old female with past medical history significant for osteoarthritis, GERD who has been taking NSAIDS every day and also drinks up to 6 beers Brown day and ongoing smoking presents to hospital secondary to right arm pain after lifting a bag of food  #1 right distal humerus fracture-likely has underlying osteoporosis. Patient was just lifting a heavy bag of food and heard a pop.. -Currently immobilized in a sling. Appreciate orthopedics consult with Dr. Mack Brown who spoke with Taylor Brown Brown orthopedic at [facility] for transfer of patient given symptoms of radial nerve injury requiring complex surgery. Patient has agreed to transfer to [facility] -Continue pain management  #2 melena-high risk for GI bleed secondary to use of NSAID  -Received IV Protonix. Appreciate GI consult. -Status post EGD that showed antral gastritis. Protonix by mouth twice a day. Hemoglobin stable. - avoid NSAIDS.  -Started on clear liquids  #3 hypokalemia-being replaced aggressively.  #4 hypertension-no known history. However patient hasn't seen a physician in 15 years. -Started on Norvasc and  metoprolol  #5 tobacco use disorder-strongly counseled. Smokes up to 2 packs every day. Started on nicotine patch.  #6 alcohol Abuse-counseled again. Started on CIWA protocol. Has some tremors. CIWA scoring 2  Was transferred to [facility] once bed available. Accepting physician Taylor Brown Brown orthopedic/trauma This message was relayed to me by Dr. Mack Brown orthopedic.   CONSULTS OBTAINED:  Treatment Team:  Taylor Park, MD Taylor Bellows, MD  DRUG ALLERGIES:  No Known Allergies  DISCHARGE MEDICATIONS:   Current Discharge Medication List    START taking these medications   Details  amLODipine (NORVASC) 5 MG tablet Take 1 tablet (5 mg total) by mouth daily. Qty: 30 tablet, Refills: 0    metoprolol tartrate (LOPRESSOR) 25 MG tablet Take 1 tablet (25 mg total) by mouth 2 (two) times daily. Qty: 60 tablet, Refills: 0    pantoprazole (PROTONIX) 40 MG tablet Take 1 tablet (40 mg total) by mouth 2 (two) times daily before a meal. Qty: 60 tablet, Refills: 1      CONTINUE these medications which have NOT CHANGED   Details  esomeprazole (NEXIUM) 20 MG capsule Take 20 mg by mouth as needed.    loratadine (CLARITIN) 10 MG tablet Take 10 mg by mouth daily as needed. For allergies    Multiple Vitamin (MULTIVITAMIN WITH MINERALS) TABS Take 1 tablet by mouth daily. Qty: 30 tablet, Refills: 0    naproxen sodium (ANAPROX) 220 MG tablet Take 220 mg by mouth 2 (two) times daily with a meal.    docusate sodium 100 MG CAPS Take 100 mg by mouth 2 (two) times daily. Qty: 30 capsule, Refills: 0    oxyCODONE (OXY  IR/ROXICODONE) 5 MG immediate release tablet Take 1-2 tablets (5-10 mg total) by mouth every 3 (three) hours as needed. Qty: 40 tablet, Refills: 0    senna (SENOKOT) 8.6 MG TABS Take 1 tablet (8.6 mg total) by mouth 2 (two) times daily. Qty: 60 tablet, Refills: 0      STOP taking these medications     enoxaparin (LOVENOX) 40 MG/0.4ML injection         If you  experience worsening of your admission symptoms, develop shortness of breath, life threatening emergency, suicidal or homicidal thoughts you must seek medical attention immediately by calling 911 or calling your MD immediately  if symptoms less severe.  You Must read complete instructions/literature along with all the possible adverse reactions/side effects for all the Medicines you take and that have been prescribed to you. Take any new Medicines after you have completely understood and accept all the possible adverse reactions/side effects.   Please note  You were cared for by a hospitalist during your hospital stay. If you have any questions about your discharge medications or the care you received while you were in the hospital after you are discharged, you can call the unit and asked to speak with the hospitalist on call if the hospitalist that took care of you is not available. Once you are discharged, your primary care physician will handle any further medical issues. Please note that NO REFILLS for any discharge medications will be authorized once you are discharged, as it is imperative that you return to your primary care physician (or establish a relationship with a primary care physician if you do not have one) for your aftercare needs so that they can reassess your need for medications and monitor your lab values. Today   SUBJECTIVE   No new complaints  VITAL SIGNS:  Blood pressure (!) 164/98, pulse (!) 106, temperature 99 F (37.2 C), temperature source Oral, resp. rate 18, height 5' (1.524 m), weight 88.5 kg (195 lb), SpO2 93 %.  I/O:   Intake/Output Summary (Last 24 hours) at 12/19/16 1333 Last data filed at 12/19/16 1249  Gross Brown 24 hour  Intake             3566 ml  Output                0 ml  Net             3566 ml    PHYSICAL EXAMINATION:  GENERAL:  66 y.o.-year-old patient lying in the bed with no acute distress.  EYES: Pupils equal, round, reactive to light and  accommodation. No scleral icterus. Extraocular muscles intact.  HEENT: Head atraumatic, normocephalic. Oropharynx and nasopharynx clear.  NECK:  Supple, no jugular venous distention. No thyroid enlargement, no tenderness.  LUNGS: Normal breath sounds bilaterally, no wheezing, rales,rhonchi or crepitation. No use of accessory muscles of respiration.  CARDIOVASCULAR: S1, S2 normal. No murmurs, rubs, or gallops.  ABDOMEN: Soft, non-tender, non-distended. Bowel sounds present. No organomegaly or mass.  EXTREMITIES: No pedal edema, cyanosis, or clubbing. Right UE sling+ NEUROLOGIC: Cranial nerves II through XII are intact. Muscle strength 5/5 in all extremities. Sensation intact. Gait not checked.  PSYCHIATRIC: The patient is alert and oriented x 3.  SKIN: No obvious rash, lesion, or ulcer.   DATA REVIEW:   CBC   Recent Labs Lab 12/19/16 0428  WBC 8.2  HGB 14.0  HCT 40.4  PLT 171    Chemistries   Recent Labs Lab 12/17/16 1153  12/19/16 0428  NA 143  < > 136  K 2.6*  < > 3.5  CL 99*  < > 98*  CO2 26  < > 27  GLUCOSE 121*  < > 110*  BUN 8  < > 7  CREATININE 0.54  < > 0.46  CALCIUM 8.5*  < > 8.4*  MG 1.5*  < > 1.5*  AST 89*  --   --   ALT 53  --   --   ALKPHOS 134*  --   --   BILITOT 1.1  --   --   < > = values in this interval not displayed.  Microbiology Results   No results found for this or any previous visit (from the past 240 hour(s)).  RADIOLOGY:  Ct Angio Chest Pe W Or Wo Contrast  Result Date: 12/17/2016 CLINICAL DATA:  Shortness of Breath EXAM: CT ANGIOGRAPHY CHEST WITH CONTRAST TECHNIQUE: Multidetector CT imaging of the chest was performed using the standard protocol during bolus administration of intravenous contrast. Multiplanar CT image reconstructions and MIPs were obtained to evaluate the vascular anatomy. CONTRAST:  75 mL Isovue 370 nonionic COMPARISON:  Chest radiograph December 17, 2016 FINDINGS: Cardiovascular: There is no demonstrable pulmonary embolus.  There is no thoracic aortic aneurysm or dissection. Main pulmonary outflow tract measures 3.1 cm in length. There is mild calcification at the origin of the left subclavian artery. Other visualized great vessels appear unremarkable. Pericardium is not appreciably thickened. There is left ventricular hypertrophy. Mediastinum/Nodes: Visualized thyroid appears unremarkable. There is no appreciable thoracic adenopathy. Lungs/Pleura: There is mild bibasilar atelectasis. There is no parenchymal lung edema or consolidation. No pleural effusion or pleural thickening evident. Upper Abdomen: There is diffuse hepatic steatosis. There is an enhancing focus in the anterior segment of the right lobe of the liver measuring 1.7 x 1.7 cm. A similar appearing lesion is noted near the dome of the liver in the medial segment left lobe measuring 1.5 x 1.3 cm. No other focal liver lesions are apparent ; note that liver is incompletely visualized. Visualized upper abdominal structures otherwise appear unremarkable. Musculoskeletal: There is degenerative change in the thoracic spine. There are no blastic or lytic bone lesions. There are several healed rib fractures on the left. Review of the MIP images confirms the above findings. IMPRESSION: No demonstrable pulmonary embolus. Prominence of the main pulmonary outflow tract suggests a degree of pulmonary arterial hypertension. No adenopathy. No lung edema or consolidation.  Bibasilar atelectasis noted. Enhancing lesions in the liver near the dome noted. Etiology uncertain. This finding warrants nonemergent pre and serial post-contrast CT or MR of the liver to further evaluate. Old healed rib fractures on the left. Left ventricular hypertrophy. Electronically Signed   By: Lowella Grip III M.D.   On: 12/17/2016 13:51     Management plans discussed with the patient, family and they are in agreement.  CODE STATUS:     Code Status Orders        Start     Ordered   12/17/16  1724  Full code  Continuous     12/17/16 1723    Code Status History    Date Active Date Inactive Code Status Order ID Comments User Context   05/12/2012  4:28 AM 05/14/2012  5:50 PM Full Code [id number]  Peggye Pitt, RN Inpatient      TOTAL TIME TAKING CARE OF THIS PATIENT: *40 minutes.    Maddilynn Esperanza M.D on 12/19/2016 at 1:33 PM  Between 7am  to 6pm - Pager - [phone] After 6pm go to [email or web address] - password [email or web address] [facility]  Office  [phone]  CC: Primary care physician; Taylor Brown

## 2016-12-20 ENCOUNTER — Inpatient Hospital Stay (HOSPITAL_COMMUNITY): Payer: Medicare HMO

## 2016-12-20 ENCOUNTER — Encounter (HOSPITAL_COMMUNITY): Payer: Self-pay | Admitting: Physician Assistant

## 2016-12-20 ENCOUNTER — Inpatient Hospital Stay: Admit: 2016-12-20 | Payer: Medicare HMO | Admitting: Orthopedic Surgery

## 2016-12-20 DIAGNOSIS — I5030 Unspecified diastolic (congestive) heart failure: Secondary | ICD-10-CM

## 2016-12-20 DIAGNOSIS — F1010 Alcohol abuse, uncomplicated: Secondary | ICD-10-CM | POA: Diagnosis present

## 2016-12-20 DIAGNOSIS — G9340 Encephalopathy, unspecified: Secondary | ICD-10-CM | POA: Diagnosis present

## 2016-12-20 DIAGNOSIS — G5631 Lesion of radial nerve, right upper limb: Secondary | ICD-10-CM

## 2016-12-20 DIAGNOSIS — F17210 Nicotine dependence, cigarettes, uncomplicated: Secondary | ICD-10-CM

## 2016-12-20 DIAGNOSIS — F17200 Nicotine dependence, unspecified, uncomplicated: Secondary | ICD-10-CM

## 2016-12-20 DIAGNOSIS — S42411D Displaced simple supracondylar fracture without intercondylar fracture of right humerus, subsequent encounter for fracture with routine healing: Secondary | ICD-10-CM

## 2016-12-20 DIAGNOSIS — K769 Liver disease, unspecified: Secondary | ICD-10-CM

## 2016-12-20 DIAGNOSIS — E876 Hypokalemia: Secondary | ICD-10-CM | POA: Diagnosis present

## 2016-12-20 DIAGNOSIS — I10 Essential (primary) hypertension: Secondary | ICD-10-CM | POA: Diagnosis present

## 2016-12-20 HISTORY — DX: Nicotine dependence, unspecified, uncomplicated: F17.200

## 2016-12-20 HISTORY — DX: Lesion of radial nerve, right upper limb: G56.31

## 2016-12-20 HISTORY — DX: Liver disease, unspecified: K76.9

## 2016-12-20 LAB — URINALYSIS, ROUTINE W REFLEX MICROSCOPIC
BACTERIA UA: NONE SEEN
Bilirubin Urine: NEGATIVE
Glucose, UA: NEGATIVE mg/dL
KETONES UR: 80 mg/dL — AB
Leukocytes, UA: NEGATIVE
Nitrite: NEGATIVE
PROTEIN: NEGATIVE mg/dL
Specific Gravity, Urine: 1.014 (ref 1.005–1.030)
pH: 6 (ref 5.0–8.0)

## 2016-12-20 LAB — CBC
HCT: 42.2 % (ref 36.0–46.0)
Hemoglobin: 13.6 g/dL (ref 12.0–15.0)
MCH: 34.5 pg — AB (ref 26.0–34.0)
MCHC: 32.2 g/dL (ref 30.0–36.0)
MCV: 107.1 fL — ABNORMAL HIGH (ref 78.0–100.0)
PLATELETS: 168 10*3/uL (ref 150–400)
RBC: 3.94 MIL/uL (ref 3.87–5.11)
RDW: 14.9 % (ref 11.5–15.5)
WBC: 7.9 10*3/uL (ref 4.0–10.5)

## 2016-12-20 LAB — COMPREHENSIVE METABOLIC PANEL
ALT: 36 U/L (ref 14–54)
ANION GAP: 13 (ref 5–15)
AST: 40 U/L (ref 15–41)
Albumin: 2.7 g/dL — ABNORMAL LOW (ref 3.5–5.0)
Alkaline Phosphatase: 116 U/L (ref 38–126)
BUN: 6 mg/dL (ref 6–20)
CALCIUM: 8.1 mg/dL — AB (ref 8.9–10.3)
CO2: 24 mmol/L (ref 22–32)
Chloride: 98 mmol/L — ABNORMAL LOW (ref 101–111)
Creatinine, Ser: 0.51 mg/dL (ref 0.44–1.00)
GFR calc non Af Amer: >60 mL/min (ref >60)
Glucose, Bld: 109 mg/dL — ABNORMAL HIGH (ref 65–99)
Potassium: 3.3 mmol/L — ABNORMAL LOW (ref 3.5–5.1)
SODIUM: 135 mmol/L (ref 135–145)
Total Bilirubin: 1.4 mg/dL — ABNORMAL HIGH (ref 0.3–1.2)
Total Protein: 6 g/dL — ABNORMAL LOW (ref 6.5–8.1)

## 2016-12-20 LAB — MAGNESIUM: MAGNESIUM: 2 mg/dL (ref 1.7–2.4)

## 2016-12-20 LAB — GLUCOSE, CAPILLARY: Glucose-Capillary: 103 mg/dL — ABNORMAL HIGH (ref 65–99)

## 2016-12-20 LAB — T4, FREE: FREE T4: 1.17 ng/dL — AB (ref 0.61–1.12)

## 2016-12-20 LAB — PREALBUMIN: Prealbumin: 9.5 mg/dL — ABNORMAL LOW (ref 18–38)

## 2016-12-20 LAB — SURGICAL PATHOLOGY

## 2016-12-20 LAB — BRAIN NATRIURETIC PEPTIDE: B NATRIURETIC PEPTIDE 5: 167.4 pg/mL — AB (ref 0.0–100.0)

## 2016-12-20 LAB — PHOSPHORUS: Phosphorus: 2.8 mg/dL (ref 2.5–4.6)

## 2016-12-20 LAB — ECHOCARDIOGRAM COMPLETE

## 2016-12-20 LAB — AMMONIA: Ammonia: 18 umol/L (ref 9–35)

## 2016-12-20 LAB — TSH: TSH: 6.45 u[IU]/mL — AB (ref 0.350–4.500)

## 2016-12-20 MED ORDER — POTASSIUM CHLORIDE CRYS ER 20 MEQ PO TBCR
40.0000 meq | EXTENDED_RELEASE_TABLET | Freq: Two times a day (BID) | ORAL | Status: AC
  Administered 2016-12-20 – 2016-12-21 (×4): 40 meq via ORAL
  Filled 2016-12-20 (×5): qty 2

## 2016-12-20 MED ORDER — RESOURCE THICKENUP CLEAR PO POWD
Freq: Once | ORAL | Status: AC
  Administered 2016-12-20: 18:00:00 via ORAL
  Filled 2016-12-20: qty 125

## 2016-12-20 MED ORDER — HYDRALAZINE HCL 20 MG/ML IJ SOLN: 10.0000 mg | Freq: Four times a day (QID) | INTRAMUSCULAR | Status: DC | PRN

## 2016-12-20 MED ORDER — ORAL CARE MOUTH RINSE
15.0000 mL | Freq: Two times a day (BID) | OROMUCOSAL | Status: DC
  Administered 2016-12-21 – 2016-12-28 (×12): 15 mL via OROMUCOSAL

## 2016-12-20 MED ORDER — HYDROCODONE-ACETAMINOPHEN 7.5-325 MG PO TABS
1.0000 | ORAL_TABLET | Freq: Four times a day (QID) | ORAL | Status: DC | PRN
  Administered 2016-12-20: 1 via ORAL
  Filled 2016-12-20: qty 1

## 2016-12-20 NOTE — Consult Note (Signed)
Orthopaedic Trauma Service (OTS) Consult/H&P  Reason for Consult: R supracondylar distal humerus fracture with acute radial nerve injury  Referring Physician: Wynetta Emery, MD (ortho, [facility])   HPI: Taylor Brown is an 66 y.o. RHD white female who was originally admitted to the hospital on 12/17/2016. It is reported that the patient had a fall while lifting a bag of dog food. She landed on her right arm. Patient was brought to [facility] for evaluation. In the emergency department there were concerns for melena. Workup in the ED was notable for a right supracondylar humerus fracture. Patient was immobilized with a foam strap immobilizer. She was admitted to the medical service with orthopedic and GI consults. Patient did have an upper endoscopy at [facility] and was found to have antral gastritis. No acute bleed was noted. Patient was continued on PPI. Follow-up orthopedic examination is notable for acute radial nerve injury. Due to this it was felt that the patient needed to be transferred to a high level of care and was transferred to the orthopedic trauma service at [facility].  During evaluation at [facility] it seems that the patient has not followed up with physician in more than 15 years for primary care issues. She was started on some antihypertensives at [place], including Norvasc and metoprolol.  There is reported that the patient drinks at least 6 beers a day and smokes 2 packs cigarettes a day. She was also taking Aleve on a daily basis prior to admission. Patient reported to me today that she drinks vodka daily but was unable to quantify how much. She does have a 12 yorkshire terriers at home. She lives by herself. Prior to admission it is reported that patient is able to perform ADLs.  Is reported that on presentation to [place] patient presented in a quite disheveled state, very dirty and smelling of urine. On evaluation today at [facility] she is still  quite dirty and there is a strong stench of urine. Nursing also reports beginning of skin breakdown in the perineal area.   My evaluation this morning patient is quite confused. She was given Ativan at 0400 this morning which has calmed her down. Patient does not know where she is, why she is here. She does not know the date or year.   On chart review it is unclear if the CIWA protocol was used while she was at [facility]. I did start this protocol on her transfer yesterday. Patient did arrive to [facility] late yesterday afternoon.  Past Medical History:  Diagnosis Date  . Arthritis    osteo  . GERD (gastroesophageal reflux disease)   . Humerus fracture 12/19/2016   right    Past Surgical History:  Procedure Laterality Date  . ANKLE FRACTURE SURGERY  05/2012  . APPENDECTOMY    . ESOPHAGOGASTRODUODENOSCOPY N/A 12/18/2016   Procedure: ESOPHAGOGASTRODUODENOSCOPY (EGD);  Surgeon: Wilford Corner, MD;  Location: [facility] ENDOSCOPY;  Service: Endoscopy;  Laterality: N/A;  . ORIF ANKLE FRACTURE  05/12/2012   Procedure: OPEN REDUCTION INTERNAL FIXATION (ORIF) ANKLE FRACTURE;  Surgeon: Wylene Simmer, MD;  Location: [place];  Service: Orthopedics;  Laterality: Left;  . ORIF WRIST FRACTURE  05/12/2012   Procedure: OPEN REDUCTION INTERNAL FIXATION (ORIF) WRIST FRACTURE;  Surgeon: Mcarthur Rossetti, MD;  Location: [place];  Service: Orthopedics;  Laterality: Left;  . TUBAL LIGATION    . WRIST FRACTURE SURGERY  05/2012    Family History  Problem Relation Age of Onset  . Hypertension Mother  Social History:  reports that she has been smoking Cigarettes.  She has a 30.00 pack-year smoking history. She has never used smokeless tobacco. She reports that she drinks alcohol. She reports that she does not use drugs.     Allergies: No Known Allergies  Medications:  I have reviewed the patient's current medications. Prior to Admission:  Prescriptions Prior to Admission  Medication Sig Dispense Refill Last  Dose  . amLODipine (NORVASC) 5 MG tablet Take 1 tablet (5 mg total) by mouth daily. 30 tablet 0   . docusate sodium 100 MG CAPS Take 100 mg by mouth 2 (two) times daily. (Patient not taking: Reported on 12/17/2016) 30 capsule 0 Completed Course at Unknown time  . esomeprazole (NEXIUM) 20 MG capsule Take 20 mg by mouth as needed.   prn at prn  . loratadine (CLARITIN) 10 MG tablet Take 10 mg by mouth daily as needed. For allergies   prn at prn  . metoprolol tartrate (LOPRESSOR) 25 MG tablet Take 1 tablet (25 mg total) by mouth 2 (two) times daily. 60 tablet 0   . Multiple Vitamin (MULTIVITAMIN WITH MINERALS) TABS Take 1 tablet by mouth daily. 30 tablet 0   . oxyCODONE (OXY IR/ROXICODONE) 5 MG immediate release tablet Take 1-2 tablets (5-10 mg total) by mouth every 3 (three) hours as needed. (Patient not taking: Reported on 12/17/2016) 40 tablet 0 Completed Course at Unknown time  . pantoprazole (PROTONIX) 40 MG tablet Take 1 tablet (40 mg total) by mouth 2 (two) times daily before a meal. 60 tablet 1   . senna (SENOKOT) 8.6 MG TABS Take 1 tablet (8.6 mg total) by mouth 2 (two) times daily. (Patient not taking: Reported on 12/17/2016) 60 tablet 0 Completed Course at Unknown time  . [DISCONTINUED] naproxen sodium (ANAPROX) 220 MG tablet Take 220 mg by mouth 2 (two) times daily with a meal.   prn at prn    Results for orders placed or performed during the hospital encounter of 12/19/16 (from the past 48 hour(s))  Type and screen [place]     Status: None   Collection Time: 12/19/16  4:32 PM  Result Value Ref Range   ABO/RH(D) O POS    Antibody Screen NEG    Sample Expiration 12/22/2016   ABO/Rh     Status: None   Collection Time: 12/19/16  4:32 PM  Result Value Ref Range   ABO/RH(D) O POS   MRSA PCR Screening     Status: None   Collection Time: 12/19/16  5:29 PM  Result Value Ref Range   MRSA by PCR NEGATIVE NEGATIVE    Comment:        The GeneXpert MRSA Assay (FDA approved for  NASAL specimens only), is one component of a comprehensive MRSA colonization surveillance program. It is not intended to diagnose MRSA infection nor to guide or monitor treatment for MRSA infections.   CBC     Status: Abnormal   Collection Time: 12/20/16  4:58 AM  Result Value Ref Range   WBC 7.9 4.0 - 10.5 K/uL   RBC 3.94 3.87 - 5.11 MIL/uL   Hemoglobin 13.6 12.0 - 15.0 g/dL   HCT 42.2 36.0 - 46.0 %   MCV 107.1 (H) 78.0 - 100.0 fL   MCH 34.5 (H) 26.0 - 34.0 pg   MCHC 32.2 30.0 - 36.0 g/dL   RDW 14.9 11.5 - 15.5 %   Platelets 168 150 - 400 K/uL  Comprehensive metabolic panel  Status: Abnormal   Collection Time: 12/20/16  4:58 AM  Result Value Ref Range   Sodium 135 135 - 145 mmol/L   Potassium 3.3 (L) 3.5 - 5.1 mmol/L   Chloride 98 (L) 101 - 111 mmol/L   CO2 24 22 - 32 mmol/L   Glucose, Bld 109 (H) 65 - 99 mg/dL   BUN 6 6 - 20 mg/dL   Creatinine, Ser 0.51 0.44 - 1.00 mg/dL   Calcium 8.1 (L) 8.9 - 10.3 mg/dL   Total Protein 6.0 (L) 6.5 - 8.1 g/dL   Albumin 2.7 (L) 3.5 - 5.0 g/dL   AST 40 15 - 41 U/L   ALT 36 14 - 54 U/L   Alkaline Phosphatase 116 38 - 126 U/L   Total Bilirubin 1.4 (H) 0.3 - 1.2 mg/dL   GFR calc non Af Amer >60 >60 mL/min   GFR calc Af Amer >60 >60 mL/min    Comment: (NOTE) The eGFR has been calculated using the CKD EPI equation. This calculation has not been validated in all clinical situations. eGFR's persistently <60 mL/min signify possible Chronic Kidney Disease.    Anion gap 13 5 - 15   Results for [place], Taylor Brown (MRN [id number]) as of 12/20/2016 10:15  Ref. Range 12/18/2016 09:17  Bacteria, UA Latest Ref Range: NONE SEEN  RARE (A)  Bilirubin Urine Latest Ref Range: NEGATIVE  NEGATIVE  Color, Urine Latest Ref Range: YELLOW  STRAW (A)  Glucose Latest Ref Range: NEGATIVE mg/dL NEGATIVE  Hgb urine dipstick Latest Ref Range: NEGATIVE  MODERATE (A)  Ketones, ur Latest Ref Range: NEGATIVE mg/dL 20 (A)  Leukocytes, UA Latest Ref Range: NEGATIVE   NEGATIVE  Mucous Unknown PRESENT  Nitrite Latest Ref Range: NEGATIVE  NEGATIVE  pH Latest Ref Range: 5.0 - 8.0  8.0  Protein Latest Ref Range: NEGATIVE mg/dL NEGATIVE  RBC / HPF Latest Ref Range: 0 - 5 RBC/hpf 0-5  Specific Gravity, Urine Latest Ref Range: 1.005 - 1.030  1.008  Squamous Epithelial / LPF Latest Ref Range: NONE SEEN  0-5 (A)  WBC, UA Latest Ref Range: 0 - 5 WBC/hpf 0-5   Results for Brown, Taylor Brown (MRN [id number]) as of 12/20/2016 10:15  Ref. Range 12/17/2016 11:53  Hemoglobin A1C Latest Ref Range: 4.8 - 5.6 % 5.7 (H)    Review of Systems  Unable to perform ROS: Mental status change   Blood pressure (!) 160/90, pulse 100, temperature 98.5 F (36.9 C), temperature source Oral, resp. rate 18, SpO2 93 %. Physical Exam  Constitutional: She appears lethargic. She appears ill.  Older appearing white female. Lying in bed. Not verbalizing much. Confused. Strong odor of urine Unkempt appearance  Cardiovascular: Regular rhythm, S1 normal and S2 normal.  Tachycardia present.   Pulmonary/Chest: No respiratory distress.  Patient does appear to have a slight increase work of breathing Anterior fields are clear Diminished sounds at the bases  Abdominal:  Distended, nontender Umbilicus prominent  Veins visible   Musculoskeletal:  Right Upper Extremity  Inspection:   Soft immobilization device to R UEx        Cuff around distal upper arm and distal forearm         Band around chest     + swelling and ecchymosis to R elbow    + deformity     No deformity to forearm, wrist or hand  Bony eval:    TTP R distal humerus    R shoulder and clavicle w/o tenderness  R forearm, wrist and hand nontender        Gross instability distal upper arm     No instability to forearm, wrist and hand  Soft tissue:    Swelling and ecchymosis to R elbow    Skin is friable     No open wounds or lesions        Skin irritation to R flank from immobilizer   ROM:    Passively can fully range  forearm, wrist and hand  Sensation:    Pt report intact sensation along radial, ulnar and median nerves. Axillary nerve sensation is intact   Motor:    Finger flexion and wrist flexion intact      No wrist extension noted    + interosseous function     + DIP flexion intact    EPL appears to be intact as well  Vascular:    Brisk cap refill    + radial pulse    Compartments are soft   Left upper extremity      Ecchymosis to L hand noted     Moderate swelling    No gross deformities     R/U/M motor and sensory functions grossly intact    + Radial pulse     Brisk cap refill     No gross motion with manipulation of L upper extremity   B Lower Extremities   Dressings to B lower legs removed No open wounds  Small areas of maceration noted to distal anterior lower leg Soft tissue changes associated with PVD noted Dystrophic and long toe nails  Motor and sensory functions grossly intact Mild swelling B LEx  Moving lower extremities without difficulty   Neurological: She appears lethargic.     Assessment/Plan:  66 year old right-hand-dominant female with right supracondylar distal humerus fracture following fall  - Fall  -Right supracondylar distal humerus fracture, closed. Acute radial nerve palsy  Patient will need surgical intervention to address her fracture as well as to explore her nerve givenchange in exam while at [place]  Her current medical condition will not allow [place] to proceed to the OR today. We are hopeful to take the patient to the OR on Friday   Patient was placed into a well-padded posterior long-arm splint with side struts by myself and our [facility] at the bedside. Patient tolerated this well. We did place an ABD pad in her axilla as well to help absorb moisture and hopefully limit skin breakdown.   Patient will be nonweightbearing on her right upper extremity for 8 weeks following surgery.  Concerned with patient's ability to maintain compliance  given her medical history including daily alcohol use and 2 pack-a-day history of smoking  - Altered mental status/acute alcoholic withdrawal/ acute encephalopathy   CIWA protocol started yesterday on transfer to [facility]  Last drink was day of admission to [place] (6/9)  Ammonia level pending  Patient is on IV fluids as well    Internal medicine will consult and follow along. Greatly appreciate their help   Patient was to be sent to a stepdown unit however there are no stepdown beds available. As such patient will be transferred to ICU. She is unstable at this time to remain on the medical surgical floor  - Medical issues   Hypertension   Oral medications currently on hold due to mental status changes   Hydralazine IV every 6 hours as needed   Restart oral medications once mentation improves   ? Ascites/ ? Liver disease  CT scan at [facility] notable for enhancing lesions in the liver near the dome as well as the anterior segment of the right lobe.   Recommended nonemergent pre-and serial postcontrast CT or MRI for further evaluation   Check hepatic panel along with coags   Cardiovascular    Left ventricular hypertrophy noted on CT scan at [place] as well   We will check echo to evaluate for CHF given history as well as clinical findings, this was discussed with the medical service   Check BNP     Pt appears to have PVD as well    Chronic alcohol use   As above   Withdrawal protocol   Vitamin and mineral supplementation along with IV fluids   Perineal breakdown   Foley    Continue Foley until patient is mentating better and can consistently ask for help to assist with toileting   Nicotine dependence   Absolutely no nicotine products of any kind   Nicotine patch dc'd   Directly inhibits bone and wound healing    Social issues   SW consult for wellness eval, ? eval by adult protective services given condition that pt arrived in at [facility] (disheveled, dirty, smelling of  urine)    Pt reportedly lives alone with 12 small dogs     - Pain management:  Minimize narcotics  No NSAIDs  - ABL anemia/Hemodynamics  Stable   - DVT/PE prophylaxis:  lovenox per pharmacy consult    - Metabolic Bone Disease:  Check vitamin D levels  Suspect osteoporosis given history  - FEN/GI prophylaxis/Foley/Lines:  protonix   Full liquids  Foley  Pt being bathed by nursing staff before transfer to ICU   Hypokalemia    Supplement   Serum magnesium   Nutrition eval    Check prealbumin      - Dispo:  Transfer to Unit  Appreciate medicines involvement   No surgery to day for R arm   Continue to monitor  Hopeful for OR Friday    Jari Pigg, PA-C [facility] [phone] (P) 12/20/2016, 9:04 AM

## 2016-12-20 NOTE — Progress Notes (Signed)
Orthopedic Tech Progress Note Patient Details:  Taylor Brown 1951/06/01 [id number]  Ortho Devices Type of Ortho Device: Ace wrap, Long arm splint, Sling immobilizer Ortho Device/Splint Interventions: Application   [name] 12/20/2016, 9:45 AM

## 2016-12-20 NOTE — Evaluation (Signed)
Clinical/Bedside Swallow Evaluation Patient Details  Name: Taylor Brown MRN: [id number] Date of Birth: 1950-12-10  Today's Date: 12/20/2016 Time: SLP Start Time (ACUTE ONLY): 3818 SLP Stop Time (ACUTE ONLY): 1500 SLP Time Calculation (min) (ACUTE ONLY): 18 min  Past Medical History:  Past Medical History:  Diagnosis Date  . 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 Arthritis    osteo  . GERD (gastroesophageal reflux disease)   . Humerus fracture 12/19/2016   right  . Liver lesion 12/20/2016  . Nicotine dependence 12/20/2016   Past Surgical History:  Past Surgical History:  Procedure Laterality Date  . ANKLE FRACTURE SURGERY  05/2012  . APPENDECTOMY    . ESOPHAGOGASTRODUODENOSCOPY N/A 12/18/2016   Procedure: ESOPHAGOGASTRODUODENOSCOPY (EGD);  Surgeon: Wilford Corner, MD;  Location: [facility] ENDOSCOPY;  Service: Endoscopy;  Laterality: N/A;  . ORIF ANKLE FRACTURE  05/12/2012   Procedure: OPEN REDUCTION INTERNAL FIXATION (ORIF) ANKLE FRACTURE;  Surgeon: Wylene Simmer, MD;  Location: [place];  Service: Orthopedics;  Laterality: Left;  . ORIF WRIST FRACTURE  05/12/2012   Procedure: OPEN REDUCTION INTERNAL FIXATION (ORIF) WRIST FRACTURE;  Surgeon: Mcarthur Rossetti, MD;  Location: [place];  Service: Orthopedics;  Laterality: Left;  . TUBAL LIGATION    . WRIST FRACTURE SURGERY  05/2012   HPI:  66 y.o.femalewho has not been seen by a PCP in greater than 15 years, admitted initially to [facility] after sustaining a R humeral fracture, transferred to [facility] due to complications with radial nerve injury requiring complex surgery planned for 6/14.  While being admitted she reported GIB, in the setting of heavy NSAID use and ETOH (6 beers a day and Vodka). GI consulted, underwent EGD - remarkable for antral gastritis. Dx acute encephalopathy in setting of alcohol withdrawal.  On clear liquids due to GIB.    Assessment / Plan / Recommendation Clinical Impression  Pt presents  with dysphagia with coughing associated with 100% of thin liquid swallows, eliminated with consumption of nectars.  Pt is confused, pleasant.  No focal deficits; RR occasionally reaching low 30s during assessment, but pt appears to be coordinating breathing/swallowing.  States that she has "strangled" with liquids for quite a while, but unable to give timeline.  Pt is on clears due to GIB.  For now, recommend thickening all liquids to nectar - anticipate improved swallow as MS clears to baseline.  SLP will follow for safety; D/W RN, pt, daughter.  SLP Visit Diagnosis: Dysphagia, unspecified (R13.10)    Aspiration Risk  Mild aspiration risk    Diet Recommendation   liquids thickened to nectar (no solid foods per MD -due to GIB)  Medication Administration: Whole meds with liquid    Other  Recommendations Oral Care Recommendations: Oral care BID Other Recommendations: Order thickener from pharmacy   Follow up Recommendations None      Frequency and Duration min 2x/week  1 week       Prognosis        Swallow Study   General HPI: 66 y.o.femalewho has not been seen by a PCP in greater than 15 years, admitted initially to [facility] after sustaining a R humeral fracture, transferred to [facility] due to complications with radial nerve injury requiring complex surgery planned for 6/14.  While being admitted she reported GIB, in the setting of heavy NSAID use and ETOH (6 beers a day and Vodka). GI consulted, underwent EGD - remarkable for antral gastritis. Dx acute encephalopathy in setting of alcohol withdrawal.  On clear  liquids due to GIB.  Type of Study: Bedside Swallow Evaluation Previous Swallow Assessment: no Diet Prior to this Study: Other (Comment) (clear liquids) Temperature Spikes Noted: No Respiratory Status: Room air History of Recent Intubation: No Behavior/Cognition: Alert;Confused Oral Cavity Assessment: Within Functional Limits Oral Cavity - Dentition: Dentures, top Vision:  Functional for self-feeding Patient Positioning: Upright in bed Baseline Vocal Quality: Normal Volitional Cough: Strong Volitional Swallow: Able to elicit    Oral/Motor/Sensory Function Overall Oral Motor/Sensory Function: Within functional limits   Ice Chips Ice chips: Not tested   Thin Liquid Thin Liquid: Impaired Presentation: Cup;Spoon Pharyngeal  Phase Impairments: Cough - Immediate    Nectar Thick Nectar Thick Liquid: Within functional limits Presentation: Cup;Straw   Honey Thick Honey Thick Liquid: Not tested   Puree Puree: Not tested   Solid   GO   Solid: Not tested        Juan Quam Laurice 12/20/2016,3:08 PM

## 2016-12-20 NOTE — Social Work (Addendum)
CSW received concern form clinical team about patients pets.  CSW contacted friend who would also follow up.  CSW then contacted the daughter Anderson [place], who indicated she will be caring for dogs in patient's absence.  CSW has been advised by clinical staff that patient is being transferred to another floor for continued care.

## 2016-12-20 NOTE — Progress Notes (Signed)
Patient to be transferred to ICU [id number]. Report called and given to [facility]. Patient to be transferred by the [facility] nurses

## 2016-12-20 NOTE — Progress Notes (Signed)
Patient is confused, oriented only to person, disoriented to place, time and situation. Telemetry monitoring initiated. Nurse will continue to monitor

## 2016-12-20 NOTE — Consult Note (Signed)
Medical Consultation   Taylor Brown  DUK:[id number]  DOB: Jan 01, 1951  DOA: 12/19/2016  PCP: Patient, [name]    Requesting physician: Orthopedics team Reason for consultation: To help in the management of her primary care issues     History of Present Illness: Taylor Brown is an 66 y.o. female who has not been seen by a PCP in greater than 15 years, admitted initially to [facility] after sustaining a R humeral fracture, spontaneously as she was lifting an object, hearing a "pop". 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 Her pain is better controlled. Denies fevers, chills, night sweats, vision changes, or mucositis. Denies any respiratory complaints. Denies any chest pain or palpitations. Reports chronic lower extremity swelling. Denies nausea, heartburn . Denies abdominal pain. Appetite is normal. Denies any dysuria. Denies abnormal skin rashes, or neuropathy. Denies any bleeding issues such as epistaxis, hematemesis, hematuria  While being admitted she reported GIB, on the setting of heavy NSAID use and  ETOH (6 beers a day and Vodka). GI consulted, underwent EGD  remarkable for antral Gastritis. Place on Protonix IV and clear liquids.  Other medical issues include HTN, tobacco abuse 2 ppd, and as mentioned above ETOH abuse. She is lethargic, mildly confused.  We were kindly requested to see the patient in consultation to help in the management of her primary care needs.   Review of Systems:  As per HPI otherwise 10 point review of systems negative.     Past Medical History: Past Medical History:  Diagnosis Date  . Arthritis    osteo  . GERD (gastroesophageal reflux disease)   . Humerus fracture 12/19/2016   right    Past Surgical History: Past Surgical History:  Procedure Laterality Date  . ANKLE FRACTURE SURGERY  05/2012  . APPENDECTOMY     . ESOPHAGOGASTRODUODENOSCOPY N/A 12/18/2016   Procedure: ESOPHAGOGASTRODUODENOSCOPY (EGD);  Surgeon: Wilford Corner, MD;  Location: [facility] ENDOSCOPY;  Service: Endoscopy;  Laterality: N/A;  . ORIF ANKLE FRACTURE  05/12/2012   Procedure: OPEN REDUCTION INTERNAL FIXATION (ORIF) ANKLE FRACTURE;  Surgeon: Wylene Simmer, MD;  Location: [place];  Service: Orthopedics;  Laterality: Left;  . ORIF WRIST FRACTURE  05/12/2012   Procedure: OPEN REDUCTION INTERNAL FIXATION (ORIF) WRIST FRACTURE;  Surgeon: Mcarthur Rossetti, MD;  Location: [place];  Service: Orthopedics;  Laterality: Left;  . TUBAL LIGATION    . WRIST FRACTURE SURGERY  05/2012     Allergies:  No Known Allergies   Social History: Social History   Social History  . Marital status: Married    Spouse name: N/A  . Number of children: N/A  . Years of education: N/A   Occupational History  . Not on file.   Social History Main Topics  . Smoking status: Current Every Day Smoker    Packs/day: 1.00    Years: 30.00    Types: Cigarettes  . Smokeless tobacco: Never Used     Comment: smoking cessation material refused   . Alcohol use Yes     Comment: 6 beers per day  . Drug use: No  . Sexual activity: Not on file   Other Topics Concern  . Not on file   Social History Narrative  . No narrative on file       Family History: Family History  Problem Relation Age of Onset  . Hypertension Mother     Family history reviewed and not pertinent    Physical Exam: Vitals:   12/19/16 1539 12/19/16 2252 12/20/16 0530  BP: (!) 163/86 140/66 (!) 160/90  Pulse: (!) 107 (!) 102 100  Resp: 18 18 18   Temp: 98.5 F (36.9 C)  98.5 F (36.9 C)  TempSrc: Oral  Oral  SpO2: 91% 92% 93%    Constitutional: Appears calm, lethargic   Eyes: PERLA, EOMI, irises appear normal, anicteric sclera,  ENMT: external ears  normal, normal hearing   Lips appears normal, oropharynx mucosa, tongue,  appear normal  Neck: neck appears normal, no masses,  normal ROM, no thyromegaly, no JVD  CVS: S1-S2 clear, no murmur rubs or gallops, no LE edema, normal pedal pulses  Respiratory:   no wheezing, rales , bilateral rhonchi noted  Respiratory effort normal. No accessory muscle use.  Abdomen:  morbidly obesesoft nontender, nondistended, normal bowel sounds, no hepatosplenomegaly, no hernias  Musculoskeletal: no cyanosis, clubbing, 2 + edema noted bilaterally. Right arm with a sling, immoblilized  Neuro: Cranial nerves II-XII intact, strength, sensation, reflexes Psych: stable mood and affect, confused, she states is in a doctors office  Skin: no rashes or lesions or ulcers, no induration or nodules   Data reviewed:  I have personally reviewed following labs and imaging studies Labs:  CBC:  Recent Labs Lab 12/17/16 1153  12/17/16 2321 12/18/16 0727 12/18/16 1520 12/19/16 0428 12/20/16 0458  WBC 7.2  --   --  6.8  --  8.2 7.9  NEUTROABS 5.2  --   --   --   --   --   --   HGB 13.4  < > 13.4 13.3 14.0 14.0 13.6  HCT 38.8  --   --  38.8  --  40.4 42.2  MCV 104.5*  --   --  105.3*  --  104.7* 107.1*  PLT 215  --   --  181  --  171 168  < > = values in this interval not displayed.  Basic Metabolic Panel:  Recent Labs Lab 12/17/16 1153 12/18/16 0727  12/18/16 1520 12/19/16 0428 12/20/16 0458  NA 143 138  --   --  136 135  K 2.6* 2.8*  < >  --  3.5 3.3*  CL 99* 94*  --   --  98* 98*  CO2 26 33*  --   --  27 24  GLUCOSE 121* 103*  --   --  110* 109*  BUN 8 <5*  --   --  7 6  CREATININE 0.54 <0.30*  --   --  0.46 0.51  CALCIUM 8.5* 8.0*  --   --  8.4* 8.1*  MG 1.5*  --   --  1.5* 1.5*  --   < > = values in this interval not displayed. GFR Estimated Creatinine Clearance: 68.5 mL/min (by C-G formula based on SCr of 0.51 mg/dL). Liver Function Tests:  Recent Labs Lab 12/17/16 1153 12/20/16 0458  AST 89* 40  ALT 53 36  ALKPHOS 134* 116  BILITOT 1.1 1.4*  PROT 7.0 6.0*  ALBUMIN 3.5 2.7*    Recent Labs Lab 12/17/16 1139    LIPASE 21   No results for input(s): AMMONIA in the last 168 hours. Coagulation profile  Recent Labs Lab 12/17/16 1153  INR 1.03    Cardiac Enzymes: No results for input(s): CKTOTAL, CKMB, CKMBINDEX, TROPONINI in the last 168 hours.  BNP: Invalid input(s): POCBNP CBG: No results for input(s): GLUCAP in the last 168 hours. D-Dimer No results for input(s): DDIMER in the last 72 hours. Hgb A1c  Recent Labs  12/17/16 1153  HGBA1C 5.7*   Lipid Profile  Recent Labs  12/17/16 1153  CHOL 186  HDL 94  LDLCALC 79  TRIG 64  CHOLHDL 2.0   Thyroid function studies No results for input(s): TSH, T4TOTAL, T3FREE, THYROIDAB in the last 72 hours.  Invalid input(s): FREET3 Anemia work up No results for input(s): VITAMINB12, FOLATE, FERRITIN, TIBC, IRON, RETICCTPCT in the last 72 hours. Urinalysis    Component Value Date/Time   COLORURINE STRAW (A) 12/18/2016 0917   APPEARANCEUR CLEAR (A) 12/18/2016 0917   LABSPEC 1.008 12/18/2016 0917   PHURINE 8.0 12/18/2016 0917   GLUCOSEU NEGATIVE 12/18/2016 0917   HGBUR MODERATE (A) 12/18/2016 0917   BILIRUBINUR NEGATIVE 12/18/2016 0917   KETONESUR 20 (A) 12/18/2016 0917   PROTEINUR NEGATIVE 12/18/2016 0917   UROBILINOGEN 0.2 05/13/2012 1440   NITRITE NEGATIVE 12/18/2016 0917   LEUKOCYTESUR NEGATIVE 12/18/2016 0917     Sepsis Labs Invalid input(s): PROCALCITONIN,  WBC,  LACTICIDVEN Microbiology Recent Results (from the past 240 hour(s))  MRSA PCR Screening     Status: None   Collection Time: 12/19/16  5:29 PM  Result Value Ref Range Status   MRSA by PCR NEGATIVE NEGATIVE Final    Comment:        The GeneXpert MRSA Assay (FDA approved for NASAL specimens only), is one component of a comprehensive MRSA colonization surveillance program. It is not intended to diagnose MRSA infection nor to guide or monitor treatment for MRSA infections.        Inpatient Medications:   Scheduled Meds: . chlorhexidine  60 mL Topical  Once  . docusate sodium  100 mg Oral BID  . folic acid  1 mg Oral Daily  . LORazepam  0-4 mg Oral Q6H   Followed by  . [START ON 12/21/2016] LORazepam  0-4 mg Oral Q12H  . multivitamin with minerals  1 tablet Oral Daily  . pantoprazole  40 mg Oral BID AC  . povidone-iodine  2 application Topical Once  . thiamine  100 mg Oral Daily   Or  . thiamine  100 mg Intravenous Daily   Continuous Infusions: . 0.9 % NaCl with KCl 20 mEq / L 100 mL/hr at 12/20/16 0855  .  ceFAZolin (ANCEF) IV    . methocarbamol (ROBAXIN)  IV       Radiological Exams on Admission: No results found.  Impression/Recommendations Active Problems:   Alcohol dependence (HCC)   GI bleed   Right supracondylar humerus fracture   Acute encephalopathy   Alcohol abuse   Essential hypertension   Hypokalemia   Right distal fracture of the humerus, after fall in the setting of possible osteoporosis Patient is immobilized with sling For surgery on Thursday 6/14 Continue pain management  And OT/PT as as per Ortho  Acute encephalopathy in the setting of Alcohol withdrawal symptoms . Last drink 3 days ago .Afebrile CT of the head without acute abnormality.  UA negative as of 6/10 . WBC normal   Bicarb 24 .T bil 1.4  . :ipase normal . Placed on CIWA protocol on arrival to [facility]. Ammonia, B12 (MCV 107) pending  Of note, a recent CT angio of the chest at [facility] revealed a incidental enhancing focus in the anterior segment of the right lobe of the liver 1.7 x 1.7 cm.and  another near the dome of the liver in the medial segment left lobe measuring 1.5 x 1.3 cm. No other focal liver lesions are apparent  Continue CIWA protocol is an Monitor for withdrawal symptoms  Patient may need CT/ MRI liver in the near future  to further r/o malignancy   Hypertension BP 160/90  Pulse 100  , has not followed her BP for at least 15 years  Continue home anti-hypertensive medications started in  with Norvasc and Metoprolol Continue  Hydralazine Q6 hours as needed for BP 160/90  2 D echo pending, EKG without ACS, on SR  Will need Care management to establish PCP f/u as OP    History of melena at high risk for GIB due to ETOH abuse, and NSAIDs regularly as outpatient. GI evaluated patient at [facility]  S/p  EGD remarkable for antral gastritis. Current Hb  13.6  Continue IV Protonix Avoid NSAIDs Recommend to follow up with GI or PCP  As OP   Hypokalemia, current K 3.3  Oral replenishment Check Mg Repeat CMET in am   Tobacco abuse with nicotine withdrawal, smokes 2 ppd  Nicotine patch vs gum  Counseled cessation    Poor nutritional status despite obesity  in the setting of ETOH abuse .  Consider Nutrition evaluation as Albumin 2.6     Thank you for this consultation.  Our [facility] hospitalist team will follow the patient with you.      [facility] E PA-C [facility] 12/20/2016, 10:47 AM

## 2016-12-20 NOTE — Progress Notes (Signed)
  Echocardiogram 2D Echocardiogram has been performed.  Taylor Brown 12/20/2016, 1:52 PM

## 2016-12-21 DIAGNOSIS — F10231 Alcohol dependence with withdrawal delirium: Secondary | ICD-10-CM

## 2016-12-21 DIAGNOSIS — K2921 Alcoholic gastritis with bleeding: Secondary | ICD-10-CM

## 2016-12-21 LAB — CBC WITH DIFFERENTIAL/PLATELET
Basophils Absolute: 0 10*3/uL (ref 0.0–0.1)
Basophils Relative: 0 %
EOS ABS: 0.2 10*3/uL (ref 0.0–0.7)
Eosinophils Relative: 3 %
HEMATOCRIT: 42.5 % (ref 36.0–46.0)
HEMOGLOBIN: 13.8 g/dL (ref 12.0–15.0)
LYMPHS ABS: 1.4 10*3/uL (ref 0.7–4.0)
LYMPHS PCT: 18 %
MCH: 34.7 pg — AB (ref 26.0–34.0)
MCHC: 32.5 g/dL (ref 30.0–36.0)
MCV: 106.8 fL — AB (ref 78.0–100.0)
Monocytes Absolute: 0.9 10*3/uL (ref 0.1–1.0)
Monocytes Relative: 12 %
NEUTROS ABS: 5.1 10*3/uL (ref 1.7–7.7)
NEUTROS PCT: 67 %
Platelets: 194 10*3/uL (ref 150–400)
RBC: 3.98 MIL/uL (ref 3.87–5.11)
RDW: 15 % (ref 11.5–15.5)
WBC: 7.6 10*3/uL (ref 4.0–10.5)

## 2016-12-21 LAB — BASIC METABOLIC PANEL
Anion gap: 11 (ref 5–15)
BUN: 13 mg/dL (ref 6–20)
CHLORIDE: 104 mmol/L (ref 101–111)
CO2: 24 mmol/L (ref 22–32)
Calcium: 8.5 mg/dL — ABNORMAL LOW (ref 8.9–10.3)
Creatinine, Ser: 0.64 mg/dL (ref 0.44–1.00)
GFR calc non Af Amer: >60 mL/min (ref >60)
Glucose, Bld: 124 mg/dL — ABNORMAL HIGH (ref 65–99)
POTASSIUM: 4 mmol/L (ref 3.5–5.1)
SODIUM: 139 mmol/L (ref 135–145)

## 2016-12-21 LAB — VITAMIN D 25 HYDROXY (VIT D DEFICIENCY, FRACTURES): Vit D, 25-Hydroxy: 15.5 ng/mL — ABNORMAL LOW (ref 30.0–100.0)

## 2016-12-21 LAB — HEPATIC FUNCTION PANEL
ALBUMIN: 2.8 g/dL — AB (ref 3.5–5.0)
ALK PHOS: 105 U/L (ref 38–126)
ALT: 34 U/L (ref 14–54)
AST: 39 U/L (ref 15–41)
Bilirubin, Direct: 0.3 mg/dL (ref 0.1–0.5)
Indirect Bilirubin: 0.7 mg/dL (ref 0.3–0.9)
TOTAL PROTEIN: 6.2 g/dL — AB (ref 6.5–8.1)
Total Bilirubin: 1 mg/dL (ref 0.3–1.2)

## 2016-12-21 LAB — URINE CULTURE

## 2016-12-21 LAB — PROTIME-INR
INR: 1
PROTHROMBIN TIME: 13.2 s (ref 11.4–15.2)

## 2016-12-21 LAB — T3, FREE: T3, Free: 3.5 pg/mL (ref 2.0–4.4)

## 2016-12-21 LAB — CALCITRIOL (1,25 DI-OH VIT D): VIT D 1 25 DIHYDROXY: 124 pg/mL — AB (ref 19.9–79.3)

## 2016-12-21 LAB — GLUCOSE, CAPILLARY: GLUCOSE-CAPILLARY: 138 mg/dL — AB (ref 65–99)

## 2016-12-21 LAB — CALCIUM, IONIZED: CALCIUM, IONIZED, SERUM: 4.5 mg/dL (ref 4.5–5.6)

## 2016-12-21 LAB — FOLATE RBC
FOLATE, HEMOLYSATE: 263.7 ng/mL
Folate, RBC: 680 ng/mL (ref >498)
HEMATOCRIT: 38.8 % (ref 34.0–46.6)

## 2016-12-21 LAB — VITAMIN B1: VITAMIN B1 (THIAMINE): 224.8 nmol/L — AB (ref 66.5–200.0)

## 2016-12-21 LAB — APTT: aPTT: 30 seconds (ref 24–36)

## 2016-12-21 MED ORDER — CLONIDINE HCL 0.1 MG PO TABS
0.1000 mg | ORAL_TABLET | Freq: Three times a day (TID) | ORAL | Status: DC | PRN
  Filled 2016-12-21: qty 1

## 2016-12-21 MED ORDER — ACETAMINOPHEN 650 MG RE SUPP: 325.0000 mg | Freq: Four times a day (QID) | RECTAL | Status: DC | PRN

## 2016-12-21 MED ORDER — HYDRALAZINE HCL 20 MG/ML IJ SOLN: 10.0000 mg | Freq: Four times a day (QID) | INTRAMUSCULAR | Status: DC | PRN

## 2016-12-21 MED ORDER — OXYCODONE HCL 5 MG PO TABS
2.5000 mg | ORAL_TABLET | Freq: Four times a day (QID) | ORAL | Status: DC | PRN
  Administered 2016-12-21 – 2016-12-28 (×14): 5 mg via ORAL
  Filled 2016-12-21 (×15): qty 1

## 2016-12-21 MED ORDER — ACETAMINOPHEN 500 MG PO TABS
500.0000 mg | ORAL_TABLET | Freq: Four times a day (QID) | ORAL | Status: DC | PRN
  Administered 2016-12-21 – 2016-12-28 (×11): 500 mg via ORAL
  Filled 2016-12-21 (×11): qty 1

## 2016-12-21 MED ORDER — METOPROLOL TARTRATE 12.5 MG HALF TABLET
12.5000 mg | ORAL_TABLET | Freq: Two times a day (BID) | ORAL | Status: DC
  Administered 2016-12-21 – 2016-12-28 (×14): 12.5 mg via ORAL
  Filled 2016-12-21 (×15): qty 1

## 2016-12-21 NOTE — Progress Notes (Signed)
  Speech Language Pathology Treatment: Dysphagia  Patient Details Name: Taylor Brown MRN: [id number] DOB: 04/10/1951 Today's Date: 12/21/2016 Time: 1443-1540 SLP Time Calculation (min) (ACUTE ONLY): 12 min  Assessment / Plan / Recommendation Clinical Impression  Pt with slightly improved mentation today; continues with persisting cough associated with thin liquid consumption.  Suspect chronic component; pt may benefit from esophageal w/u at some point in near future.  Recommend continuing nectar-thick liquids pending surgery.  SLP will follow for safety/diet advancement s/p surgery.   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  While being admitted she reported GIB, in the setting of heavy NSAID use and ETOH (6 beers a day and Vodka). GI consulted, underwent EGD - remarkable for antral gastritis. Dx acute encephalopathy in setting of alcohol withdrawal.  On clear liquids due to GIB.       SLP Plan  Continue with current plan of care       Recommendations  Diet recommendations:  (full liquids thickened to nectar) Liquids provided via: Cup;Straw Medication Administration: Whole meds with liquid Supervision: Patient able to self feed Postural Changes and/or Swallow Maneuvers: Seated upright 90 degrees                Oral Care Recommendations: Oral care BID Follow up Recommendations: None SLP Visit Diagnosis: Dysphagia, unspecified (R13.10) Plan: Continue with current plan of care       GO              [place] Maiorino L. Tivis Ringer, [place] CCC/SLP Pager [phone]   Juan Quam Laurice 12/21/2016, 9:11 AM

## 2016-12-21 NOTE — Progress Notes (Signed)
Nutrition Brief Note  Received MD consult for assessment of nutrition status. Albumin 2.8 low. Prealbumin 9.5 low. Low albumin or prealbumin is no longer used to diagnose malnutrition and are a poor indicator of nutrition status. These labs are reflective of inflammatory process, acute stress response, liver dysfunction and levels are highly affected (decreased) by volume overload. [place] uses the new malnutrition guidelines published by the American Society for Parenteral and Enteral Nutrition (A.S.P.E.N.) and the Academy of Nutrition and Dietetics (AND).    Wt Readings from Last 15 Encounters:  12/18/16 195 lb (88.5 kg)  05/12/12 155 lb 6.8 oz (70.5 kg)    BMI=38 Patient meets criteria for obesity based on current BMI.  Patient declines weight loss.  Nutrition focused physical exam completed.  No muscle or subcutaneous fat depletion noticed.  Current diet order is nectar thick full liquids, patient is consuming approximately 100% of meals at this time. Labs and medications reviewed.   No nutrition interventions warranted at this time. If nutrition issues arise, please re-consult RD. RD to monitor for adequacy of PO intake as diet is advanced.   Molli Barrows, RD, LDN, [place] Pager [phone] After Hours Pager [phone]

## 2016-12-21 NOTE — H&P (Signed)
Attestation signed by Altamese [place], MD at 12/21/2016 8:48 AM  I have seen and examined the patient. I agree with the findings above. Given the location and associated nerve loss in addition to the complexity of her medical condition, Dr. Mack Guise asserted this was outside his scope of practice and that the patient should be transferred to have these injuries evaluated and treated by a fellowship trained orthopaedic traumatologist. Consequently, I was consulted to provide further evaluation and management.  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  Patient requires Step Down Observation and as no beds available yesterday went to the unit. VERY HIGH RISK for complications including infection and nonunion with ETOH and smoking; would recommend SNF post op Appears to have very poor hygiene also  Indication for repair of right humerus in RHD female with post GI scope loss of radial nerve function to include exploration at the fracture site. Posted for Friday to get [place] past the highest risk period through tomorrow of DT's.  Taylor Box, MD 12/21/2016 8:30 AM      Expand All Collapse All   _0 Hide copied text                                     Orthopaedic Trauma Service (OTS) H&P  Reason for Consult: R supracondylar distal humerus fracture with acute radial nerve injury  Referring Physician: Wynetta Emery, MD (ortho, [facility])   HPI: Taylor Brown is an 66 y.o. RHD white female who was originally admitted to the hospital on 12/17/2016. It is reported that the patient had a fall while lifting a bag of dog food. She landed on her right arm. Patient was brought to [facility] for evaluation. In the emergency department there were concerns for melena. Workup in the ED was notable for a right supracondylar humerus fracture. Patient was immobilized with a foam strap immobilizer. She was  admitted to the medical service with orthopedic and GI consults. Patient did have an upper endoscopy at [facility] and was found to have antral gastritis. No acute bleed was noted. Patient was continued on PPI. Follow-up orthopedic examination is notable for acute radial nerve injury. Due to this it was felt that the patient needed to be transferred to a high level of care and was transferred to the orthopedic trauma service at [place].             During evaluation at [facility] it seems that the patient has not followed up with physician in more than 15 years for primary care issues. She was started on some antihypertensives at [place], including Norvasc and metoprolol.             There is reported that the patient drinks at least 6 beers a day and smokes 2 packs cigarettes a day. She was also taking Aleve on a daily basis prior to admission. Patient reported to me today that she drinks vodka daily but was unable to quantify how much. She does have a 12 yorkshire terriers at home. She lives by herself. Prior to admission it is reported that patient is able to perform ADLs.             Is reported that on presentation to [place] patient presented in a quite disheveled state, very dirty and smelling of urine. On evaluation today at [facility] she is still quite  dirty and there is a strong stench of urine. Nursing also reports beginning of skin breakdown in the perineal area.              My evaluation this morning patient is quite confused. She was given Ativan at 0400 this morning which has calmed her down. Patient does not know where she is, why she is here. She does not know the date or year.              On chart review it is unclear if the CIWA protocol was used while she was at [facility]. I did start this protocol on her transfer yesterday. [facility] did arrive [facility] late yesterday afternoon.      Past Medical History:  Diagnosis Date  . Arthritis    osteo  . GERD (gastroesophageal reflux  disease)   . Humerus fracture 12/19/2016   right         Past Surgical History:  Procedure Laterality Date  . ANKLE FRACTURE SURGERY  05/2012  . APPENDECTOMY    . ESOPHAGOGASTRODUODENOSCOPY N/A 12/18/2016   Procedure: ESOPHAGOGASTRODUODENOSCOPY (EGD);  Surgeon: Wilford Corner, MD;  Location: [facility] ENDOSCOPY;  Service: Endoscopy;  Laterality: N/A;  . ORIF ANKLE FRACTURE  05/12/2012   Procedure: OPEN REDUCTION INTERNAL FIXATION (ORIF) ANKLE FRACTURE;  Surgeon: Wylene Simmer, MD;  Location: [place];  Service: Orthopedics;  Laterality: Left;  . ORIF WRIST FRACTURE  05/12/2012   Procedure: OPEN REDUCTION INTERNAL FIXATION (ORIF) WRIST FRACTURE;  Surgeon: Mcarthur Rossetti, MD;  Location: [place];  Service: Orthopedics;  Laterality: Left;  . TUBAL LIGATION    . WRIST FRACTURE SURGERY  05/2012         Family History  Problem Relation Age of Onset  . Hypertension Mother     Social History:  reports that she has been smoking Cigarettes.  She has a 30.00 pack-year smoking history. She has never used smokeless tobacco. She reports that she drinks alcohol. She reports that she does not use drugs.     Allergies: No Known Allergies  Medications:  I have reviewed the patient's current medications. Prior to Admission:         Prescriptions Prior to Admission  Medication Sig Dispense Refill Last Dose  . amLODipine (NORVASC) 5 MG tablet Take 1 tablet (5 mg total) by mouth daily. 30 tablet 0   . docusate sodium 100 MG CAPS Take 100 mg by mouth 2 (two) times daily. (Patient not taking: Reported on 12/17/2016) 30 capsule 0 Completed Course at Unknown time  . esomeprazole (NEXIUM) 20 MG capsule Take 20 mg by mouth as needed.   prn at prn  . loratadine (CLARITIN) 10 MG tablet Take 10 mg by mouth daily as needed. For allergies   prn at prn  . metoprolol tartrate (LOPRESSOR) 25 MG tablet Take 1 tablet (25 mg total) by mouth 2 (two) times daily. 60 tablet 0   . Multiple  Vitamin (MULTIVITAMIN WITH MINERALS) TABS Take 1 tablet by mouth daily. 30 tablet 0   . oxyCODONE (OXY IR/ROXICODONE) 5 MG immediate release tablet Take 1-2 tablets (5-10 mg total) by mouth every 3 (three) hours as needed. (Patient not taking: Reported on 12/17/2016) 40 tablet 0 Completed Course at Unknown time  . pantoprazole (PROTONIX) 40 MG tablet Take 1 tablet (40 mg total) by mouth 2 (two) times daily before a meal. 60 tablet 1   . senna (SENOKOT) 8.6 MG TABS Take 1 tablet (8.6 mg total) by mouth 2 (  two) times daily. (Patient not taking: Reported on 12/17/2016) 60 tablet 0 Completed Course at Unknown time  . [DISCONTINUED] naproxen sodium (ANAPROX) 220 MG tablet Take 220 mg by mouth 2 (two) times daily with a meal.   prn at prn    Lab Results Last 48 Hours        Results for orders placed or performed during the hospital encounter of 12/19/16 (from the past 48 hour(s))  Type and screen [place]     Status: None   Collection Time: 12/19/16  4:32 PM  Result Value Ref Range   ABO/RH(D) O POS    Antibody Screen NEG    Sample Expiration 12/22/2016   ABO/Rh     Status: None   Collection Time: 12/19/16  4:32 PM  Result Value Ref Range   ABO/RH(D) O POS   MRSA PCR Screening     Status: None   Collection Time: 12/19/16  5:29 PM  Result Value Ref Range   MRSA by PCR NEGATIVE NEGATIVE    Comment:        The GeneXpert MRSA Assay (FDA approved for NASAL specimens only), is one component of a comprehensive MRSA colonization surveillance program. It is not intended to diagnose MRSA infection nor to guide or monitor treatment for MRSA infections.   CBC     Status: Abnormal   Collection Time: 12/20/16  4:58 AM  Result Value Ref Range   WBC 7.9 4.0 - 10.5 K/uL   RBC 3.94 3.87 - 5.11 MIL/uL   Hemoglobin 13.6 12.0 - 15.0 g/dL   HCT 42.2 36.0 - 46.0 %   MCV 107.1 (H) 78.0 - 100.0 fL   MCH 34.5 (H) 26.0 - 34.0 pg   MCHC 32.2 30.0 - 36.0 g/dL    RDW 14.9 11.5 - 15.5 %   Platelets 168 150 - 400 K/uL  Comprehensive metabolic panel     Status: Abnormal   Collection Time: 12/20/16  4:58 AM  Result Value Ref Range   Sodium 135 135 - 145 mmol/L   Potassium 3.3 (L) 3.5 - 5.1 mmol/L   Chloride 98 (L) 101 - 111 mmol/L   CO2 24 22 - 32 mmol/L   Glucose, Bld 109 (H) 65 - 99 mg/dL   BUN 6 6 - 20 mg/dL   Creatinine, Ser 0.51 0.44 - 1.00 mg/dL   Calcium 8.1 (L) 8.9 - 10.3 mg/dL   Total Protein 6.0 (L) 6.5 - 8.1 g/dL   Albumin 2.7 (L) 3.5 - 5.0 g/dL   AST 40 15 - 41 U/L   ALT 36 14 - 54 U/L   Alkaline Phosphatase 116 38 - 126 U/L   Total Bilirubin 1.4 (H) 0.3 - 1.2 mg/dL   GFR calc non Af Amer >60 >60 mL/min   GFR calc Af Amer >60 >60 mL/min    Comment: (NOTE) The eGFR has been calculated using the CKD EPI equation. This calculation has not been validated in all clinical situations. eGFR's persistently <60 mL/min signify possible Chronic Kidney Disease.    Anion gap 13 5 - 15     Results for Woehl, JACLYN ANDY (MRN [id number]) as of 12/20/2016 10:15  Ref. Range 12/18/2016 09:17  Bacteria, UA Latest Ref Range: NONE SEEN  RARE (A)  Bilirubin Urine Latest Ref Range: NEGATIVE  NEGATIVE  Color, Urine Latest Ref Range: YELLOW  STRAW (A)  Glucose Latest Ref Range: NEGATIVE mg/dL NEGATIVE  Hgb urine dipstick Latest Ref Range: NEGATIVE  MODERATE (A)  Ketones, ur Latest Ref Range: NEGATIVE mg/dL 20 (A)  Leukocytes, UA Latest Ref Range: NEGATIVE  NEGATIVE  Mucous Unknown PRESENT  Nitrite Latest Ref Range: NEGATIVE  NEGATIVE  pH Latest Ref Range: 5.0 - 8.0  8.0  Protein Latest Ref Range: NEGATIVE mg/dL NEGATIVE  RBC / HPF Latest Ref Range: 0 - 5 RBC/hpf 0-5  Specific Gravity, Urine Latest Ref Range: 1.005 - 1.030  1.008  Squamous Epithelial / LPF Latest Ref Range: NONE SEEN  0-5 (A)  WBC, UA Latest Ref Range: 0 - 5 WBC/hpf 0-5   Results for Flenniken, ASMAA TIRPAK (MRN [id number]) as of 12/20/2016 10:15  Ref. Range 12/17/2016  11:53  Hemoglobin A1C Latest Ref Range: 4.8 - 5.6 % 5.7 (H)    Review of Systems  Unable to perform ROS: Mental status change   Blood pressure (!) 160/90, pulse 100, temperature 98.5 F (36.9 C), temperature source Oral, resp. rate 18, SpO2 93 %. Physical Exam  Constitutional: She appears lethargic. She appears ill.  Older appearing white female. Lying in bed. Not verbalizing much. Confused. Strong odor of urine Unkempt appearance  Cardiovascular: Regular rhythm, S1 normal and S2 normal.  Tachycardia present.   Pulmonary/Chest: No respiratory distress.  Patient does appear to have a slight increase work of breathing Anterior fields are clear Diminished sounds at the bases  Abdominal:  Distended, nontender Umbilicus prominent  Veins visible   Musculoskeletal:  Right Upper Extremity  Inspection:   Soft immobilization device to R UEx        Cuff around distal upper arm and distal forearm         Band around chest     + swelling and ecchymosis to R elbow    + deformity     No deformity to forearm, wrist or hand  Bony eval:    TTP R distal humerus    R shoulder and clavicle w/o tenderness    R forearm, wrist and hand nontender        Gross instability distal upper arm     No instability to forearm, wrist and hand  Soft tissue:    Swelling and ecchymosis to R elbow    Skin is friable     No open wounds or lesions        Skin irritation to R flank from immobilizer   ROM:    Passively can fully range forearm, wrist and hand  Sensation:    Pt report intact sensation along radial, ulnar and median nerves. Axillary nerve sensation is intact   Motor:    Finger flexion and wrist flexion intact      No wrist extension noted    + interosseous function     + DIP flexion intact    EPL appears to be intact as well  Vascular:    Brisk cap refill    + radial pulse    Compartments are soft   Left upper extremity      Ecchymosis to L hand noted     Moderate swelling     No gross deformities     R/U/M motor and sensory functions grossly intact    + Radial pulse     Brisk cap refill     No gross motion with manipulation of L upper extremity   B Lower Extremities   Dressings to B lower legs removed No open wounds  Small areas of maceration noted to distal anterior lower leg Soft tissue changes associated with PVD noted  Dystrophic and long toe nails  Motor and sensory functions grossly intact Mild swelling B LEx  Moving lower extremities without difficulty   Neurological: She appears lethargic.     Assessment/Plan:  66 year old right-hand-dominant female with right supracondylar distal humerus fracture following fall  - Fall  -Right supracondylar distal humerus fracture, closed. Acute radial nerve palsy             Patient will need surgical intervention to address her fracture as well as to explore her nerve givenchange in exam while at [place]             Her current medical condition will not allow [place] to proceed to the OR today. We are hopeful to take the patient to the OR on Friday              Patient was placed into a well-padded posterior long-arm splint with side struts by myself and our [facility] at the bedside. Patient tolerated this well. We did place an ABD pad in her axilla as well to help absorb moisture and hopefully limit skin breakdown.              Patient will be nonweightbearing on her right upper extremity for 8 weeks following surgery.             Concerned with patient's ability to maintain compliance given her medical history including daily alcohol use and 2 pack-a-day history of smoking  - Altered mental status/acute alcoholic withdrawal/ acute encephalopathy              CIWA protocol started yesterday on transfer to [facility]             Last drink was day of admission to [place] (6/9)             Ammonia level pending             Patient is on IV fluids as well                          Internal medicine  will consult and follow along. Greatly appreciate their help              Patient was to be sent to a stepdown unit however there are no stepdown beds available. As such patient will be transferred to ICU. She is unstable at this time to remain on the medical surgical floor  - Medical issues              Hypertension                         Oral medications currently on hold due to mental status changes                         Hydralazine IV every 6 hours as needed                         Restart oral medications once mentation improves              ? Ascites/ ? Liver disease                         CT scan at [place] notable for enhancing lesions in the liver near the dome as well as the anterior segment of the right lobe.  Recommended nonemergent pre-and serial postcontrast CT or MRI for further evaluation                         Check hepatic panel along with coags              Cardiovascular                          Left ventricular hypertrophy noted on CT scan at [place] as well                         We will check echo to evaluate for CHF given history as well as clinical findings, this was discussed with the medical service                         Check BNP                                      Pt appears to have PVD as well               Chronic alcohol use                         As above                         Withdrawal protocol                         Vitamin and mineral supplementation along with IV fluids              Perineal breakdown                         Foley                          Continue Foley until patient is mentating better and can consistently ask for help to assist with toileting              Nicotine dependence                         Absolutely no nicotine products of any kind                         Nicotine patch dc'd                         Directly inhibits bone and wound healing               Social issues                          SW consult for wellness eval, ? eval by adult protective services given condition that pt arrived in at [facility] (disheveled, dirty, smelling of urine)                                     Pt reportedly lives alone with 12  small dogs                - Pain management:             Minimize narcotics             No NSAIDs  - ABL anemia/Hemodynamics             Stable   - DVT/PE prophylaxis:             lovenox per pharmacy consult    - Metabolic Bone Disease:             Check vitamin D levels             Suspect osteoporosis given history  - FEN/GI prophylaxis/Foley/Lines:             protonix              Full liquids             Foley             Pt being bathed by nursing staff before transfer to ICU              Hypokalemia                          Supplement                         Serum magnesium              Nutrition eval                          Check prealbumin                            - Dispo:             Transfer to Unit             Appreciate medicines involvement              No surgery to day for R arm              Continue to monitor             Hopeful for OR Friday    Jari Pigg, PA-C [facility] [phone] (P) 12/20/2016, 9:04 AM

## 2016-12-21 NOTE — Consult Note (Signed)
[place] TEAM 1 - Stepdown/ICU TEAM CONSULT F/U NOTE   Taylor Brown KGU:[id number] DOB: 1951/03/11 DOA: 12/19/2016 PCP: Patient, [name]  Admit HPI / Brief Narrative: 66 y.o. female who has not been seen by a PCP in greater than 15 years w/ HTN and tobacco abuse admitted initially to [facility] after sustaining a R humeral fracture spontaneously as she was lifting an object, hearing a "pop". She was immobilized, and once stable she was transferred to [facility] due to complications with a radial nerve injury requiring complex surgery planned for 6/14.  While being admitted she reported a recent GIB in the setting of heavy NSAID and ETOH use (6 beers a day and Vodka). A subsequent EGD noted only antral gastritis for which she was placed on a PPI.    Subjective: The pt is more alert and oriented today.  She denies cp, sob, n/v, or abdom pain.  She reports the pain in her arm is reasonably controlled.  She lives alone between [place] and [place], but has a daughter in the area.  She has limited insight into the fact that she is an alcoholic.    Recommendations/Plan:  Right distal fracture of the humerus, after fall in the setting of possible osteoporosis Care per Ortho  Acute encephalopathy Probable EtOH withdrawal +/- medication affect - CT of the head without acute abnormality - UA negative - thiamine and B12 levels pending - mental status has improved significantly - stable for the Ortho floor at this time   Alcoholism Counseled pt on need to stop drinking EtOH completely - she tells me she has never felt she had a problem w/ EtOH - she is not likely to stop drinking given her limited insight, but we will cont to educate her on the damage her excessive intake is already causing, and the consequences of ongoing abuse   Hepatic masses CT angio chest at [place] revealed a focus in the anterior segment of the right lobe of the liver 1.7 x 1.7 cm and another near the dome of the liver in  the medial segment left lobe measuring 1.5 x 1.3 cm - this will need further evaluation in the outpt setting   Macrocytosis  Check S28 and folic acid levels - likely related to EtOH abuse  Hypertension  TTE noted EF 65-70% w/ no WMA but w/ grade 1 DD - better BP control long term is required   Antral gastritis - EtOH and NSAID induced S/p EGD at [place]  remarkable for antral gastritis - cont PPI - no evidence of ongoing bleeding at this time   Hypokalemia  Corrected to normal range - follow as pt likely will require further supplementation   Hypomagnesemia Corrected to normal range - follow - will likely require further supplementation   Hyperglycemia  A1c not c/w DM - no need to cont to follow CBSs   Vitamin D deficiency  likely due to nutritional deficiency due to alcoholism - tx as per Ortho   Tobacco abuse smokes 2 ppd   Elevated TSH  TSH 6.45 - Free T4 actually mildly elevated - Free T3 normal - replacement not presently indicate - will need f/u TSH in 8-12 weeks when not under stress   Poor nutritional status despite obesity  in the setting of ETOH abuse    Code Status: FULL Family Communication: no family present at time of exam  Antibiotics: none  DVT prophylaxis: Per Ortho  Objective: Blood pressure 134/80, pulse (!) 105, temperature  98.6 F (37 C), temperature source Oral, resp. rate (!) 26, SpO2 94 %.  Intake/Output Summary (Last 24 hours) at 12/21/16 0837 Last data filed at 12/21/16 0600  Gross per 24 hour  Intake           892.08 ml  Output              750 ml  Net           142.08 ml     Exam: General: No acute respiratory distress - alert and conversant  Lungs: Clear to auscultation bilaterally without wheezes or crackles Cardiovascular: tachycardic - regular - no M or gallup or rub  Abdomen: Nontender, nondistended, soft, bowel sounds positive, no rebound, no ascites, no appreciable mass Extremities: No significant cyanosis, clubbing, or  edema bilateral lower extremities  Data Reviewed: Basic Metabolic Panel:  Recent Labs Lab 12/17/16 1153 12/18/16 0727 12/18/16 1124 12/18/16 1520 12/19/16 0428 12/20/16 0458 12/20/16 1121 12/21/16 0312  NA 143 138  --   --  136 135  --  139  K 2.6* 2.8* 3.4*  --  3.5 3.3*  --  4.0  CL 99* 94*  --   --  98* 98*  --  104  CO2 26 33*  --   --  27 24  --  24  GLUCOSE 121* 103*  --   --  110* 109*  --  124*  BUN 8 <5*  --   --  7 6  --  13  CREATININE 0.54 <0.30*  --   --  0.46 0.51  --  0.64  CALCIUM 8.5* 8.0*  --   --  8.4* 8.1*  --  8.5*  MG 1.5*  --   --  1.5* 1.5*  --  2.0  --   PHOS  --   --   --   --   --   --  2.8  --     CBC:  Recent Labs Lab 12/17/16 1153  12/18/16 0727 12/18/16 1520 12/19/16 0428 12/20/16 0458 12/21/16 0312  WBC 7.2  --  6.8  --  8.2 7.9 7.6  NEUTROABS 5.2  --   --   --   --   --  5.1  HGB 13.4  < > 13.3 14.0 14.0 13.6 13.8  HCT 38.8  --  38.8  --  40.4 42.2 42.5  MCV 104.5*  --  105.3*  --  104.7* 107.1* 106.8*  PLT 215  --  181  --  171 168 194  < > = values in this interval not displayed.  Liver Function Tests:  Recent Labs Lab 12/17/16 1153 12/20/16 0458 12/21/16 0312  AST 89* 40 39  ALT 53 36 34  ALKPHOS 134* 116 105  BILITOT 1.1 1.4* 1.0  PROT 7.0 6.0* 6.2*  ALBUMIN 3.5 2.7* 2.8*    Recent Labs Lab 12/17/16 1139  LIPASE 21    Recent Labs Lab 12/20/16 1001  AMMONIA 18    Coags:  Recent Labs Lab 12/17/16 1153 12/21/16 0312  INR 1.03 1.00    Recent Labs Lab 12/17/16 1153 12/21/16 0312  APTT 29 30    CBG:  Recent Labs Lab 12/20/16 1214 12/21/16 0831  GLUCAP 103* 138*    Recent Results (from the past 240 hour(s))  MRSA PCR Screening     Status: None   Collection Time: 12/19/16  5:29 PM  Result Value Ref Range Status   MRSA by PCR NEGATIVE NEGATIVE  Final    Comment:        The GeneXpert MRSA Assay (FDA approved for NASAL specimens only), is one component of a comprehensive MRSA  colonization surveillance program. It is not intended to diagnose MRSA infection nor to guide or monitor treatment for MRSA infections.   Culture, Urine     Status: Abnormal   Collection Time: 12/20/16 11:10 AM  Result Value Ref Range Status   Specimen Description URINE, RANDOM  Final   Special Requests NONE  Final   Culture <10,000 COLONIES/mL INSIGNIFICANT GROWTH (A)  Final   Report Status 12/21/2016 FINAL  Final     Studies:   Recent x-ray studies have been reviewed in detail by the Attending Physician  Scheduled Meds:  Scheduled Meds: . chlorhexidine  60 mL Topical Once  . docusate sodium  100 mg Oral BID  . folic acid  1 mg Oral Daily  . LORazepam  0-4 mg Oral Q6H   Followed by  . LORazepam  0-4 mg Oral Q12H  . mouth rinse  15 mL Mouth Rinse BID  . multivitamin with minerals  1 tablet Oral Daily  . pantoprazole  40 mg Oral BID AC  . potassium chloride  40 mEq Oral BID  . povidone-iodine  2 application Topical Once  . thiamine  100 mg Oral Daily    Cherene Altes , MD   [facility] Office  [phone] Pager - Text Page per Shea Evans as per below:  On-Call/Text Page:      [email or web address]      password [email or web address]  If 7PM-7AM, please contact night-coverage [email or web address] Password [facility] 12/21/2016, 8:37 AM   LOS: 2 days

## 2016-12-21 NOTE — Progress Notes (Signed)
Paged doctor to let known that patient's urine is dark tea colored. Waiting for callback

## 2016-12-21 NOTE — Progress Notes (Signed)
Orthopaedic Trauma Service (OTS)  Subjective:   Procedure(s) (LRB): OPEN REDUCTION INTERNAL FIXATION (ORIF) DISTAL HUMERUS FRACTURE (Right) Patient reports pain as mild.   Not oriented to Month as guessed April but knew year and day of the week which shows improvement. Speech limited but coherent. Denies return of right radial nerve function.  Objective: Current Vitals Blood pressure 134/80, pulse (!) 105, temperature 98.6 F (37 C), temperature source Oral, resp. rate (!) 26, SpO2 94 %. Vital signs in last 24 hours: Temp:  [97.7 F (36.5 C)-99.2 F (37.3 C)] 98.6 F (37 C) (06/13 0834) Pulse Rate:  [87-120] 105 (06/13 0800) Resp:  [16-35] 26 (06/13 0800) BP: (102-156)/(58-102) 134/80 (06/13 0800) SpO2:  [93 %-97 %] 94 % (06/13 0800) FiO2 (%):  [30 %] 30 % (06/13 0700)  Intake/Output from previous day: 06/12 0701 - 06/13 0700 In: 892.1 [P.O.:340; I.V.:552.1] Out: 750 [Urine:750]  LABS  Recent Labs  12/18/16 1520 12/19/16 0428 12/20/16 0458 12/21/16 0312  HGB 14.0 14.0 13.6 13.8    Recent Labs  12/20/16 0458 12/21/16 0312  WBC 7.9 7.6  RBC 3.94 3.98  HCT 42.2 42.5  PLT 168 194    Recent Labs  12/20/16 0458 12/21/16 0312  NA 135 139  K 3.3* 4.0  CL 98* 104  CO2 24 24  BUN 6 13  CREATININE 0.51 0.64  GLUCOSE 109* 124*  CALCIUM 8.1* 8.5*    Recent Labs  12/21/16 0312  INR 1.00   Physical Exam  Gen:no psychomotor agitation this am Lungs:on 2L O2 Ext: bilateral toenails with dirt and exceedingly long nails RUEx   Splint in place  Sens  Ax/M/U intact; R out  Mot   Ax/ PIN/ M/ AIN/ U intact; R out  Brisk CR  Dirt under fingernails   Imaging Dg Forearm Right  Result Date: 12/20/2016 CLINICAL DATA:  Right arm fracture EXAM: RIGHT FOREARM - 2 VIEW COMPARISON:  None. FINDINGS: No fracture or dislocation is seen in the forearm. Distal humeral fracture may be visible on one of the two views, but is better demonstrated on the prior study.  Overlying cast obscures fine osseous detail. IMPRESSION: No fracture or dislocation is seen in the forearm. Known distal humerus fracture is not well visualized. Electronically Signed   By: Julian Hy M.D.   On: 12/20/2016 11:19   Dg Hand Complete Left  Result Date: 12/20/2016 CLINICAL DATA:  Left hand swelling EXAM: LEFT HAND - COMPLETE 3+ VIEW COMPARISON:  None. FINDINGS: No evidence of acute fracture or dislocation. Status post ORIF of the distal radius. Moderate degenerative changes at the 1st carpometacarpal joint. The visualized soft tissues are unremarkable. IMPRESSION: No acute osseus abnormality is seen. Status post ORIF of the distal radius. Moderate degenerative changes of the 1st carpometacarpal joint. Electronically Signed   By: Julian Hy M.D.   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 I did attempt to explain the extent of complications that could occur with surgery and she did voice acknowledgement of these. Risks and benefits of surgery were discussed, including the possibility of infection, nerve injury, vessel injury, wound breakdown, arthritis, symptomatic hardware, DVT/ PE, loss of motion, and need for  further surgery among others.  We also specifically discussed the failure of nerve recovery and need for subsequent tendon transfers. She acknowledged these risks and wished to proceed.    Altamese Collins, MD [facility] [phone] (p)  12/21/2016, 8:48 AM

## 2016-12-22 LAB — URINALYSIS, ROUTINE W REFLEX MICROSCOPIC
Bilirubin Urine: NEGATIVE
GLUCOSE, UA: NEGATIVE mg/dL
Ketones, ur: NEGATIVE mg/dL
NITRITE: POSITIVE — AB
Protein, ur: 30 mg/dL — AB
Specific Gravity, Urine: 1.016 (ref 1.005–1.030)
pH: 5 (ref 5.0–8.0)

## 2016-12-22 LAB — COMPREHENSIVE METABOLIC PANEL
ALK PHOS: 98 U/L (ref 38–126)
ALT: 31 U/L (ref 14–54)
AST: 34 U/L (ref 15–41)
Albumin: 2.8 g/dL — ABNORMAL LOW (ref 3.5–5.0)
Anion gap: 9 (ref 5–15)
BILIRUBIN TOTAL: 0.9 mg/dL (ref 0.3–1.2)
BUN: 11 mg/dL (ref 6–20)
CO2: 28 mmol/L (ref 22–32)
CREATININE: 0.54 mg/dL (ref 0.44–1.00)
Calcium: 9.3 mg/dL (ref 8.9–10.3)
Chloride: 106 mmol/L (ref 101–111)
GFR calc Af Amer: >60 mL/min (ref >60)
Glucose, Bld: 141 mg/dL — ABNORMAL HIGH (ref 65–99)
Potassium: 4.8 mmol/L (ref 3.5–5.1)
Sodium: 143 mmol/L (ref 135–145)
TOTAL PROTEIN: 6 g/dL — AB (ref 6.5–8.1)

## 2016-12-22 LAB — CBC
HCT: 42.9 % (ref 36.0–46.0)
Hemoglobin: 13.4 g/dL (ref 12.0–15.0)
MCH: 34.4 pg — ABNORMAL HIGH (ref 26.0–34.0)
MCHC: 31.2 g/dL (ref 30.0–36.0)
MCV: 110.3 fL — ABNORMAL HIGH (ref 78.0–100.0)
PLATELETS: 211 10*3/uL (ref 150–400)
RBC: 3.89 MIL/uL (ref 3.87–5.11)
RDW: 15.8 % — AB (ref 11.5–15.5)
WBC: 7 10*3/uL (ref 4.0–10.5)

## 2016-12-22 LAB — RETICULOCYTES
RBC.: 3.89 MIL/uL (ref 3.87–5.11)
Retic Count, Absolute: 155.6 10*3/uL (ref 19.0–186.0)
Retic Ct Pct: 4 % — ABNORMAL HIGH (ref 0.4–3.1)

## 2016-12-22 LAB — IRON AND TIBC
Iron: 42 ug/dL (ref 28–170)
SATURATION RATIOS: 12 % (ref 10.4–31.8)
TIBC: 346 ug/dL (ref 250–450)
UIBC: 304 ug/dL

## 2016-12-22 LAB — FOLATE: FOLATE: 15.5 ng/mL (ref >5.9)

## 2016-12-22 LAB — MAGNESIUM: Magnesium: 1.9 mg/dL (ref 1.7–2.4)

## 2016-12-22 LAB — FERRITIN: Ferritin: 109 ng/mL (ref 11–307)

## 2016-12-22 LAB — VITAMIN B12: Vitamin B-12: 275 pg/mL (ref 180–914)

## 2016-12-22 LAB — VITAMIN B6: Vitamin B6: 2.7 ug/L (ref 2.0–32.8)

## 2016-12-22 MED ORDER — POTASSIUM CHLORIDE IN NACL 20-0.9 MEQ/L-% IV SOLN
INTRAVENOUS | Status: DC
Start: 2016-12-22 — End: 2016-12-24
  Administered 2016-12-22: 12:00:00 via INTRAVENOUS
  Filled 2016-12-22 (×2): qty 1000

## 2016-12-22 MED ORDER — ENOXAPARIN SODIUM 40 MG/0.4ML ~~LOC~~ SOLN
40.0000 mg | SUBCUTANEOUS | Status: DC
  Administered 2016-12-22: 40 mg via SUBCUTANEOUS
  Filled 2016-12-22 (×2): qty 0.4

## 2016-12-22 MED ORDER — VITAMIN D 1000 UNITS PO TABS
2000.0000 [IU] | ORAL_TABLET | Freq: Two times a day (BID) | ORAL | Status: DC
  Administered 2016-12-22 – 2016-12-28 (×12): 2000 [IU] via ORAL
  Filled 2016-12-22 (×13): qty 2

## 2016-12-22 MED ORDER — CEFAZOLIN SODIUM-DEXTROSE 2-4 GM/100ML-% IV SOLN
2.0000 g | Freq: Once | INTRAVENOUS | Status: AC
  Administered 2016-12-23: 2 g via INTRAVENOUS
  Filled 2016-12-22: qty 100

## 2016-12-22 MED ORDER — ENOXAPARIN SODIUM 40 MG/0.4ML ~~LOC~~ SOLN
40.0000 mg | SUBCUTANEOUS | Status: DC
  Administered 2016-12-24 – 2016-12-28 (×5): 40 mg via SUBCUTANEOUS
  Filled 2016-12-22 (×6): qty 0.4

## 2016-12-22 MED ORDER — BOOST / RESOURCE BREEZE PO LIQD
1.0000 | Freq: Two times a day (BID) | ORAL | Status: DC
  Administered 2016-12-22 – 2016-12-25 (×3): 1 via ORAL

## 2016-12-22 NOTE — Progress Notes (Signed)
PROGRESS NOTE        PATIENT DETAILS Name: JOSHUA ZERINGUE Age: 66 y.o. Sex: female Date of Birth: Aug 05, 1950 Admit Date: 12/19/2016 Admitting Physician Altamese Edison, MD AJG:[id number], [name]  Brief Narrative: Patient is a 66 y.o. female with long-standing history of tobacco and alcohol abuse -last seen by a primary M.D. more than 50 years back, transferred from [facility] to the orthopedic service for evaluation of right supracondylar distal humerus fracture with acute radial nerve palsy. Hospitalist service was consulted for management of multiple medical issues. See below for further details  Subjective: Awake-alert-answers all my questions appropriately. Hardly any tremors.   Assessment/Plan: Right supracondylar humerus fracture with acute radial palsy: Deferred to the primary service for further management.  Acute toxic metabolic encephalopathy: Resolved-she is awake and alert. Etiology of encephalopathy felt to be multifactorial likely secondary to alcohol withdrawal, medication effect.  Alcohol withdrawal: Much improved-awake and alert, last drink on 6/9-should be in the last stages of alcohol withdrawal-continue with Ativan per protocol.   Antral gastritis-EtOH an NSAID-induced: Status post EGD at [facility] for antral gastritis-continue with PPI. No overt evidence of bleeding at this time.  Tobacco abuse: Counseled-not sure if she has any indication to quit at this time. We will continue to follow.  Macrocytosis: Folate levels within normal limits-vitamin B-12 level-borderline low. Check methylmalonicacid-if increased- begin B12 supplementation  Multiple hepatic masses: Seen incidentally on CT angiogram of the chest done at [facility] need outpatient evaluation and follow-up.  Hypertension: Continue metoprolol-follow BP trend and adjust accordingly.  Mildly elevated TSH: Likely subclinical hypothyroidism-no indication for  supplementation-stable for outpatient follow-up.  Telemetry (independently reviewed): Sinus tachycardia  Echo (reviewed):EF 65-70% on TTE done on 6/12  DVT Prophylaxis: Prophylactic Lovenox   Code Status: Full code   Disposition Plan: Defer to primary team  Antimicrobial agents: Anti-infectives    Start     Dose/Rate Route Frequency Ordered Stop   12/23/16 1000  ceFAZolin (ANCEF) IVPB 2g/100 mL premix     2 g 200 mL/hr over 30 Minutes Intravenous  Once 12/22/16 0956     12/20/16 0600  ceFAZolin (ANCEF) IVPB 2g/100 mL premix  Status:  Discontinued     2 g 200 mL/hr over 30 Minutes Intravenous To Short Stay 12/19/16 1611 12/21/16 0600      Time spent: 25- minutes-Greater than 50% of this time was spent in counseling, explanation of diagnosis, planning of further management, and coordination of care.  MEDICATIONS: Scheduled Meds: . cholecalciferol  2,000 Units Oral BID  . docusate sodium  100 mg Oral BID  . enoxaparin (LOVENOX) injection  40 mg Subcutaneous Q24H  . feeding supplement  1 Container Oral BID BM  . folic acid  1 mg Oral Daily  . LORazepam  0-4 mg Oral Q12H  . mouth rinse  15 mL Mouth Rinse BID  . metoprolol tartrate  12.5 mg Oral BID  . multivitamin with minerals  1 tablet Oral Daily  . pantoprazole  40 mg Oral BID AC  . thiamine  100 mg Oral Daily   Continuous Infusions: . 0.9 % NaCl with KCl 20 mEq / L    . [START ON 12/23/2016]  ceFAZolin (ANCEF) IV    . methocarbamol (ROBAXIN)  IV     PRN Meds:.acetaminophen **OR** acetaminophen, cloNIDine, hydrALAZINE, LORazepam **OR** LORazepam, methocarbamol **OR** methocarbamol (ROBAXIN)  IV,  morphine injection, oxyCODONE   PHYSICAL EXAM: Vital signs: Vitals:   12/21/16 1143 12/21/16 1520 12/21/16 2113 12/22/16 0545  BP: 119/70 120/78 130/67 137/72  Pulse: 98 (!) 108 99 (!) 107  Resp: 18 18 16 16   Temp: 98.4 F (36.9 C) 99.2 F (37.3 C) 99.4 F (37.4 C) 99.3 F (37.4 C)  TempSrc: Oral Oral Oral Oral    SpO2: 96% 97% 98% 96%   There were no vitals filed for this visit. There is no height or weight on file to calculate BMI.   General appearance :Awake, alert, not in any distress.  Eyes:, pupils equally reactive to light and accomodation HEENT: Atraumatic and Normocephalic Neck: supple, no JVD. No cervical lymphadenopathy.  Resp:Good air entry bilaterally, no added sounds  CVS: S1 S2 regular, no murmurs.  GI: Bowel sounds present, Non tender and not distended with no gaurding, rigidity or rebound. Extremities: B/L Lower Ext shows no edema, both legs are warm to touch Neurology:  speech clear,Non focal, sensation is grossly intact. Psychiatric:Alert and oriented x 3. Musculoskeletal:No digital cyanosis Skin:No Rash, warm and dry Wounds:N/A  I have personally reviewed following labs and imaging studies  LABORATORY DATA: CBC:  Recent Labs Lab 12/17/16 1153  12/18/16 0727 12/18/16 1520 12/19/16 0428 12/20/16 0458 12/20/16 1001 12/21/16 0312 12/22/16 0458  WBC 7.2  --  6.8  --  8.2 7.9  --  7.6 7.0  NEUTROABS 5.2  --   --   --   --   --   --  5.1  --   HGB 13.4  < > 13.3 14.0 14.0 13.6  --  13.8 13.4  HCT 38.8  --  38.8  --  40.4 42.2 38.8 42.5 42.9  MCV 104.5*  --  105.3*  --  104.7* 107.1*  --  106.8* 110.3*  PLT 215  --  181  --  171 168  --  194 211  < > = values in this interval not displayed.  Basic Metabolic Panel:  Recent Labs Lab 12/17/16 1153 12/18/16 0727 12/18/16 1124 12/18/16 1520 12/19/16 0428 12/20/16 0458 12/20/16 1121 12/21/16 0312 12/22/16 0458  NA 143 138  --   --  136 135  --  139 143  K 2.6* 2.8* 3.4*  --  3.5 3.3*  --  4.0 4.8  CL 99* 94*  --   --  98* 98*  --  104 106  CO2 26 33*  --   --  27 24  --  24 28  GLUCOSE 121* 103*  --   --  110* 109*  --  124* 141*  BUN 8 <5*  --   --  7 6  --  13 11  CREATININE 0.54 <0.30*  --   --  0.46 0.51  --  0.64 0.54  CALCIUM 8.5* 8.0*  --   --  8.4* 8.1*  --  8.5* 9.3  MG 1.5*  --   --  1.5* 1.5*   --  2.0  --  1.9  PHOS  --   --   --   --   --   --  2.8  --   --     GFR: Estimated Creatinine Clearance: 68.5 mL/min (by C-G formula based on SCr of 0.54 mg/dL).  Liver Function Tests:  Recent Labs Lab 12/17/16 1153 12/20/16 0458 12/21/16 0312 12/22/16 0458  AST 89* 40 39 34  ALT 53 36 34 31  ALKPHOS 134* 116 105 98  BILITOT 1.1 1.4* 1.0 0.9  PROT 7.0 6.0* 6.2* 6.0*  ALBUMIN 3.5 2.7* 2.8* 2.8*    Recent Labs Lab 12/17/16 1139  LIPASE 21    Recent Labs Lab 12/20/16 1001  AMMONIA 18    Coagulation Profile:  Recent Labs Lab 12/17/16 1153 12/21/16 0312  INR 1.03 1.00    Cardiac Enzymes: No results for input(s): CKTOTAL, CKMB, CKMBINDEX, TROPONINI in the last 168 hours.  BNP (last 3 results) No results for input(s): PROBNP in the last 8760 hours.  HbA1C: No results for input(s): HGBA1C in the last 72 hours.  CBG:  Recent Labs Lab 12/20/16 1214 12/21/16 0831  GLUCAP 103* 138*    Lipid Profile: No results for input(s): CHOL, HDL, LDLCALC, TRIG, CHOLHDL, LDLDIRECT in the last 72 hours.  Thyroid Function Tests:  Recent Labs  12/20/16 1001 12/20/16 1538  TSH 6.450*  --   FREET4  --  1.17*  T3FREE  --  3.5    Anemia Panel:  Recent Labs  12/22/16 0458  VITAMINB12 275  FOLATE 15.5  FERRITIN 109  TIBC 346  IRON 42  RETICCTPCT 4.0*    Urine analysis:    Component Value Date/Time   COLORURINE YELLOW 12/20/2016 1110   APPEARANCEUR CLEAR 12/20/2016 1110   LABSPEC 1.014 12/20/2016 1110   PHURINE 6.0 12/20/2016 1110   GLUCOSEU NEGATIVE 12/20/2016 1110   HGBUR SMALL (A) 12/20/2016 1110   BILIRUBINUR NEGATIVE 12/20/2016 1110   KETONESUR 80 (A) 12/20/2016 1110   PROTEINUR NEGATIVE 12/20/2016 1110   UROBILINOGEN 0.2 05/13/2012 1440   NITRITE NEGATIVE 12/20/2016 1110   LEUKOCYTESUR NEGATIVE 12/20/2016 1110    Sepsis Labs: Lactic Acid, Venous No results found for: LATICACIDVEN  MICROBIOLOGY: Recent Results (from the past 240  hour(s))  MRSA PCR Screening     Status: None   Collection Time: 12/19/16  5:29 PM  Result Value Ref Range Status   MRSA by PCR NEGATIVE NEGATIVE Final    Comment:        The GeneXpert MRSA Assay (FDA approved for NASAL specimens only), is one component of a comprehensive MRSA colonization surveillance program. It is not intended to diagnose MRSA infection nor to guide or monitor treatment for MRSA infections.   Culture, Urine     Status: Abnormal   Collection Time: 12/20/16 11:10 AM  Result Value Ref Range Status   Specimen Description URINE, RANDOM  Final   Special Requests NONE  Final   Culture <10,000 COLONIES/mL INSIGNIFICANT GROWTH (A)  Final   Report Status 12/21/2016 FINAL  Final    RADIOLOGY STUDIES/RESULTS: Dg Forearm Right  Result Date: 12/20/2016 CLINICAL DATA:  Right arm fracture EXAM: RIGHT FOREARM - 2 VIEW COMPARISON:  None. FINDINGS: No fracture or dislocation is seen in the forearm. Distal humeral fracture may be visible on one of the two views, but is better demonstrated on the prior study. Overlying cast obscures fine osseous detail. IMPRESSION: No fracture or dislocation is seen in the forearm. Known distal humerus fracture is not well visualized. Electronically Signed   By: Julian Hy M.D.   On: 12/20/2016 11:19   Ct Angio Chest Pe W Or Wo Contrast  Result Date: 12/17/2016 CLINICAL DATA:  Shortness of Breath EXAM: CT ANGIOGRAPHY CHEST WITH CONTRAST TECHNIQUE: Multidetector CT imaging of the chest was performed using the standard protocol during bolus administration of intravenous contrast. Multiplanar CT image reconstructions and MIPs were obtained to evaluate the vascular anatomy. CONTRAST:  75 mL Isovue 370 nonionic COMPARISON:  Chest radiograph December 17, 2016 FINDINGS: Cardiovascular: There is no demonstrable pulmonary embolus. There is no thoracic aortic aneurysm or dissection. Main pulmonary outflow tract measures 3.1 cm in length. There is mild  calcification at the origin of the left subclavian artery. Other visualized great vessels appear unremarkable. Pericardium is not appreciably thickened. There is left ventricular hypertrophy. Mediastinum/Nodes: Visualized thyroid appears unremarkable. There is no appreciable thoracic adenopathy. Lungs/Pleura: There is mild bibasilar atelectasis. There is no parenchymal lung edema or consolidation. No pleural effusion or pleural thickening evident. Upper Abdomen: There is diffuse hepatic steatosis. There is an enhancing focus in the anterior segment of the right lobe of the liver measuring 1.7 x 1.7 cm. A similar appearing lesion is noted near the dome of the liver in the medial segment left lobe measuring 1.5 x 1.3 cm. No other focal liver lesions are apparent ; note that liver is incompletely visualized. Visualized upper abdominal structures otherwise appear unremarkable. Musculoskeletal: There is degenerative change in the thoracic spine. There are no blastic or lytic bone lesions. There are several healed rib fractures on the left. Review of the MIP images confirms the above findings. IMPRESSION: No demonstrable pulmonary embolus. Prominence of the main pulmonary outflow tract suggests a degree of pulmonary arterial hypertension. No adenopathy. No lung edema or consolidation.  Bibasilar atelectasis noted. Enhancing lesions in the liver near the dome noted. Etiology uncertain. This finding warrants nonemergent pre and serial post-contrast CT or MR of the liver to further evaluate. Old healed rib fractures on the left. Left ventricular hypertrophy. Electronically Signed   By: Lowella Grip III M.D.   On: 12/17/2016 13:51   Dg Chest Portable 1 View  Result Date: 12/17/2016 CLINICAL DATA:  Shortness of breath and tachycardia EXAM: PORTABLE CHEST 1 VIEW COMPARISON:  05/13/2012 FINDINGS: Cardiomegaly noted. There is no evidence of focal airspace disease, pulmonary edema, suspicious pulmonary nodule/mass, pleural  effusion, or pneumothorax. No acute bony abnormalities are identified. IMPRESSION: Cardiomegaly without evidence of acute cardiopulmonary disease. Electronically Signed   By: Margarette Canada M.D.   On: 12/17/2016 12:11   Dg Humerus Right  Result Date: 12/17/2016 CLINICAL DATA:  Acute onset pain while lifting heavy object EXAM: RIGHT HUMERUS - 2+ VIEW COMPARISON:  None. FINDINGS: Frontal and lateral views were obtained. There is an obliquely oriented fracture of the distal humerus with medial angulation and anterior displacement of the distal fracture fragment with respect proximal fragment. No other fractures. No dislocations. No abnormal periosteal reaction or appreciable arthropathy. IMPRESSION: Obliquely oriented fracture distal humerus with displacement angulation of fracture fragments. No dislocation. No abnormal periosteal reaction. No appreciable arthropathic change. Electronically Signed   By: Lowella Grip III M.D.   On: 12/17/2016 11:43   Dg Hand Complete Left  Result Date: 12/20/2016 CLINICAL DATA:  Left hand swelling EXAM: LEFT HAND - COMPLETE 3+ VIEW COMPARISON:  None. FINDINGS: No evidence of acute fracture or dislocation. Status post ORIF of the distal radius. Moderate degenerative changes at the 1st carpometacarpal joint. The visualized soft tissues are unremarkable. IMPRESSION: No acute osseus abnormality is seen. Status post ORIF of the distal radius. Moderate degenerative changes of the 1st carpometacarpal joint. Electronically Signed   By: Julian Hy M.D.   On: 12/20/2016 11:10     LOS: 3 days   Oren Binet, MD  [facility] Pager:[phone]  If 7PM-7AM, please contact night-coverage [email or web address] Password [email or web address] 12/22/2016, 11:02 AM

## 2016-12-22 NOTE — Progress Notes (Signed)
[facility] Progress Note    Subjective:  Pt on 5N now Appears much better Awake, sitting up in bed, conversant   Did not sleep well last night  Lives alone in mobile home, 2 steps in front. Several steps in the back  12 yorkshire terriers at home   Pt admits that her nutritional habits are poor. Mostly takeout and frozen dinners She drinks daily (vodka) and smokes 1.5-2 ppd  She states that her R arm broke while she was throwing the bag of dog food (8 lbs bag) into her truck. She did not fall  She states that she did have some antecedent elbow pain for a few days prior to her fracture but did not recall any additional trauma    Review of Systems  Constitutional: Negative for chills and fever.  Respiratory: Negative for shortness of breath and wheezing.   Cardiovascular: Negative for chest pain and palpitations.  Gastrointestinal: Negative for nausea and vomiting.    Objective:   VITALS:   Vitals:   12/21/16 1143 12/21/16 1520 12/21/16 2113 12/22/16 0545  BP: 119/70 120/78 130/67 137/72  Pulse: 98 (!) 108 99 (!) 107  Resp: 18 18 16 16   Temp: 98.4 F (36.9 C) 99.2 F (37.3 C) 99.4 F (37.4 C) 99.3 F (37.4 C)  TempSrc: Oral Oral Oral Oral  SpO2: 96% 97% 98% 96%    Intake/Output      06/13 0701 - 06/14 0700 06/14 0701 - 06/15 0700   P.O. 360    I.V.     Total Intake 360     Urine 650    Stool 0    Total Output 650     Net -290            LABS  Results for orders placed or performed during the hospital encounter of 12/19/16 (from the past 24 hour(s))  Vitamin B12     Status: None   Collection Time: 12/22/16  4:58 AM  Result Value Ref Range   Vitamin B-12 275 180 - 914 pg/mL  Folate     Status: None   Collection Time: 12/22/16  4:58 AM  Result Value Ref Range   Folate 15.5 >5.9 ng/mL  Iron and TIBC     Status: None   Collection Time: 12/22/16  4:58 AM  Result Value Ref Range   Iron 42 28 - 170 ug/dL   TIBC 346  250 - 450 ug/dL   Saturation Ratios 12 10.4 - 31.8 %   UIBC 304 ug/dL  Ferritin     Status: None   Collection Time: 12/22/16  4:58 AM  Result Value Ref Range   Ferritin 109 11 - 307 ng/mL  Reticulocytes     Status: Abnormal   Collection Time: 12/22/16  4:58 AM  Result Value Ref Range   Retic Ct Pct 4.0 (H) 0.4 - 3.1 %   RBC. 3.89 3.87 - 5.11 MIL/uL   Retic Count, Manual 155.6 19.0 - 186.0 K/uL  Comprehensive metabolic panel     Status: Abnormal   Collection Time: 12/22/16  4:58 AM  Result Value Ref Range   Sodium 143 135 - 145 mmol/L   Potassium 4.8 3.5 - 5.1 mmol/L   Chloride 106 101 - 111 mmol/L   CO2 28 22 - 32 mmol/L   Glucose, Bld 141 (H) 65 - 99 mg/dL   BUN 11 6 - 20 mg/dL   Creatinine, Ser 0.54 0.44 - 1.00 mg/dL   Calcium 9.3  8.9 - 10.3 mg/dL   Total Protein 6.0 (L) 6.5 - 8.1 g/dL   Albumin 2.8 (L) 3.5 - 5.0 g/dL   AST 34 15 - 41 U/L   ALT 31 14 - 54 U/L   Alkaline Phosphatase 98 38 - 126 U/L   Total Bilirubin 0.9 0.3 - 1.2 mg/dL   GFR calc non Af Amer >60 >60 mL/min   GFR calc Af Amer >60 >60 mL/min   Anion gap 9 5 - 15  CBC     Status: Abnormal   Collection Time: 12/22/16  4:58 AM  Result Value Ref Range   WBC 7.0 4.0 - 10.5 K/uL   RBC 3.89 3.87 - 5.11 MIL/uL   Hemoglobin 13.4 12.0 - 15.0 g/dL   HCT 42.9 36.0 - 46.0 %   MCV 110.3 (H) 78.0 - 100.0 fL   MCH 34.4 (H) 26.0 - 34.0 pg   MCHC 31.2 30.0 - 36.0 g/dL   RDW 15.8 (H) 11.5 - 15.5 %   Platelets 211 150 - 400 K/uL  Magnesium     Status: None   Collection Time: 12/22/16  4:58 AM  Result Value Ref Range   Magnesium 1.9 1.7 - 2.4 mg/dL     PHYSICAL EXAM:   Gen: sitting up in bed, NAD, A&O x 3. Appears much better Lungs: breathing unlabored Cardiac: tachy but regular   Abd: NT, distended/protuberent  Ext:       Right Upper Extremity   Splint and sling fitting well  Ext warm   Brisk cap refill  Weak finger extension   Intact finger flexion and abd/add  Weak IP extension of thumb   Diminished  radial nerve sensation along dorsal 1st web spce  Ulnar and median nerve sensation intact  Swelling well controlled  No pain with passive stretch   Assessment/Plan:     Principal Problem:   Right supracondylar humerus fracture Active Problems:   Alcohol dependence (HCC)   GI bleed   Acute encephalopathy   Alcohol abuse   Essential hypertension   Hypokalemia   Nicotine dependence   Acute radial nerve palsy of right upper extremity   Liver lesion   Anti-infectives    Start     Dose/Rate Route Frequency Ordered Stop   12/20/16 0600  ceFAZolin (ANCEF) IVPB 2g/100 mL premix  Status:  Discontinued     2 g 200 mL/hr over 30 Minutes Intravenous To Short Stay 12/19/16 1611 12/21/16 0600    .  POD/HD#: 51  66 year old right-hand-dominant female with right supracondylar distal humerus fracture     -Right supracondylar distal humerus fracture, closed. Acute radial nerve palsy (Holstein-Lewis fracture)             OR tomorrow for ORIF and nerve exploration  Patient will be nonweightbearing on her right upper extremity for 8 weeks following surgery.  Will likely allow immediate elbow ROM post op               Concerned with patient's ability to maintain compliance given her medical history including daily alcohol use and 2 pack-a-day history of smoking  Pt at high risk for complications given EtOH use, nicotine use and poor nutrition    - Altered mental status/acute alcoholic withdrawal/ acute encephalopathy              much improved  CIWA  Appreciate medicines assistance     - Medical issues  Hypertension                         BP is stable    Metoprolol and norvasc that were started at [facility] are still on hold                ? Ascites/ ? Liver disease                         CT scan at [place] notable for enhancing lesions in the liver near the dome as well as the anterior segment of the right lobe.                         Recommended nonemergent pre-and  serial postcontrast CT or MRI for further evaluation                         will need outpatient evaluation                Cardiovascular                          Left ventricular hypertrophy noted on CT scan at [place] as well                         Echo consistent with grade 1 diastolic dysfunction                 Chronic alcohol use                         As above                         Withdrawal protocol                         Vitamin and mineral supplementation along with IV fluids               Perineal breakdown                         Foley                          Continue Foley until patient is mentating better and can consistently ask for help to assist with toileting               Nicotine dependence                         Absolutely no nicotine products of any kind                         Nicotine patch dc'd                         Directly inhibits bone and wound healing                Social issues                         SW consult for wellness eval, ? eval by adult protective services  given condition that pt arrived in at [employer], dirty, smelling of urine)                                     Pt reportedly lives alone with 12 small dogs                  - Pain management:             Minimize narcotics             No NSAIDs   - ABL anemia/Hemodynamics             Stable   Cbc in am    - DVT/PE prophylaxis:             lovenox per pharmacy consult      - Metabolic Bone Disease:             vitamin d deficiency   Elevated calcitriol likely indicates longs standing vitamin d deficiency and poor nutritional intake  Supplement    - FEN/GI prophylaxis/Foley/Lines:             protonix   Restarted IVF at 100 cc/hr    Urine very dark   Repeat U/A             Full liquids   NPO after MN              Foley                Hypokalemia                        improved                Nutrition eval                          poor  nutritional status based on labs and pt report                             - Dispo:             OR tomorrow for ORIF R humerus     Jari Pigg, PA-C [facility] [phone] (P) [phone] (O) 12/22/2016, 9:07 AM

## 2016-12-22 NOTE — Progress Notes (Signed)
ANTICOAGULATION CONSULT NOTE  Pharmacy Consult for Lovenox Indication: VTE prophylaxis   Assessment: 19 yof scheduled for OR tomorrow for ORIF R humerus. Pharmacy consulted to dose Lovenox for VTE ppx. Not on anticoagulation PTA. CBC wnl. No bleed documented. CrCl~65-70.  Goal of Therapy:  VTE prevention Monitor platelets by anticoagulation protocol: Yes   Plan:  Lovenox 40mg  [place] q24h Monitor CBC at least q72h, s/sx bleeding  Elicia Lamp, PharmD, BCPS Clinical Pharmacist 12/22/2016 10:03 AM

## 2016-12-23 ENCOUNTER — Inpatient Hospital Stay (HOSPITAL_COMMUNITY): Payer: Medicare HMO | Admitting: Anesthesiology

## 2016-12-23 ENCOUNTER — Encounter (HOSPITAL_COMMUNITY)
Admission: AD | Disposition: A | Discharge status: Skilled Nursing Facility | Payer: Self-pay | Source: Other Acute Inpatient Hospital | Attending: Orthopedic Surgery

## 2016-12-23 ENCOUNTER — Encounter (HOSPITAL_COMMUNITY): Payer: Self-pay | Admitting: Surgery

## 2016-12-23 ENCOUNTER — Inpatient Hospital Stay (HOSPITAL_COMMUNITY): Payer: Medicare HMO

## 2016-12-23 DIAGNOSIS — N63 Unspecified lump in unspecified breast: Secondary | ICD-10-CM

## 2016-12-23 DIAGNOSIS — M84421A Pathological fracture, right humerus, initial encounter for fracture: Principal | ICD-10-CM

## 2016-12-23 DIAGNOSIS — Z803 Family history of malignant neoplasm of breast: Secondary | ICD-10-CM

## 2016-12-23 DIAGNOSIS — K769 Liver disease, unspecified: Secondary | ICD-10-CM

## 2016-12-23 DIAGNOSIS — Z720 Tobacco use: Secondary | ICD-10-CM

## 2016-12-23 HISTORY — PX: TOENAIL TRIMMING: SHX6631

## 2016-12-23 HISTORY — PX: ORIF HUMERUS FRACTURE: SHX2126

## 2016-12-23 LAB — COMPREHENSIVE METABOLIC PANEL
ALT: 31 U/L (ref 14–54)
ANION GAP: 12 (ref 5–15)
AST: 38 U/L (ref 15–41)
Albumin: 2.8 g/dL — ABNORMAL LOW (ref 3.5–5.0)
Alkaline Phosphatase: 107 U/L (ref 38–126)
BUN: 11 mg/dL (ref 6–20)
CALCIUM: 9.1 mg/dL (ref 8.9–10.3)
CHLORIDE: 103 mmol/L (ref 101–111)
CO2: 27 mmol/L (ref 22–32)
Creatinine, Ser: 0.56 mg/dL (ref 0.44–1.00)
GFR calc non Af Amer: >60 mL/min (ref >60)
Glucose, Bld: 110 mg/dL — ABNORMAL HIGH (ref 65–99)
Potassium: 4.5 mmol/L (ref 3.5–5.1)
Sodium: 142 mmol/L (ref 135–145)
Total Bilirubin: 0.9 mg/dL (ref 0.3–1.2)
Total Protein: 6.4 g/dL — ABNORMAL LOW (ref 6.5–8.1)

## 2016-12-23 LAB — BASIC METABOLIC PANEL
Anion gap: 9 (ref 5–15)
BUN: 9 mg/dL (ref 6–20)
CO2: 27 mmol/L (ref 22–32)
Calcium: 8.9 mg/dL (ref 8.9–10.3)
Chloride: 101 mmol/L (ref 101–111)
Creatinine, Ser: 0.58 mg/dL (ref 0.44–1.00)
GFR calc Af Amer: >60 mL/min (ref >60)
GLUCOSE: 135 mg/dL — AB (ref 65–99)
Potassium: 4.2 mmol/L (ref 3.5–5.1)
Sodium: 137 mmol/L (ref 135–145)

## 2016-12-23 LAB — CBC
HCT: 42.9 % (ref 36.0–46.0)
Hemoglobin: 13.5 g/dL (ref 12.0–15.0)
MCH: 34.7 pg — ABNORMAL HIGH (ref 26.0–34.0)
MCHC: 31.5 g/dL (ref 30.0–36.0)
MCV: 110.3 fL — ABNORMAL HIGH (ref 78.0–100.0)
Platelets: 257 10*3/uL (ref 150–400)
RBC: 3.89 MIL/uL (ref 3.87–5.11)
RDW: 15.5 % (ref 11.5–15.5)
WBC: 7.3 10*3/uL (ref 4.0–10.5)

## 2016-12-23 LAB — TRANSFERRIN: TRANSFERRIN: 246 mg/dL (ref 192–382)

## 2016-12-23 LAB — SURGICAL PCR SCREEN
MRSA, PCR: NEGATIVE
STAPHYLOCOCCUS AUREUS: NEGATIVE

## 2016-12-23 SURGERY — OPEN REDUCTION INTERNAL FIXATION (ORIF) DISTAL HUMERUS FRACTURE
Anesthesia: Regional | Site: Foot | Laterality: Right

## 2016-12-23 MED ORDER — BUPIVACAINE-EPINEPHRINE (PF) 0.5% -1:200000 IJ SOLN
INTRAMUSCULAR | Status: DC | PRN
  Administered 2016-12-23: 30 mL via PERINEURAL

## 2016-12-23 MED ORDER — ONDANSETRON HCL 4 MG/2ML IJ SOLN
INTRAMUSCULAR | Status: AC
  Filled 2016-12-23: qty 2

## 2016-12-23 MED ORDER — SUGAMMADEX SODIUM 200 MG/2ML IV SOLN
INTRAVENOUS | Status: DC | PRN
  Administered 2016-12-23: 200 mg via INTRAVENOUS

## 2016-12-23 MED ORDER — PHENYLEPHRINE HCL 10 MG/ML IJ SOLN
INTRAVENOUS | Status: DC | PRN
  Administered 2016-12-23: 50 ug/min via INTRAVENOUS

## 2016-12-23 MED ORDER — EPHEDRINE SULFATE 50 MG/ML IJ SOLN
INTRAMUSCULAR | Status: DC | PRN
  Administered 2016-12-23: 20 mg via INTRAVENOUS

## 2016-12-23 MED ORDER — OXYCODONE HCL 5 MG/5ML PO SOLN: 5.0000 mg | Freq: Once | ORAL | Status: DC | PRN

## 2016-12-23 MED ORDER — FENTANYL CITRATE (PF) 250 MCG/5ML IJ SOLN
INTRAMUSCULAR | Status: AC
  Filled 2016-12-23: qty 5

## 2016-12-23 MED ORDER — PHENYLEPHRINE 40 MCG/ML (10ML) SYRINGE FOR IV PUSH (FOR BLOOD PRESSURE SUPPORT)
PREFILLED_SYRINGE | INTRAVENOUS | Status: AC
  Filled 2016-12-23: qty 10

## 2016-12-23 MED ORDER — MIDAZOLAM HCL 2 MG/2ML IJ SOLN
INTRAMUSCULAR | Status: AC
  Filled 2016-12-23: qty 2

## 2016-12-23 MED ORDER — OXYCODONE HCL 5 MG PO TABS: 5.0000 mg | ORAL_TABLET | Freq: Once | ORAL | Status: DC | PRN

## 2016-12-23 MED ORDER — ONDANSETRON HCL 4 MG/2ML IJ SOLN
INTRAMUSCULAR | Status: DC | PRN
Start: 2016-12-23 — End: 2016-12-23
  Administered 2016-12-23: 4 mg via INTRAVENOUS

## 2016-12-23 MED ORDER — LACTATED RINGERS IV SOLN
INTRAVENOUS | Status: DC
  Administered 2016-12-23: 10:00:00 via INTRAVENOUS

## 2016-12-23 MED ORDER — MEPERIDINE HCL 25 MG/ML IJ SOLN: 6.2500 mg | INTRAMUSCULAR | Status: DC | PRN

## 2016-12-23 MED ORDER — HYDROMORPHONE HCL 1 MG/ML IJ SOLN: 0.2500 mg | INTRAMUSCULAR | Status: DC | PRN

## 2016-12-23 MED ORDER — SUGAMMADEX SODIUM 200 MG/2ML IV SOLN
INTRAVENOUS | Status: AC
  Filled 2016-12-23: qty 2

## 2016-12-23 MED ORDER — FENTANYL CITRATE (PF) 100 MCG/2ML IJ SOLN
INTRAMUSCULAR | Status: DC | PRN
  Administered 2016-12-23 (×2): 50 ug via INTRAVENOUS

## 2016-12-23 MED ORDER — EPHEDRINE 5 MG/ML INJ
INTRAVENOUS | Status: AC
  Filled 2016-12-23: qty 10

## 2016-12-23 MED ORDER — PROPOFOL 10 MG/ML IV BOLUS
INTRAVENOUS | Status: AC
  Filled 2016-12-23: qty 20

## 2016-12-23 MED ORDER — MIDAZOLAM HCL 5 MG/5ML IJ SOLN
INTRAMUSCULAR | Status: DC | PRN
  Administered 2016-12-23: 2 mg via INTRAVENOUS

## 2016-12-23 MED ORDER — LACTATED RINGERS IV SOLN
INTRAVENOUS | Status: DC | PRN
  Administered 2016-12-23 (×2): via INTRAVENOUS

## 2016-12-23 MED ORDER — 0.9 % SODIUM CHLORIDE (POUR BTL) OPTIME
TOPICAL | Status: DC | PRN
  Administered 2016-12-23: 1000 mL

## 2016-12-23 MED ORDER — ROCURONIUM BROMIDE 100 MG/10ML IV SOLN
INTRAVENOUS | Status: DC | PRN
  Administered 2016-12-23: 20 mg via INTRAVENOUS
  Administered 2016-12-23: 40 mg via INTRAVENOUS
  Administered 2016-12-23 (×2): 20 mg via INTRAVENOUS

## 2016-12-23 MED ORDER — LIDOCAINE 2% (20 MG/ML) 5 ML SYRINGE
INTRAMUSCULAR | Status: AC
  Filled 2016-12-23: qty 5

## 2016-12-23 MED ORDER — CEFAZOLIN SODIUM-DEXTROSE 1-4 GM/50ML-% IV SOLN
1.0000 g | Freq: Three times a day (TID) | INTRAVENOUS | Status: AC
  Administered 2016-12-23 – 2016-12-24 (×3): 1 g via INTRAVENOUS
  Filled 2016-12-23 (×4): qty 50

## 2016-12-23 MED ORDER — PROPOFOL 10 MG/ML IV BOLUS
INTRAVENOUS | Status: DC | PRN
  Administered 2016-12-23: 120 mg via INTRAVENOUS

## 2016-12-23 MED ORDER — PROMETHAZINE HCL 25 MG/ML IJ SOLN: 6.2500 mg | INTRAMUSCULAR | Status: DC | PRN

## 2016-12-23 MED ORDER — ROCURONIUM BROMIDE 10 MG/ML (PF) SYRINGE
PREFILLED_SYRINGE | INTRAVENOUS | Status: AC
  Filled 2016-12-23: qty 5

## 2016-12-23 MED ORDER — PHENYLEPHRINE HCL 10 MG/ML IJ SOLN
INTRAMUSCULAR | Status: DC | PRN
  Administered 2016-12-23 (×3): 80 ug via INTRAVENOUS

## 2016-12-23 MED ORDER — IPRATROPIUM-ALBUTEROL 0.5-2.5 (3) MG/3ML IN SOLN
3.0000 mL | Freq: Four times a day (QID) | RESPIRATORY_TRACT | Status: DC | PRN
  Administered 2016-12-23: 3 mL via RESPIRATORY_TRACT
  Filled 2016-12-23: qty 3

## 2016-12-23 MED ORDER — KETOROLAC TROMETHAMINE 30 MG/ML IJ SOLN: 30.0000 mg | Freq: Once | INTRAMUSCULAR | Status: DC | PRN

## 2016-12-23 MED ORDER — LIDOCAINE HCL (CARDIAC) 20 MG/ML IV SOLN
INTRAVENOUS | Status: DC | PRN
  Administered 2016-12-23: 100 mg via INTRAVENOUS

## 2016-12-23 SURGICAL SUPPLY — 70 items
BANDAGE ELASTIC 3 VELCRO ST LF (GAUZE/BANDAGES/DRESSINGS) ×1 IMPLANT
BANDAGE ELASTIC 4 VELCRO ST LF (GAUZE/BANDAGES/DRESSINGS) ×1 IMPLANT
BIT DRILL LCP QC 2X140 (BIT) ×1 IMPLANT
BLADE AVERAGE 25X9 (BLADE) IMPLANT
BNDG CMPR 9X4 STRL LF SNTH (GAUZE/BANDAGES/DRESSINGS) ×2
BNDG COHESIVE 4X5 TAN STRL (GAUZE/BANDAGES/DRESSINGS) ×1 IMPLANT
BNDG ESMARK 4X9 LF (GAUZE/BANDAGES/DRESSINGS) ×3 IMPLANT
BNDG GAUZE ELAST 4 BULKY (GAUZE/BANDAGES/DRESSINGS) ×6 IMPLANT
BRUSH SCRUB SURG 4.25 DISP (MISCELLANEOUS) ×6 IMPLANT
CORDS BIPOLAR (ELECTRODE) ×3 IMPLANT
COVER MAYO STAND STRL (DRAPES) ×1 IMPLANT
COVER SURGICAL LIGHT HANDLE (MISCELLANEOUS) ×6 IMPLANT
DRAIN PENROSE 1/4X12 LTX STRL (WOUND CARE) IMPLANT
DRAPE C-ARM 42X72 X-RAY (DRAPES) ×3 IMPLANT
DRAPE C-ARMOR (DRAPES) IMPLANT
DRAPE HALF SHEET 40X57 (DRAPES) ×3 IMPLANT
DRAPE INCISE IOBAN 66X45 STRL (DRAPES) IMPLANT
DRAPE ORTHO SPLIT 77X108 STRL (DRAPES)
DRAPE SURG ORHT 6 SPLT 77X108 (DRAPES) IMPLANT
DRAPE U-SHAPE 47X51 STRL (DRAPES) ×6 IMPLANT
DRSG ADAPTIC 3X8 NADH LF (GAUZE/BANDAGES/DRESSINGS) ×3 IMPLANT
DRSG MEPITEL 4X7.2 (GAUZE/BANDAGES/DRESSINGS) ×1 IMPLANT
DRSG PAD ABDOMINAL 8X10 ST (GAUZE/BANDAGES/DRESSINGS) ×3 IMPLANT
ELECT CAUTERY BLADE 6.4 (BLADE) ×1 IMPLANT
ELECT REM PT RETURN 9FT ADLT (ELECTROSURGICAL) ×3
ELECTRODE REM PT RTRN 9FT ADLT (ELECTROSURGICAL) ×2 IMPLANT
EVACUATOR 1/8 PVC DRAIN (DRAIN) IMPLANT
GAUZE SPONGE 4X4 12PLY STRL (GAUZE/BANDAGES/DRESSINGS) ×5 IMPLANT
GLOVE BIO SURGEON STRL SZ7.5 (GLOVE) ×3 IMPLANT
GLOVE BIOGEL PI IND STRL 7.5 (GLOVE) ×2 IMPLANT
GLOVE BIOGEL PI IND STRL 8 (GLOVE) ×2 IMPLANT
GLOVE BIOGEL PI INDICATOR 7.5 (GLOVE) ×1
GLOVE BIOGEL PI INDICATOR 8 (GLOVE) ×1
GOWN STRL REUS W/ TWL LRG LVL3 (GOWN DISPOSABLE) ×4 IMPLANT
GOWN STRL REUS W/ TWL XL LVL3 (GOWN DISPOSABLE) ×2 IMPLANT
GOWN STRL REUS W/TWL LRG LVL3 (GOWN DISPOSABLE) ×6
GOWN STRL REUS W/TWL XL LVL3 (GOWN DISPOSABLE) ×3
KIT BASIN OR (CUSTOM PROCEDURE TRAY) ×3 IMPLANT
KIT ROOM TURNOVER OR (KITS) ×3 IMPLANT
MANIFOLD NEPTUNE II (INSTRUMENTS) ×3 IMPLANT
NDL HYPO 25X1 1.5 SAFETY (NEEDLE) ×2 IMPLANT
NEEDLE HYPO 25X1 1.5 SAFETY (NEEDLE) ×3 IMPLANT
NS IRRIG 1000ML POUR BTL (IV SOLUTION) ×3 IMPLANT
PACK ORTHO EXTREMITY (CUSTOM PROCEDURE TRAY) ×3 IMPLANT
PAD ARMBOARD 7.5X6 YLW CONV (MISCELLANEOUS) ×6 IMPLANT
PAD CAST 4YDX4 CTTN HI CHSV (CAST SUPPLIES) IMPLANT
PADDING CAST COTTON 4X4 STRL (CAST SUPPLIES) ×3
PLATE DIST HUMERUS PL 2.7/3.7 (Plate) ×1 IMPLANT
SCREW LOCK VA ST 2.7X14 (Screw) ×1 IMPLANT
SCREW LOCKING 2.7X16MM VA (Screw) ×3 IMPLANT
SCREW METAPHYSCAL 18MM (Screw) ×1 IMPLANT
SLING ARM FOAM STRAP LRG (SOFTGOODS) ×1 IMPLANT
SPONGE LAP 18X18 X RAY DECT (DISPOSABLE) IMPLANT
STAPLER VISISTAT 35W (STAPLE) ×4 IMPLANT
STOCKINETTE IMPERVIOUS 9X36 MD (GAUZE/BANDAGES/DRESSINGS) IMPLANT
SUCTION FRAZIER HANDLE 10FR (MISCELLANEOUS) ×1
SUCTION TUBE FRAZIER 10FR DISP (MISCELLANEOUS) ×2 IMPLANT
SUT ETHILON 3 0 PS 1 (SUTURE) ×9 IMPLANT
SUT VIC AB 0 CT1 27 (SUTURE) ×6
SUT VIC AB 0 CT1 27XBRD ANBCTR (SUTURE) ×4 IMPLANT
SUT VIC AB 2-0 CT1 27 (SUTURE) ×6
SUT VIC AB 2-0 CT1 TAPERPNT 27 (SUTURE) ×4 IMPLANT
SYR 5ML LL (SYRINGE) IMPLANT
SYR CONTROL 10ML LL (SYRINGE) ×3 IMPLANT
TOWEL OR 17X24 6PK STRL BLUE (TOWEL DISPOSABLE) ×3 IMPLANT
TOWEL OR 17X26 10 PK STRL BLUE (TOWEL DISPOSABLE) ×9 IMPLANT
TRAY FOLEY W/METER SILVER 16FR (SET/KITS/TRAYS/PACK) IMPLANT
TUBE CONNECTING 12X1/4 (SUCTIONS) ×1 IMPLANT
WATER STERILE IRR 1000ML POUR (IV SOLUTION) ×3 IMPLANT
YANKAUER SUCT BULB TIP NO VENT (SUCTIONS) IMPLANT

## 2016-12-23 NOTE — Transfer of Care (Signed)
Immediate Anesthesia Transfer of Care Note  Patient: Taylor Brown  Procedure(s) Performed: Procedure(s): OPEN REDUCTION INTERNAL FIXATION (ORIF) DISTAL HUMERUS FRACTURE (Right) TOENAIL TRIMMING (Bilateral)  Patient Location: PACU  Anesthesia Type:General  Level of Consciousness: awake  Airway & Oxygen Therapy: Patient Spontanous Breathing and Patient connected to nasal cannula oxygen  Post-op Assessment: Report given to RN and Post -op Vital signs reviewed and stable  Post vital signs: Reviewed and stable  Last Vitals:  Vitals:   12/23/16 0400 12/23/16 1318  BP: 133/63 (!) 171/83  Pulse: 88 (!) 114  Resp: 16 (!) 28  Temp: 37.3 C (P) 36.1 C    Last Pain:  Vitals:   12/23/16 0400  TempSrc: Oral  PainSc:       Patients Stated Pain Goal: 1 (37/04/88 8916)  Complications: No apparent anesthesia complications

## 2016-12-23 NOTE — Brief Op Note (Signed)
12/19/2016 - 12/23/2016  12:47 PM  PATIENT:  Taylor Brown  66 y.o. female  PRE-OPERATIVE DIAGNOSIS:   1. RIGHT DISTAL HUMERAL SHAFT FRACTURE 2. RADIAL NERVE PALSY 3. OVERGROWN TOENAILS  POST-OPERATIVE DIAGNOSIS:   1. RIGHT DISTAL HUMERAL SHAFT FRACTURE, SUSPECTED PATHOLOGIC 2. INTACT RADIAL NERVE 3. OVERGROWN TOENAILS 4. LEFT BREAST MASS  PROCEDURE:  Procedure(s): 1. OPEN REDUCTION INTERNAL FIXATION (ORIF) DISTAL HUMERAL SHAFT FRACTURE (Right) 2. EXPLORATION OF RADIAL NERVE 3. TOENAIL TRIMMING (Bilateral) 4. INTRAOPERATIVE CONSULTATION FOR LEFT BREAST MASS  SURGEON:  Surgeon(s) and Role:    * Altamese McHenry, MD - Primary  PHYSICIAN ASSISTANT: Ainsley Spinner, PAC  ANESTHESIA:   general  EBL:  Total I/O In: -  Out: 250 [Urine:250]  BLOOD ADMINISTERED:none  DRAINS: none   LOCAL MEDICATIONS USED:  NONE  SPECIMEN:  Humeral shaft lesion  DISPOSITION OF SPECIMEN:  PATHOLOGY AND MICRO  COUNTS:  YES  TOURNIQUET:    DICTATION: .Other Dictation: Dictation Number [id number]....  PLAN OF CARE: Admit to inpatient   PATIENT DISPOSITION:  PACU - hemodynamically stable.   Delay start of Pharmacological VTE agent (>24hrs) due to surgical blood loss or risk of bleeding: no

## 2016-12-23 NOTE — Anesthesia Procedure Notes (Signed)
Procedure Name: Intubation Date/Time: 12/23/2016 10:15 AM Performed by: Manus Gunning, Rilie Glanz J Pre-anesthesia Checklist: Patient identified, Emergency Drugs available, Patient being monitored, Suction available and Timeout performed Patient Re-evaluated:Patient Re-evaluated prior to inductionOxygen Delivery Method: Circle system utilized Preoxygenation: Pre-oxygenation with 100% oxygen Intubation Type: IV induction Ventilation: Mask ventilation without difficulty Laryngoscope Size: Mac and 3 Grade View: Grade I Tube type: Oral Tube size: 7.0 mm Number of attempts: 1 Airway Equipment and Method: Stylet Placement Confirmation: ETT inserted through vocal cords under direct vision,  positive ETCO2 and breath sounds checked- equal and bilateral Secured at: 21 cm Tube secured with: Tape Dental Injury: Teeth and Oropharynx as per pre-operative assessment

## 2016-12-23 NOTE — Anesthesia Procedure Notes (Addendum)
Anesthesia Regional Block: Supraclavicular block   Pre-Anesthetic Checklist: ,, timeout performed, Correct Patient, Correct Site, Correct Laterality, Correct Procedure, Correct Position, site marked, Risks and benefits discussed,  Surgical consent,  Pre-op evaluation,  At surgeon's request and post-op pain management  Laterality: Right  Prep: chloraprep       Needles:  Injection technique: Single-shot  Needle Type: Stimiplex          Additional Needles:   Procedures: ultrasound guided,,,,,,,,  Narrative:  Start time: 12/23/2016 9:50 AM End time: 12/23/2016 9:53 AM Injection made incrementally with aspirations every 5 mL.  Performed by: Personally  Anesthesiologist: Nolon Nations  Additional Notes: Risks, benefits and alternative to block explained extensively.  Patient tolerated procedure well, without complications.

## 2016-12-23 NOTE — Care Management Important Message (Signed)
Important Message  Patient Details  Name: Taylor Brown MRN: [id number] Date of Birth: [birth year]   Medicare Important Message Given:  Yes    Taylor Brown 12/23/2016, 2:45 PM

## 2016-12-23 NOTE — Progress Notes (Addendum)
I discussed with the patient the risks and benefits of surgery for repair of her humerus and limited exploration of her radial nerve, including the possibility of infection, nonunion, malunion, nerve injury, vessel injury, wound breakdown, arthritis, symptomatic hardware, DVT/ PE, loss of motion, and need for further surgery among others.  She acknowledged these risks and wished to proceed.  We also discussed toenail trimming of both feet as they pose significant health and fall risk. This could result in nail fracture risk and infection and is a concern whether performed now or in delayed fashion because of arm fixation. If feasible, plan to perform pre-fixation of humerus and to limit extent so as to reduce chance of bacteremia. She did wish to proceed.  Altamese West Buechel, MD [facility] [phone] (p)

## 2016-12-23 NOTE — Progress Notes (Signed)
PROGRESS NOTE        PATIENT DETAILS Name: Taylor Brown Age: 66 y.o. Sex: female Date of Birth: [birth year] Admit Date: 12/19/2016 Admitting Physician [place], MD UEK:[id number], [name]  Brief Narrative: Patient is a 66 y.o. female with long-standing history of tobacco and alcohol abuse -last seen by a primary M.D. more than 50 years back, transferred from [facility] to the orthopedic service for evaluation of right supracondylar distal humerus fracture with acute radial nerve palsy. Hospitalist service was consulted for management of multiple medical issues. See below for further details  Subjective: Awake and alert-no tremors     Assessment/Plan: Right supracondylar humerus fracture with acute radial palsy: Deferred to the primary service for further management.   Acute toxic metabolic encephalopathy: Resolved-she is awake and alert. Etiology felt to be multifactorial-alcohol withdrawal and medication effect.   Alcohol withdrawal: Much improved-no tremors seen this morning. Last drink was on 6/9. Continue with supportive care. Counseled regarding need to completely stop drinking.   Antral gastritis-EtOH an NSAID-induced: Status post EGD at [facility] for antral gastritis, continue with PPI. No evidence of overt bleeding at this time.   Tobacco abuse: Counseled-not sure if she has any desire to quit at this time. We will continue to counsel.   Macrocytosis: Likely secondary to alcohol use-folate levels within normal limits, vitamin B12 levels borderline low-await methylmalonic levels-if increased, will begin vitamin B12 supplementation.   Multiple hepatic masses: Seen incidentally on CT angiogram of the chest done at [facility], will need outpatient evaluation and follow-up.   Hypertension: Controlled with metoprolol.   Mildly elevated TSH: Likely subclinical hypothyroidism-no indication for supplementation-stable for outpatient follow-up.  Telemetry  (independently reviewed): 5 beats of NSVT.  Echo (reviewed):EF 65-70% on TTE done on 6/12  DVT Prophylaxis: Prophylactic Lovenox   Code Status: Full code   Disposition Plan: Defer to primary team  Antimicrobial agents: Anti-infectives    Start     Dose/Rate Route Frequency Ordered Stop   12/23/16 1000  ceFAZolin (ANCEF) IVPB 2g/100 mL premix     2 g 200 mL/hr over 30 Minutes Intravenous  Once 12/22/16 0956     12/20/16 0600  ceFAZolin (ANCEF) IVPB 2g/100 mL premix  Status:  Discontinued     2 g 200 mL/hr over 30 Minutes Intravenous To Short Stay 12/19/16 1611 12/21/16 0600      Time spent: 25- minutes-Greater than 50% of this time was spent in counseling, explanation of diagnosis, planning of further management, and coordination of care.  MEDICATIONS: Scheduled Meds: . [MAR Hold] cholecalciferol  2,000 Units Oral BID  . [MAR Hold] docusate sodium  100 mg Oral BID  . [MAR Hold] enoxaparin (LOVENOX) injection  40 mg Subcutaneous Q24H  . [MAR Hold] feeding supplement  1 Container Oral BID BM  . [MAR Hold] folic acid  1 mg Oral Daily  . LORazepam  0-4 mg Oral Q12H  . [MAR Hold] mouth rinse  15 mL Mouth Rinse BID  . [MAR Hold] metoprolol tartrate  12.5 mg Oral BID  . [MAR Hold] multivitamin with minerals  1 tablet Oral Daily  . [MAR Hold] pantoprazole  40 mg Oral BID AC  . [MAR Hold] thiamine  100 mg Oral Daily   Continuous Infusions: . 0.9 % NaCl with KCl 20 mEq / L 100 mL/hr at 12/22/16 1223  .  ceFAZolin (  ANCEF) IV    . lactated ringers 10 mL/hr at 12/23/16 0930  . [MAR Hold] methocarbamol (ROBAXIN)  IV     PRN Meds:.[MAR Hold] acetaminophen **OR** [MAR Hold] acetaminophen, [MAR Hold] cloNIDine, [MAR Hold] hydrALAZINE, [MAR Hold] methocarbamol **OR** [MAR Hold] methocarbamol (ROBAXIN)  IV, [MAR Hold]  morphine injection, [MAR Hold] oxyCODONE   PHYSICAL EXAM: Vital signs: Vitals:   12/22/16 0545 12/22/16 1500 12/22/16 2051 12/23/16 0400  BP: 137/72 138/68 128/64  133/63  Pulse: (!) 107 99 100 88  Resp: 16 18 18 16   Temp: 99.3 F (37.4 C) 98.9 F (37.2 C) 99.4 F (37.4 C) 99.1 F (37.3 C)  TempSrc: Oral Oral Oral Oral  SpO2: 96% 95% 96% 97%   There were no vitals filed for this visit. There is no height or weight on file to calculate BMI.   General appearance :Awake, alert, not in any distress.  Eyes:, pupils equally reactive to light and accomodation,no scleral icterus. HEENT: Atraumatic and Normocephalic Neck: supple, no JVD. Resp:Good air entry bilaterally CVS: S1 S2 regular, no murmurs.  GI: Bowel sounds present, Non tender and not distended with no gaurding, rigidity or rebound. Extremities: B/L Lower Ext shows no edema, both legs are warm to touch Neurology:  speech clear,Non focal, sensation is grossly intact. Psychiatric: Normal judgment and insight. Normal mood. Musculoskeletal:No digital cyanosis Skin:No Rash, warm and dry Wounds:N/A  I have personally reviewed following labs and imaging studies  LABORATORY DATA: CBC:  Recent Labs Lab 12/17/16 1153  12/19/16 0428 12/20/16 0458 12/20/16 1001 12/21/16 0312 12/22/16 0458 12/23/16 0355  WBC 7.2  < > 8.2 7.9  --  7.6 7.0 7.3  NEUTROABS 5.2  --   --   --   --  5.1  --   --   HGB 13.4  < > 14.0 13.6  --  13.8 13.4 13.5  HCT 38.8  < > 40.4 42.2 38.8 42.5 42.9 42.9  MCV 104.5*  < > 104.7* 107.1*  --  106.8* 110.3* 110.3*  PLT 215  < > 171 168  --  194 211 257  < > = values in this interval not displayed.  Basic Metabolic Panel:  Recent Labs Lab 12/17/16 1153  12/18/16 1520 12/19/16 0428 12/20/16 0458 12/20/16 1121 12/21/16 0312 12/22/16 0458 12/23/16 0355  NA 143  < >  --  136 135  --  139 143 142  K 2.6*  < >  --  3.5 3.3*  --  4.0 4.8 4.5  CL 99*  < >  --  98* 98*  --  104 106 103  CO2 26  < >  --  27 24  --  24 28 27   GLUCOSE 121*  < >  --  110* 109*  --  124* 141* 110*  BUN 8  < >  --  7 6  --  13 11 11   CREATININE 0.54  < >  --  0.46 0.51  --  0.64 0.54  0.56  CALCIUM 8.5*  < >  --  8.4* 8.1*  --  8.5* 9.3 9.1  MG 1.5*  --  1.5* 1.5*  --  2.0  --  1.9  --   PHOS  --   --   --   --   --  2.8  --   --   --   < > = values in this interval not displayed.  GFR: Estimated Creatinine Clearance: 68.5 mL/min (by C-G formula based  on SCr of 0.56 mg/dL).  Liver Function Tests:  Recent Labs Lab 12/17/16 1153 12/20/16 0458 12/21/16 0312 12/22/16 0458 12/23/16 0355  AST 89* 40 39 34 38  ALT 53 36 34 31 31  ALKPHOS 134* 116 105 98 107  BILITOT 1.1 1.4* 1.0 0.9 0.9  PROT 7.0 6.0* 6.2* 6.0* 6.4*  ALBUMIN 3.5 2.7* 2.8* 2.8* 2.8*    Recent Labs Lab 12/17/16 1139  LIPASE 21    Recent Labs Lab 12/20/16 1001  AMMONIA 18    Coagulation Profile:  Recent Labs Lab 12/17/16 1153 12/21/16 0312  INR 1.03 1.00    Cardiac Enzymes: No results for input(s): CKTOTAL, CKMB, CKMBINDEX, TROPONINI in the last 168 hours.  BNP (last 3 results) No results for input(s): PROBNP in the last 8760 hours.  HbA1C: No results for input(s): HGBA1C in the last 72 hours.  CBG:  Recent Labs Lab 12/20/16 1214 12/21/16 0831  GLUCAP 103* 138*    Lipid Profile: No results for input(s): CHOL, HDL, LDLCALC, TRIG, CHOLHDL, LDLDIRECT in the last 72 hours.  Thyroid Function Tests:  Recent Labs  12/20/16 1001 12/20/16 1538  TSH 6.450*  --   FREET4  --  1.17*  T3FREE  --  3.5    Anemia Panel:  Recent Labs  12/22/16 0458  VITAMINB12 275  FOLATE 15.5  FERRITIN 109  TIBC 346  IRON 42  RETICCTPCT 4.0*    Urine analysis:    Component Value Date/Time   COLORURINE YELLOW 12/22/2016 2210   APPEARANCEUR HAZY (A) 12/22/2016 2210   LABSPEC 1.016 12/22/2016 2210   PHURINE 5.0 12/22/2016 2210   GLUCOSEU NEGATIVE 12/22/2016 2210   HGBUR MODERATE (A) 12/22/2016 2210   BILIRUBINUR NEGATIVE 12/22/2016 2210   KETONESUR NEGATIVE 12/22/2016 2210   PROTEINUR 30 (A) 12/22/2016 2210   UROBILINOGEN 0.2 05/13/2012 1440   NITRITE POSITIVE (A)  12/22/2016 2210   LEUKOCYTESUR LARGE (A) 12/22/2016 2210    Sepsis Labs: Lactic Acid, Venous No results found for: LATICACIDVEN  MICROBIOLOGY: Recent Results (from the past 240 hour(s))  MRSA PCR Screening     Status: None   Collection Time: 12/19/16  5:29 PM  Result Value Ref Range Status   MRSA by PCR NEGATIVE NEGATIVE Final    Comment:        The GeneXpert MRSA Assay (FDA approved for NASAL specimens only), is one component of a comprehensive MRSA colonization surveillance program. It is not intended to diagnose MRSA infection nor to guide or monitor treatment for MRSA infections.   Culture, Urine     Status: Abnormal   Collection Time: 12/20/16 11:10 AM  Result Value Ref Range Status   Specimen Description URINE, RANDOM  Final   Special Requests NONE  Final   Culture <10,000 COLONIES/mL INSIGNIFICANT GROWTH (A)  Final   Report Status 12/21/2016 FINAL  Final  Surgical pcr screen     Status: None   Collection Time: 12/22/16  9:09 PM  Result Value Ref Range Status   MRSA, PCR NEGATIVE NEGATIVE Final   Staphylococcus aureus NEGATIVE NEGATIVE Final    Comment:        The Xpert SA Assay (FDA approved for NASAL specimens in patients over 49 years of age), is one component of a comprehensive surveillance program.  Test performance has been validated by [facility] for patients greater than or equal to 67 year old. It is not intended to diagnose infection nor to guide or monitor treatment.     RADIOLOGY STUDIES/RESULTS:  Dg Forearm Right  Result Date: 12/20/2016 CLINICAL DATA:  Right arm fracture EXAM: RIGHT FOREARM - 2 VIEW COMPARISON:  None. FINDINGS: No fracture or dislocation is seen in the forearm. Distal humeral fracture may be visible on one of the two views, but is better demonstrated on the prior study. Overlying cast obscures fine osseous detail. IMPRESSION: No fracture or dislocation is seen in the forearm. Known distal humerus fracture is not well  visualized. Electronically Signed   By: Julian Hy M.D.   On: 12/20/2016 11:19   Ct Angio Chest Pe W Or Wo Contrast  Result Date: 12/17/2016 CLINICAL DATA:  Shortness of Breath EXAM: CT ANGIOGRAPHY CHEST WITH CONTRAST TECHNIQUE: Multidetector CT imaging of the chest was performed using the standard protocol during bolus administration of intravenous contrast. Multiplanar CT image reconstructions and MIPs were obtained to evaluate the vascular anatomy. CONTRAST:  75 mL Isovue 370 nonionic COMPARISON:  Chest radiograph December 17, 2016 FINDINGS: Cardiovascular: There is no demonstrable pulmonary embolus. There is no thoracic aortic aneurysm or dissection. Main pulmonary outflow tract measures 3.1 cm in length. There is mild calcification at the origin of the left subclavian artery. Other visualized great vessels appear unremarkable. Pericardium is not appreciably thickened. There is left ventricular hypertrophy. Mediastinum/Nodes: Visualized thyroid appears unremarkable. There is no appreciable thoracic adenopathy. Lungs/Pleura: There is mild bibasilar atelectasis. There is no parenchymal lung edema or consolidation. No pleural effusion or pleural thickening evident. Upper Abdomen: There is diffuse hepatic steatosis. There is an enhancing focus in the anterior segment of the right lobe of the liver measuring 1.7 x 1.7 cm. A similar appearing lesion is noted near the dome of the liver in the medial segment left lobe measuring 1.5 x 1.3 cm. No other focal liver lesions are apparent ; note that liver is incompletely visualized. Visualized upper abdominal structures otherwise appear unremarkable. Musculoskeletal: There is degenerative change in the thoracic spine. There are no blastic or lytic bone lesions. There are several healed rib fractures on the left. Review of the MIP images confirms the above findings. IMPRESSION: No demonstrable pulmonary embolus. Prominence of the main pulmonary outflow tract suggests a  degree of pulmonary arterial hypertension. No adenopathy. No lung edema or consolidation.  Bibasilar atelectasis noted. Enhancing lesions in the liver near the dome noted. Etiology uncertain. This finding warrants nonemergent pre and serial post-contrast CT or MR of the liver to further evaluate. Old healed rib fractures on the left. Left ventricular hypertrophy. Electronically Signed   By: Lowella Grip III M.D.   On: 12/17/2016 13:51   Dg Chest Portable 1 View  Result Date: 12/17/2016 CLINICAL DATA:  Shortness of breath and tachycardia EXAM: PORTABLE CHEST 1 VIEW COMPARISON:  05/13/2012 FINDINGS: Cardiomegaly noted. There is no evidence of focal airspace disease, pulmonary edema, suspicious pulmonary nodule/mass, pleural effusion, or pneumothorax. No acute bony abnormalities are identified. IMPRESSION: Cardiomegaly without evidence of acute cardiopulmonary disease. Electronically Signed   By: Margarette Canada M.D.   On: 12/17/2016 12:11   Dg Humerus Right  Result Date: 12/17/2016 CLINICAL DATA:  Acute onset pain while lifting heavy object EXAM: RIGHT HUMERUS - 2+ VIEW COMPARISON:  None. FINDINGS: Frontal and lateral views were obtained. There is an obliquely oriented fracture of the distal humerus with medial angulation and anterior displacement of the distal fracture fragment with respect proximal fragment. No other fractures. No dislocations. No abnormal periosteal reaction or appreciable arthropathy. IMPRESSION: Obliquely oriented fracture distal humerus with displacement angulation of fracture fragments. No dislocation.  No abnormal periosteal reaction. No appreciable arthropathic change. Electronically Signed   By: Lowella Grip III M.D.   On: 12/17/2016 11:43   Dg Hand Complete Left  Result Date: 12/20/2016 CLINICAL DATA:  Left hand swelling EXAM: LEFT HAND - COMPLETE 3+ VIEW COMPARISON:  None. FINDINGS: No evidence of acute fracture or dislocation. Status post ORIF of the distal radius. Moderate  degenerative changes at the 1st carpometacarpal joint. The visualized soft tissues are unremarkable. IMPRESSION: No acute osseus abnormality is seen. Status post ORIF of the distal radius. Moderate degenerative changes of the 1st carpometacarpal joint. Electronically Signed   By: Julian Hy M.D.   On: 12/20/2016 11:10     LOS: 4 days   Oren Binet, MD  [facility] Pager:[phone]  If 7PM-7AM, please contact night-coverage [email or web address] Password [facility] 12/23/2016, 9:53 AM

## 2016-12-23 NOTE — Progress Notes (Signed)
Informed by RN-that patient SOB On my arrival-appears comfortable-getting a neb  O/e Lungs-moving air well bilaterally-no rhonchi  Will await CXR Continue current measures.

## 2016-12-23 NOTE — Progress Notes (Signed)
Orthopaedic Trauma Service Progress Note  Pt with low energy mechanism fracture to R distal humerus. Stated it broke by simply tossing bags of dog food (no more than 8 lbs) in her car. Also reported some R arm pain prior to her break (Pt is RHD)  Pt was initially evaluated at [facility] (12/17/2016) and was subsequently transferred to [facility] for definitive management of her R humerus fracture.  She did have a CT angio at [facility] which found some enhancing lesions on her liver. outpt follow up was recommended  [facility], she was found to be going through DTs at that time.  12/22/2016 was there first mentally clear day she had.  It was at this time that she noted that she did not fall and that her fracture was essentially atraumatic.  It was at this point that we had concern for possible pathologic fracture of her R humerus. Pt was taken to OR on 12/23/2016, intra-operatively her fracture did look as what we would expect for an acute fracture. Thus further increasing our suspicion for pathologic fracture.   As we were getting ready to move the pt back to her hospital bed, a large indurated, fixed, nonmobile lesion was noted to her left breast (slightly anterior to midaxillary line). Weeping sore noted in middle of lesion. 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  See picture in media tab, however picture does not clearly demonstrate size of lesion.   Pt has a 60 pack year history  Drinks liquor daily (vodka) Poor nutrition   Metastatic bone survey ordered  Discussed with medicine Consulted oncology who will see over weekend  Discussed findings with patient Seems very indifferent to the information  States lesion on her L breast has been there about 6 months Has not been to PCP in 15+ years, last mammogram was about that time as well per her report    Jari Pigg, PA-C [facility] [phone] (P) 12/23/2016 4:33 PM
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
## 2016-12-23 NOTE — Anesthesia Preprocedure Evaluation (Addendum)
Anesthesia Evaluation  Patient identified by MRN, date of birth, ID band Patient awake    Reviewed: Allergy & Precautions, H&P , NPO status , Patient's Chart, lab work & pertinent test results, reviewed documented beta blocker date and time   History of Anesthesia Complications Negative for: history of anesthetic complications  Airway Mallampati: IV  TM Distance: >3 FB Neck ROM: full    Dental  (+) Edentulous Upper, Missing, Dental Advidsory Given, Poor Dentition, Loose   Pulmonary shortness of breath and with exertion, neg sleep apnea, COPD, neg recent URI, Current Smoker,     + decreased breath sounds      Cardiovascular Exercise Tolerance: Good hypertension, (-) angina(-) CAD, (-) Past MI, (-) Cardiac Stents and (-) CABG (-) dysrhythmias (-) Valvular Problems/Murmurs Rhythm:Regular Rate:Normal     Neuro/Psych PSYCHIATRIC DISORDERS negative neurological ROS  negative psych ROS   GI/Hepatic GERD  ,(+)     substance abuse (6 beers per day)  alcohol use,   Endo/Other  negative endocrine ROS  Renal/GU negative Renal ROS  negative genitourinary   Musculoskeletal  (+) Arthritis ,   Abdominal (+) + obese,   Peds  Hematology negative hematology ROS (+)   Anesthesia Other Findings Past Medical History: No date: Arthritis     Comment: osteo No date: GERD (gastroesophageal reflux disease)   Reproductive/Obstetrics negative OB ROS                            Anesthesia Physical  Anesthesia Plan  ASA: III  Anesthesia Plan: General and Regional   Post-op Pain Management: GA combined w/ Regional for post-op pain   Induction: Intravenous  PONV Risk Score and Plan: 2 and Ondansetron, Propofol and Dexamethasone  Airway Management Planned: Oral ETT  Additional Equipment:   Intra-op Plan:   Post-operative Plan: Extubation in OR  Informed Consent: I have reviewed the patients History and  Physical, chart, labs and discussed the procedure including the risks, benefits and alternatives for the proposed anesthesia with the patient or authorized representative who has indicated his/her understanding and acceptance.   Dental advisory given  Plan Discussed with: CRNA  Anesthesia Plan Comments: (Discussed possible peripheral block with Dr. Marcelino Scot and we feel that given profound nerve palsy already a regional anesthetic poses little additional risk from a neurologic standpoint. Further, with her comorbidities, reduced need for opioids post op is likely to provide safety benefit.  I discussed this with the patient as well and she accepted.)      Anesthesia Quick Evaluation

## 2016-12-23 NOTE — Progress Notes (Signed)
NT asked RN to assist in repositioning pt in bed. On assessment, pt's breathing looks shallow and labored- she states that she did feel slightly short of breath. RR was 28. O2 is 96% on 3L, HR is 110, pt denies pain. Lungs were diminished. Dr. Sloan Leiter notified. Pt reported relief after duoneb administered. Will continue to monitor.   [place], Jerry Caras

## 2016-12-23 NOTE — Progress Notes (Signed)
Orthopedic Tech Progress Note Patient Details:  Taylor Brown Oct 14, 1950 [id number]  Ortho Devices Type of Ortho Device: Velcro wrist forearm splint Ortho Device/Splint Location: RUE Ortho Device/Splint Interventions: Ordered, Application   Braulio Bosch 12/23/2016, 5:57 PM

## 2016-12-23 NOTE — Progress Notes (Signed)
SLP Cancellation Note  Patient Details Name: Taylor Brown MRN: [id number] DOB: Nov 11, 1950   Cancelled treatment:        Unable to see with po's. Pt in surgery. Will continue efforts.   [place] Siren 12/23/2016, 4:03 PM

## 2016-12-23 NOTE — Progress Notes (Signed)
CCMD called abt pt's 6 beat run of Vtach. Assessed pt. Pt was calm and laying in bed with no symptoms. Will cont. To monitor.

## 2016-12-23 NOTE — Consult Note (Signed)
Referral MD  Reason for Referral: Metastatic breast cancer-pathologic fracture of the right humerus; left breast mass; liver lesions   No chief complaint on file. : No history given  HPI: Taylor Brown is a very nice 66 year old white female. She has been devoid of any medical care for about 40 or 50 years. She lives by herself. She has 12 dogs.  She apparently was moving a bag of dog food. She felt a pop in her right arm. She went to [facility]. She was found have a fracture of the right humerus.  She drinks heavily. She drinks 5 to. She smokes a pack per day.  She has a past history of GI bleeding with antral gastritis found on upper endoscopy.  Apparently, her situation was too complex for [place] to deal with. She was transferred to come on hospital. She underwent repair of the pathologic fracture. It was felt that this was a pathologic fracture. Biopsies have been sent off and are pending.  Ultimately, it was noted that she had a mass in the left breast. This is at about the 1:00 position. She said that she has had this for about 6 months. Surgery came by and felt this was a breast cancer.  She had a CT angiogram done. This shows some liver nodules.  She does not state any obvious weight loss.  There is a history of breast cancer with her mother. She's not sure how old her mother was when she had breast cancer.  She had withdrawal symptoms. She now appears to be alert and oriented.  Her labs today look right. Her calcium is 9.1 with an albumin of 2.8. Her hemoglobin is 13.5. White cell count 7.3.  Somebody ordered a bone survey. This did not show any obvious lytic lesions outside of a 13 x 8 mm focus in the occiput of the skull.  We are asked to see her. Again there is no pathologic diagnosis yet.    Past Medical History:  Diagnosis Date  . 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 Arthritis    osteo  . GERD (gastroesophageal reflux disease)   .  Humerus fracture 12/19/2016   right  . Liver lesion 12/20/2016  . Nicotine dependence 12/20/2016  :  Past Surgical History:  Procedure Laterality Date  . ANKLE FRACTURE SURGERY  05/2012  . APPENDECTOMY    . ESOPHAGOGASTRODUODENOSCOPY N/A 12/18/2016   Procedure: ESOPHAGOGASTRODUODENOSCOPY (EGD);  Surgeon: Wilford Corner, MD;  Location: [facility] ENDOSCOPY;  Service: Endoscopy;  Laterality: N/A;  . ORIF ANKLE FRACTURE  05/12/2012   Procedure: OPEN REDUCTION INTERNAL FIXATION (ORIF) ANKLE FRACTURE;  Surgeon: Wylene Simmer, MD;  Location: [place];  Service: Orthopedics;  Laterality: Left;  . ORIF WRIST FRACTURE  05/12/2012   Procedure: OPEN REDUCTION INTERNAL FIXATION (ORIF) WRIST FRACTURE;  Surgeon: Mcarthur Rossetti, MD;  Location: [place];  Service: Orthopedics;  Laterality: Left;  . TUBAL LIGATION    . WRIST FRACTURE SURGERY  05/2012  :   Current Facility-Administered Medications:  .  0.9 % NaCl with KCl 20 mEq/ L  infusion, , Intravenous, Continuous, Ainsley Spinner, PA-C, Last Rate: 100 mL/hr at 12/22/16 1223 .  acetaminophen (TYLENOL) tablet 500 mg, 500 mg, Oral, Q6H PRN, 500 mg at 12/22/16 2206 **OR** acetaminophen (TYLENOL) suppository 325 mg, 325 mg, Rectal, Q6H PRN, Cherene Altes, MD .  ceFAZolin (ANCEF) IVPB 1 g/50 mL premix, 1 g, Intravenous, Q8H, Ainsley Spinner, PA-C, Last Rate: 100 mL/hr at 12/23/16 1730,  1 g at 12/23/16 1730 .  cholecalciferol (VITAMIN D) tablet 2,000 Units, 2,000 Units, Oral, BID, Ainsley Spinner, PA-C, 2,000 Units at 12/22/16 2206 .  cloNIDine (CATAPRES) tablet 0.1 mg, 0.1 mg, Oral, TID PRN, Cherene Altes, MD .  docusate sodium (COLACE) capsule 100 mg, 100 mg, Oral, BID, Ainsley Spinner, PA-C, 100 mg at 12/22/16 2206 .  enoxaparin (LOVENOX) injection 40 mg, 40 mg, Subcutaneous, Q24H, Altamese Crestone, MD .  feeding supplement (BOOST / RESOURCE BREEZE) liquid 1 Container, 1 Container, Oral, BID BM, Ainsley Spinner, PA-C, 1 Container at 12/22/16 1202 .  folic acid (FOLVITE)  tablet 1 mg, 1 mg, Oral, Daily, Ainsley Spinner, PA-C, 1 mg at 12/22/16 1141 .  hydrALAZINE (APRESOLINE) injection 10 mg, 10 mg, Intravenous, Q6H PRN, Cherene Altes, MD .  lactated ringers infusion, , Intravenous, Continuous, Nolon Nations, MD, Last Rate: 10 mL/hr at 12/23/16 0930 .  MEDLINE mouth rinse, 15 mL, Mouth Rinse, BID, Altamese [place], MD, 15 mL at 12/22/16 2200 .  methocarbamol (ROBAXIN) tablet 500 mg, 500 mg, Oral, Q6H PRN **OR** methocarbamol (ROBAXIN) 500 mg in dextrose 5 % 50 mL IVPB, 500 mg, Intravenous, Q6H PRN, Ainsley Spinner, PA-C .  metoprolol tartrate (LOPRESSOR) tablet 12.5 mg, 12.5 mg, Oral, BID, Cherene Altes, MD, 12.5 mg at 12/23/16 0856 .  morphine 4 MG/ML injection 1-2 mg, 1-2 mg, Intravenous, Q2H PRN, Ainsley Spinner, PA-C .  multivitamin with minerals tablet 1 tablet, 1 tablet, Oral, Daily, Ainsley Spinner, PA-C, 1 tablet at 12/22/16 1141 .  oxyCODONE (Oxy IR/ROXICODONE) immediate release tablet 2.5-5 mg, 2.5-5 mg, Oral, Q6H PRN, Cherene Altes, MD, 5 mg at 12/21/16 2125 .  pantoprazole (PROTONIX) EC tablet 40 mg, 40 mg, Oral, BID AC, Ainsley Spinner, PA-C, 40 mg at 12/23/16 1710 .  thiamine (VITAMIN B-1) tablet 100 mg, 100 mg, Oral, Daily, 100 mg at 12/22/16 1140 **OR** [DISCONTINUED] thiamine (B-1) injection 100 mg, 100 mg, Intravenous, Daily, Ainsley Spinner, PA-C:  . cholecalciferol  2,000 Units Oral BID  . docusate sodium  100 mg Oral BID  . enoxaparin (LOVENOX) injection  40 mg Subcutaneous Q24H  . feeding supplement  1 Container Oral BID BM  . folic acid  1 mg Oral Daily  . mouth rinse  15 mL Mouth Rinse BID  . metoprolol tartrate  12.5 mg Oral BID  . multivitamin with minerals  1 tablet Oral Daily  . pantoprazole  40 mg Oral BID AC  . thiamine  100 mg Oral Daily  :  Allergies  Allergen Reactions  . Ativan [Lorazepam] Other (See Comments)    Makes confused  . No Known Allergies   :  Family History  Problem Relation Age of Onset  . Hypertension Mother    :  Social History   Social History  . Marital status: Married    Spouse name: N/A  . Number of children: N/A  . Years of education: N/A   Occupational History  . Not on file.   Social History Main Topics  . Smoking status: Current Every Day Smoker    Packs/day: 2.00    Years: 30.00    Types: Cigarettes  . Smokeless tobacco: Never Used     Comment: smoking cessation material refused   . Alcohol use Yes     Comment: 6 beers per day  . Drug use: No  . Sexual activity: Not Currently   Other Topics Concern  . Not on file   Social History Narrative  . No  narrative on file  :  Pertinent items are noted in HPI.  Exam: Patient Vitals for the past 24 hrs:  BP Temp Temp src Pulse Resp SpO2  12/23/16 1411 123/74 97.3 F (36.3 C) - 95 (!) 26 100 %  12/23/16 1400 123/74 - - (!) 101 (!) 28 98 %  12/23/16 1345 (!) 144/80 97.5 F (36.4 C) - (!) 108 (!) 22 95 %  12/23/16 1330 (!) 141/75 - - (!) 109 (!) 24 95 %  12/23/16 1318 (!) 171/83 97 F (36.1 C) - (!) 114 (!) 28 (!) 88 %  12/23/16 0400 133/63 99.1 F (37.3 C) Oral 88 16 97 %  12/22/16 2051 128/64 99.4 F (37.4 C) Oral 100 18 96 %    As above area and she does have a firm indurated mass in the left breast at about the 1:00 position. This is not bleeding. It is nontender. It is not mobile.    Recent Labs  12/22/16 0458 12/23/16 0355  WBC 7.0 7.3  HGB 13.4 13.5  HCT 42.9 42.9  PLT 211 257    Recent Labs  12/22/16 0458 12/23/16 0355  NA 143 142  K 4.8 4.5  CL 106 103  CO2 28 27  GLUCOSE 141* 110*  BUN 11 11  CREATININE 0.54 0.56  CALCIUM 9.3 9.1    Blood smear review:  None  Pathology: None     Assessment and Plan:  Ms. Brandle is a 66 year old white female with a pathologic fracture of the right she murmurs. She has a indurated mass in the left breast. One would have to think that this is metastatic breast cancer.  It really would be nice to get a biopsy of this breast mass while she is in the  hospital. Unfortunately, I doubt this will happen because breast masses, for some reason, are not biopsied as an inpatient. It probably would be very difficult to get her to the breast center as an outpatient. Maybe if she goes to a rehabilitation or skilled nursing center, they can get her there.  Given her age, I would think that if we are dealing with breast cancer, that this is estrogen positive.  I will give her a dose of Zometa since she is in the hospital. I think this might be helpful.  I might as well try to get CAT scans and bone scan. A bone survey really is not that helpful for blastic metastasis, which breast-cancer produces.  She is very nice. I had a nice long talk with her. She has 3 children.  I am most worried about her dogs. Hopefully, her children will take care of the dogs while she is rehabilitating.  She probably will need radiation therapy to the right arm. Please consult radiation oncology so that they can see her.  I will send off a CA 27.29.  Depending on what we find with our CT scans, we might be oh to get a biopsy of a liver lesion.  I spent about an hour with her. Again she is very nice. It was a pleasure to see her. She does have a strong faith.  Lattie Haw, MD  Darlyn Chamber 33:6

## 2016-12-24 ENCOUNTER — Encounter (HOSPITAL_COMMUNITY): Payer: Self-pay

## 2016-12-24 ENCOUNTER — Inpatient Hospital Stay (HOSPITAL_COMMUNITY): Payer: Medicare HMO

## 2016-12-24 DIAGNOSIS — N649 Disorder of breast, unspecified: Secondary | ICD-10-CM

## 2016-12-24 LAB — BASIC METABOLIC PANEL
Anion gap: 9 (ref 5–15)
BUN: 9 mg/dL (ref 6–20)
CALCIUM: 8.6 mg/dL — AB (ref 8.9–10.3)
CO2: 28 mmol/L (ref 22–32)
CREATININE: 0.58 mg/dL (ref 0.44–1.00)
Chloride: 99 mmol/L — ABNORMAL LOW (ref 101–111)
GFR calc Af Amer: >60 mL/min (ref >60)
GLUCOSE: 113 mg/dL — AB (ref 65–99)
POTASSIUM: 4.1 mmol/L (ref 3.5–5.1)
SODIUM: 136 mmol/L (ref 135–145)

## 2016-12-24 LAB — CBC
HCT: 39.2 % (ref 36.0–46.0)
Hemoglobin: 12.5 g/dL (ref 12.0–15.0)
MCH: 34.3 pg — ABNORMAL HIGH (ref 26.0–34.0)
MCHC: 31.9 g/dL (ref 30.0–36.0)
MCV: 107.7 fL — ABNORMAL HIGH (ref 78.0–100.0)
PLATELETS: 267 10*3/uL (ref 150–400)
RBC: 3.64 MIL/uL — ABNORMAL LOW (ref 3.87–5.11)
RDW: 14.8 % (ref 11.5–15.5)
WBC: 8 10*3/uL (ref 4.0–10.5)

## 2016-12-24 LAB — MAGNESIUM: MAGNESIUM: 1.8 mg/dL (ref 1.7–2.4)

## 2016-12-24 LAB — CANCER ANTIGEN 27.29: CA 27.29: 23.9 U/mL (ref 0.0–38.6)

## 2016-12-24 LAB — AFP TUMOR MARKER: AFP TUMOR MARKER: 4.7 ng/mL (ref 0.0–8.3)

## 2016-12-24 MED ORDER — BISACODYL 10 MG RE SUPP: 10.0000 mg | Freq: Every day | RECTAL | Status: DC | PRN

## 2016-12-24 MED ORDER — SENNOSIDES-DOCUSATE SODIUM 8.6-50 MG PO TABS
2.0000 | ORAL_TABLET | Freq: Every day | ORAL | Status: DC
  Administered 2016-12-24 – 2016-12-27 (×4): 2 via ORAL
  Filled 2016-12-24 (×4): qty 2

## 2016-12-24 MED ORDER — IOPAMIDOL (ISOVUE-300) INJECTION 61%
INTRAVENOUS | Status: AC
  Administered 2016-12-24: 100 mL
  Filled 2016-12-24: qty 100

## 2016-12-24 MED ORDER — POLYETHYLENE GLYCOL 3350 17 G PO PACK
17.0000 g | PACK | Freq: Every day | ORAL | Status: DC
  Administered 2016-12-24 – 2016-12-28 (×4): 17 g via ORAL
  Filled 2016-12-24 (×5): qty 1

## 2016-12-24 MED ORDER — IOPAMIDOL (ISOVUE-300) INJECTION 61%
INTRAVENOUS | Status: AC
  Filled 2016-12-24: qty 30

## 2016-12-24 NOTE — Evaluation (Signed)
Occupational Therapy Evaluation Patient Details Name: Taylor Brown MRN: [id number] DOB: [birth year] Today's Date: 12/24/2016    History of Present Illness Pt is a 66 y.o. female presenting with R distal humerus fracture with acute radial nerve injury s/p fall. During hospital admission pt was found to have antral gastritis, liver lesions, and L breast mass with possible metastatic breast cancer. Pt is now s/p ORIF R distal humerus fracture and exploration of radial nerve on 6/15. PMHx: Arthritis, GERD, ORIF L ankle fx, ORIF L wrist fx.   Clinical Impression   Pt reports she was managing ADL with mod I PTA and was performing functional mobility with occasional use of walker. Currently pt able to perform stand pivot with mod assist and requires mod-max assist for ADL. Pt presenting with pain, decreased balance, generalized weakness, deconditioning, decreased activity tolerance impacting her independence and safety with ADL and functional mobility. Recommending SNF for follow up to maximize independence and safety with ADL and functional mobility prior to return home alone. Pt would benefit from continued skilled OT to address established goals.  OF NOTE: pt with R pre-fab wrist cock up splint donned. Performed skin check with no redness or signs of pressure noted. OT plan to follow up beginning of next week for fabrication of R radial nerve palsy splint; PA-C aware.     Follow Up Recommendations  SNF;Supervision/Assistance - 24 hour    Equipment Recommendations  Other (comment) (TBD at next venue)    Recommendations for Other Services PT consult     Precautions / Restrictions Precautions Precautions: Fall Precaution Comments: No active abduction R shoulder. Ok for passive abduction R shoulder. [place] for active and passive R shoulder flexion. Unrestricted ROM R elbow, forearm, wrist and hand. 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  Required Braces or Orthoses: Other  Brace/Splint Other Brace/Splint: R pre-fab wrist cock up splint Restrictions Weight Bearing Restrictions: Yes RUE Weight Bearing: Non weight bearing      Mobility Bed Mobility Overal bed mobility: Needs Assistance Bed Mobility: Supine to Sit     Supine to sit: Min assist;HOB elevated     General bed mobility comments: increased time, HOB elevated, with use of bed rails. Assist provided to support RUE and make sure she maintains NWB  Transfers Overall transfer level: Needs assistance Equipment used: 1 person hand held assist Transfers: Sit to/from [facility] Sit to Stand: Min assist Stand pivot transfers: Mod assist       General transfer comment: Assist to support RUE and for balance with sit to stand from EOB. Increased assist provided for pivot to chair.    Balance Overall balance assessment: Needs assistance Sitting-balance support: Feet supported;Single extremity supported Sitting balance-Leahy Scale: Fair     Standing balance support: Bilateral upper extremity supported Standing balance-Leahy Scale: Poor                             ADL either performed or assessed with clinical judgement   ADL Overall ADL's : Needs assistance/impaired   Eating/Feeding Details (indicate cue type and reason): pt reports she was able to self feed this AM with use of L hand Grooming: Moderate assistance;Sitting;Brushing hair   Upper Body Bathing: Moderate assistance;Sitting   Lower Body Bathing: Maximal assistance;Sit to/from stand   Upper Body Dressing : Moderate assistance;Sitting   Lower Body Dressing: Maximal assistance;Sit to/from stand   Toilet Transfer: Moderate assistance;Stand-pivot (HHA) Armed forces technical officer  Details (indicate cue type and reason): Simulated by stand pivot from EOB to chair         Functional mobility during ADLs: Moderate assistance (HHA, for stand pivot only) General ADL Comments: Discussed brace wear and skin checks; pt  without areas or redness or pressure noted.     Vision Baseline Vision/History: Wears glasses Wears Glasses: At all times       Perception     Praxis      Pertinent Vitals/Pain Pain Assessment: Faces Faces Pain Scale: Hurts little more Pain Location: RUE, mainly elbow area Pain Descriptors / Indicators: Aching;Grimacing;Guarding Pain Intervention(s): Monitored during session;Limited activity within patient's tolerance;Repositioned;Premedicated before session     Hand Dominance Right   Extremity/Trunk Assessment Upper Extremity Assessment Upper Extremity Assessment: RUE deficits/detail RUE Deficits / Details: pt with active digit flexion but no extension, active wrist flexion but no extension. PROM overall WFL. Pt reports sensation intact?   Lower Extremity Assessment Lower Extremity Assessment: Defer to PT evaluation   Cervical / Trunk Assessment Cervical / Trunk Assessment: Kyphotic   Communication Communication Communication: No difficulties   Cognition Arousal/Alertness: Awake/alert Behavior During Therapy: WFL for tasks assessed/performed Overall Cognitive Status: Within Functional Limits for tasks assessed                                     General Comments  DOE 2/4, SpO2 96% on 3L via [place]    Exercises     Shoulder Instructions      Home Living Family/patient expects to be discharged to:: Private residence Living Arrangements: Alone   Type of Home: Mobile home Home Access: Stairs to enter [employer] of Steps: 2   Home Layout: One level     Bathroom Shower/Tub: Corporate investment banker: Standard     Home Equipment: Walker - standard;Other (comment) (uses lawn chair as shower chair)          Prior Functioning/Environment Level of Independence: Independent with assistive device(s)        Comments: intermittent use of walker, reports she is independent with ADL        OT Problem List:  Decreased strength;Decreased range of motion;Decreased activity tolerance;Impaired balance (sitting and/or standing);Decreased safety awareness;Decreased knowledge of use of DME or AE;Decreased knowledge of precautions;Cardiopulmonary status limiting activity;Impaired sensation;Impaired tone;Obesity;Impaired UE functional use;Pain;Increased edema      OT Treatment/Interventions: Self-care/ADL training;Energy conservation;DME and/or AE instruction;Therapeutic exercise;Neuromuscular education;Splinting;Therapeutic activities;Patient/family education;Balance training    OT Goals(Current goals can be found in the care plan section) Acute Rehab OT Goals Patient Stated Goal: to get better OT Goal Formulation: With patient Time For Goal Achievement: 01/07/17 Potential to Achieve Goals: Good ADL Goals Pt Will Perform Grooming: with min guard assist;sitting Pt Will Transfer to Toilet: with min assist;ambulating;bedside commode (over toilet) Pt Will Perform Toileting - Clothing Manipulation and hygiene: with min assist;sit to/from stand Pt/caregiver will Perform Home Exercise Program: Increased strength;Right Upper extremity;With written HEP provided;Independently (self ROM techniques)  OT Frequency: Min 2X/week   Barriers to D/C: Decreased caregiver support  pt lives alone       Co-evaluation              AM-PAC PT "6 Clicks" Daily Activity     Outcome Measure Help from another person eating meals?: A Little Help from another person taking care of personal grooming?: A Lot Help from another person toileting, which includes using  toliet, bedpan, or urinal?: A Lot Help from another person bathing (including washing, rinsing, drying)?: A Lot Help from another person to put on and taking off regular upper body clothing?: A Lot Help from another person to put on and taking off regular lower body clothing?: A Lot 6 Click Score: 13   End of Session Equipment Utilized During Treatment: Gait  belt;Oxygen Nurse Communication: Mobility status;Weight bearing status;Precautions  Activity Tolerance: Patient tolerated treatment well Patient left: in chair;with call bell/phone within reach;with family/visitor present  OT Visit Diagnosis: Unsteadiness on feet (R26.81);Other abnormalities of gait and mobility (R26.89);History of falling (Z91.81);Muscle weakness (generalized) (M62.81);Pain Pain - Right/Left: Right Pain - part of body: Arm                Time: 6060-0459 OT Time Calculation (min): 26 min Charges:  OT General Charges $OT Visit: 1 Procedure OT Evaluation $OT Eval Moderate Complexity: 1 Procedure OT Treatments $Self Care/Home Management : 8-22 mins G-Codes:     Mel Almond A. Ulice Brilliant, M.S., OTR/L Pager: [place] 12/24/2016, 11:01 AM

## 2016-12-24 NOTE — Progress Notes (Signed)
PROGRESS NOTE        PATIENT DETAILS Name: Taylor Brown Age: 66 y.o. Sex: female Date of Birth: 04-09-1951 Admit Date: 12/19/2016 Admitting Physician Altamese [place], MD [id number], [name]  Brief Narrative: Patient is a 66 y.o. female with long-standing history of tobacco and alcohol abuse -last seen by a primary M.D. more than 50 years back, transferred from [facility] to the orthopedic service for evaluation of right supracondylar distal humerus fracture with acute radial nerve palsy. During hospital course, upon further evaluation, found to have possible left breast mass metastatic disease. Hospitalist service was consulted for management of multiple medical issues. See below for further details  Subjective: Awake and alert-sitting at [place] at bedside. Daughter at bedside as well.     Assessment/Plan: Right supracondylar humerus fracture with acute radial palsy: Underwent ORIF on 6/15, further management deferred to the primary service.   Acute toxic metabolic encephalopathy: Resolved, she is awake and alert. Etiology felt to be multifactorial-alcohol withdrawal and medication effect.   Alcohol withdrawal: Seems to have resolved-awake/alert, no tremors. Counseled regarding importance of completely stopping alcohol use. Her last drink was on 6/9.   Antral gastritis-EtOH an NSAID-induced: Underwent EGD at [facility], remarkable for antral gastritis-continued PPI. No evidence of bleeding at this time.   Left breast mass/multiple liver lesions: Highly suspicious for metastatic breast cancer. Evaluated by oncology-awaiting CT chest/CT abdomen. Will need biopsy at some point in time.  Tobacco abuse: Counseled-not sure if she has any desire to quit at this time. We will continue to counsel.   Macrocytosis: Likely secondary to alcohol use-folate levels within normal limits, vitamin B12 levels borderline low-await methylmalonic levels-if increased, will begin vitamin B12  supplementation.   Hypertension: Controlled-continue with metoprolol.  Mildly elevated TSH: Likely subclinical hypothyroidism-no indication for supplementation-stable for outpatient follow-up.  Echo (reviewed):EF 65-70% on TTE done on 6/12  DVT Prophylaxis: Prophylactic Lovenox   Code Status: Full code   Disposition Plan: Defer to primary team  Antimicrobial agents: Anti-infectives    Start     Dose/Rate Route Frequency Ordered Stop   12/23/16 1700  ceFAZolin (ANCEF) IVPB 1 g/50 mL premix     1 g 100 mL/hr over 30 Minutes Intravenous Every 8 hours 12/23/16 1421 12/24/16 1041   12/23/16 1000  ceFAZolin (ANCEF) IVPB 2g/100 mL premix     2 g 200 mL/hr over 30 Minutes Intravenous  Once 12/22/16 0956 12/23/16 1003   12/20/16 0600  ceFAZolin (ANCEF) IVPB 2g/100 mL premix  Status:  Discontinued     2 g 200 mL/hr over 30 Minutes Intravenous To Short Stay 12/19/16 1611 12/21/16 0600      Time spent: 25- minutes-Greater than 50% of this time was spent in counseling, explanation of diagnosis, planning of further management, and coordination of care.  MEDICATIONS: Scheduled Meds: . cholecalciferol  2,000 Units Oral BID  . enoxaparin (LOVENOX) injection  40 mg Subcutaneous Q24H  . feeding supplement  1 Container Oral BID BM  . folic acid  1 mg Oral Daily  . iopamidol      . mouth rinse  15 mL Mouth Rinse BID  . metoprolol tartrate  12.5 mg Oral BID  . multivitamin with minerals  1 tablet Oral Daily  . pantoprazole  40 mg Oral BID AC  . polyethylene glycol  17 g Oral Daily  . senna-docusate  2 tablet Oral QHS  . thiamine  100 mg Oral Daily   Continuous Infusions: . lactated ringers 10 mL/hr at 12/23/16 0930  . methocarbamol (ROBAXIN)  IV     PRN Meds:.acetaminophen **OR** acetaminophen, bisacodyl, cloNIDine, hydrALAZINE, ipratropium-albuterol, methocarbamol **OR** methocarbamol (ROBAXIN)  IV, morphine injection, oxyCODONE   PHYSICAL EXAM: Vital signs: Vitals:   12/23/16  2252 12/23/16 2254 12/24/16 0056 12/24/16 0516  BP: 105/64  (!) 103/57 (!) 114/56  Pulse: (!) 103  (!) 103 (!) 101  Resp:   16 16  Temp:  99.1 F (37.3 C) 99 F (37.2 C) 99 F (37.2 C)  TempSrc:  Oral Oral Oral  SpO2:   99% 97%   There were no vitals filed for this visit. There is no height or weight on file to calculate BMI.   General appearance :Awake, alert, not in any distress.  Eyes:, pupils equally reactive to light and accomodation,no scleral icterus. HEENT: Atraumatic and Normocephalic Neck: supple, no JVD. Resp:Good air entry bilateral CVS: S1 S2 regular, no murmurs.  GI: Bowel sounds present, Non tender and not distended with no gaurding, rigidity or rebound. Extremities: B/L Lower Ext shows no edema, both legs are warm to touch Neurology:  speech clear,Non focal, sensation is grossly intact. Psychiatric: Normal judgment and insight. Normal mood. Musculoskeletal:No digital cyanosis Skin:No Rash, warm and dry Wounds:N/A I have personally reviewed following labs and imaging studies  LABORATORY DATA: CBC:  Recent Labs Lab 12/20/16 0458 12/20/16 1001 12/21/16 0312 12/22/16 0458 12/23/16 0355 12/23/16 2337  WBC 7.9  --  7.6 7.0 7.3 8.0  NEUTROABS  --   --  5.1  --   --   --   HGB 13.6  --  13.8 13.4 13.5 12.5  HCT 42.2 38.8 42.5 42.9 42.9 39.2  MCV 107.1*  --  106.8* 110.3* 110.3* 107.7*  PLT 168  --  194 211 257 160    Basic Metabolic Panel:  Recent Labs Lab 12/18/16 1520 12/19/16 0428  12/20/16 1121 12/21/16 0312 12/22/16 0458 12/23/16 0355 12/23/16 1923 12/24/16 0716  NA  --  136  < >  --  139 143 142 137 136  K  --  3.5  < >  --  4.0 4.8 4.5 4.2 4.1  CL  --  98*  < >  --  104 106 103 101 99*  CO2  --  27  < >  --  _0 GLUCOSE  --  110*  < >  --  124* 141* 110* 135* 113*  BUN  --  7  < >  --  _1 CREATININE  --  0.46  < >  --  0.64 0.54 0.56 0.58 0.58  CALCIUM  --  8.4*  < >  --  8.5* 9.3 9.1 8.9 8.6*  MG 1.5* 1.5*  --   2.0  --  1.9  --   --  1.8  PHOS  --   --   --  2.8  --   --   --   --   --   < > = values in this interval not displayed.  GFR: Estimated Creatinine Clearance: 68.5 mL/min (by C-G formula based on SCr of 0.58 mg/dL).  Liver Function Tests:  Recent Labs Lab 12/20/16 0458 12/21/16 0312 12/22/16 0458 12/23/16 0355  AST 40 39 34 38  ALT 36 34 31 31  ALKPHOS 116 105 98 107  BILITOT  1.4* 1.0 0.9 0.9  PROT 6.0* 6.2* 6.0* 6.4*  ALBUMIN 2.7* 2.8* 2.8* 2.8*   No results for input(s): LIPASE, AMYLASE in the last 168 hours.  Recent Labs Lab 12/20/16 1001  AMMONIA 18    Coagulation Profile:  Recent Labs Lab 12/21/16 0312  INR 1.00    Cardiac Enzymes: No results for input(s): CKTOTAL, CKMB, CKMBINDEX, TROPONINI in the last 168 hours.  BNP (last 3 results) No results for input(s): PROBNP in the last 8760 hours.  HbA1C: No results for input(s): HGBA1C in the last 72 hours.  CBG:  Recent Labs Lab 12/20/16 1214 12/21/16 0831  GLUCAP 103* 138*    Lipid Profile: No results for input(s): CHOL, HDL, LDLCALC, TRIG, CHOLHDL, LDLDIRECT in the last 72 hours.  Thyroid Function Tests: No results for input(s): TSH, T4TOTAL, FREET4, T3FREE, THYROIDAB in the last 72 hours.  Anemia Panel:  Recent Labs  12/22/16 0458  VITAMINB12 275  FOLATE 15.5  FERRITIN 109  TIBC 346  IRON 42  RETICCTPCT 4.0*    Urine analysis:    Component Value Date/Time   COLORURINE YELLOW 12/22/2016 2210   APPEARANCEUR HAZY (A) 12/22/2016 2210   LABSPEC 1.016 12/22/2016 2210   PHURINE 5.0 12/22/2016 2210   GLUCOSEU NEGATIVE 12/22/2016 2210   HGBUR MODERATE (A) 12/22/2016 2210   BILIRUBINUR NEGATIVE 12/22/2016 2210   KETONESUR NEGATIVE 12/22/2016 2210   PROTEINUR 30 (A) 12/22/2016 2210   UROBILINOGEN 0.2 05/13/2012 1440   NITRITE POSITIVE (A) 12/22/2016 2210   LEUKOCYTESUR LARGE (A) 12/22/2016 2210    Sepsis Labs: Lactic Acid, Venous No results found for:  LATICACIDVEN  MICROBIOLOGY: Recent Results (from the past 240 hour(s))  MRSA PCR Screening     Status: None   Collection Time: 12/19/16  5:29 PM  Result Value Ref Range Status   MRSA by PCR NEGATIVE NEGATIVE Final    Comment:        The GeneXpert MRSA Assay (FDA approved for NASAL specimens only), is one component of a comprehensive MRSA colonization surveillance program. It is not intended to diagnose MRSA infection nor to guide or monitor treatment for MRSA infections.   Culture, Urine     Status: Abnormal   Collection Time: 12/20/16 11:10 AM  Result Value Ref Range Status   Specimen Description URINE, RANDOM  Final   Special Requests NONE  Final   Culture <10,000 COLONIES/mL INSIGNIFICANT GROWTH (A)  Final   Report Status 12/21/2016 FINAL  Final  Surgical pcr screen     Status: None   Collection Time: 12/22/16  9:09 PM  Result Value Ref Range Status   MRSA, PCR NEGATIVE NEGATIVE Final   Staphylococcus aureus NEGATIVE NEGATIVE Final    Comment:        The Xpert SA Assay (FDA approved for NASAL specimens in patients over 42 years of age), is one component of a comprehensive surveillance program.  Test performance has been validated by [facility] for patients greater than or equal to 65 year old. It is not intended to diagnose infection nor to guide or monitor treatment.     RADIOLOGY STUDIES/RESULTS: Dg Forearm Right  Result Date: 12/20/2016 CLINICAL DATA:  Right arm fracture EXAM: RIGHT FOREARM - 2 VIEW COMPARISON:  None. FINDINGS: No fracture or dislocation is seen in the forearm. Distal humeral fracture may be visible on one of the two views, but is better demonstrated on the prior study. Overlying cast obscures fine osseous detail. IMPRESSION: No fracture or dislocation is seen in  the forearm. Known distal humerus fracture is not well visualized. Electronically Signed   By: Julian Hy M.D.   On: 12/20/2016 11:19   Ct Angio Chest Pe W Or Wo  Contrast  Result Date: 12/17/2016 CLINICAL DATA:  Shortness of Breath EXAM: CT ANGIOGRAPHY CHEST WITH CONTRAST TECHNIQUE: Multidetector CT imaging of the chest was performed using the standard protocol during bolus administration of intravenous contrast. Multiplanar CT image reconstructions and MIPs were obtained to evaluate the vascular anatomy. CONTRAST:  75 mL Isovue 370 nonionic COMPARISON:  Chest radiograph December 17, 2016 FINDINGS: Cardiovascular: There is no demonstrable pulmonary embolus. There is no thoracic aortic aneurysm or dissection. Main pulmonary outflow tract measures 3.1 cm in length. There is mild calcification at the origin of the left subclavian artery. Other visualized great vessels appear unremarkable. Pericardium is not appreciably thickened. There is left ventricular hypertrophy. Mediastinum/Nodes: Visualized thyroid appears unremarkable. There is no appreciable thoracic adenopathy. Lungs/Pleura: There is mild bibasilar atelectasis. There is no parenchymal lung edema or consolidation. No pleural effusion or pleural thickening evident. Upper Abdomen: There is diffuse hepatic steatosis. There is an enhancing focus in the anterior segment of the right lobe of the liver measuring 1.7 x 1.7 cm. A similar appearing lesion is noted near the dome of the liver in the medial segment left lobe measuring 1.5 x 1.3 cm. No other focal liver lesions are apparent ; note that liver is incompletely visualized. Visualized upper abdominal structures otherwise appear unremarkable. Musculoskeletal: There is degenerative change in the thoracic spine. There are no blastic or lytic bone lesions. There are several healed rib fractures on the left. Review of the MIP images confirms the above findings. IMPRESSION: No demonstrable pulmonary embolus. Prominence of the main pulmonary outflow tract suggests a degree of pulmonary arterial hypertension. No adenopathy. No lung edema or consolidation.  Bibasilar atelectasis  noted. Enhancing lesions in the liver near the dome noted. Etiology uncertain. This finding warrants nonemergent pre and serial post-contrast CT or MR of the liver to further evaluate. Old healed rib fractures on the left. Left ventricular hypertrophy. Electronically Signed   By: Lowella Grip III M.D.   On: 12/17/2016 13:51   Dg Chest Port 1 View  Result Date: 12/23/2016 CLINICAL DATA:  Shortness of breath EXAM: PORTABLE CHEST 1 VIEW COMPARISON:  12/17/2016 FINDINGS: Linear atelectasis at the medial left base. Cannot exclude small left pleural effusion. Mild cardiomegaly with atherosclerosis. No overt pulmonary edema. No pneumothorax. Old left rib fracture. IMPRESSION: 1. Cardiomegaly without edema or infiltrate 2. Subsegmental atelectasis at the left lung base. Electronically Signed   By: Donavan Foil M.D.   On: 12/23/2016 19:08   Dg Chest Portable 1 View  Result Date: 12/17/2016 CLINICAL DATA:  Shortness of breath and tachycardia EXAM: PORTABLE CHEST 1 VIEW COMPARISON:  05/13/2012 FINDINGS: Cardiomegaly noted. There is no evidence of focal airspace disease, pulmonary edema, suspicious pulmonary nodule/mass, pleural effusion, or pneumothorax. No acute bony abnormalities are identified. IMPRESSION: Cardiomegaly without evidence of acute cardiopulmonary disease. Electronically Signed   By: Margarette Canada M.D.   On: 12/17/2016 12:11   Dg Bone Survey Met  Result Date: 12/23/2016 CLINICAL DATA:  (Breast lesion. Pathologic fracture of the right humerus. Status post ORIF of the right humerus. Evaluate for metastasis. EXAM: METASTATIC BONE SURVEY COMPARISON:  CXR and right humerus radiographs from 12/17/2016, left ankle radiographs from 05/11/2012, left wrist radiographs small 05/11/2012 FINDINGS: Lateral skull: Occipital lytic skull lucency measuring 13 x 8 mm. No fracture. Both shoulders:  Negative for lytic or blastic disease. No acute fracture nor dislocation. Left old sixth and seventh rib fractures are  incidentally included. Both humeri: Pathologic fracture through moth eaten lytic lesion of the distal right humeral diaphysis transfixed by plate and screws. Lesion at the junction of the middle and distal third of the humeral shaft. Left humerus is negative. Radius and ulna: Negative for lytic or blastic disease. Volar plate and screw fixation across distal left radius fracture. Cervical spine AP and lateral: Degenerative disc and facet arthropathy. No suspicious osseous abnormality. Thoracic spine: Multilevel degenerative disc and endplate changes consistent with spondylosis. Lumbar spine: Chronic appearing superior endplate compressions of L1, L3 and L4 without retropulsion. No lytic or blastic disease. Lower lumbar facet arthropathy from L3 through S1. Slight disc space narrowing at L5-S1. PA chest: Normal size heart. Aortic atherosclerosis. Atelectasis at the lung bases. No dominant mass. Elevation of the right hemidiaphragm versus eventration. Old left-sided rib fractures. AP pelvis: Lower lumbar facet arthropathy. Intact SI joints and pubic symphysis with osteoarthritis of the pubic symphysis. Old right inferior pubic ramus fracture. No suspicious lytic or blastic disease of the pelvis and included hips. Both femora, tibia and fibula: Negative for lytic or blastic disease. ORIF of left bimalleolar fractures. IMPRESSION: 13 x 8 mm lytic focus involving the occiput of the skull which could be a normal variant such is an arachnoid granulation. CT may help for further correlation. No additional lytic or blastic disease are apparent. Pathologic fracture transfixed by plate and screws involving the distal right humeral shaft. ORIF of the distal left radius and left bimalleolar fractures are also noted. Cervical, thoracic and lumbar spondylosis. Chronic benign-appearing endplate compressions of L1, L3 and L4. Electronically Signed   By: Ashley Royalty M.D.   On: 12/23/2016 17:11   Dg Humerus Right  Result Date:  12/23/2016 CLINICAL DATA:  OPEN REDUCTION INTERNAL FIXATION (ORIF) DISTAL HUMERUS FRACTURE (Right Arm Upper). Radiation Safety Timeout performed by BIW. Fluoro time 12 seconds. EXAM: DG C-ARM 61-120 MIN; RIGHT HUMERUS - 2+ VIEW COMPARISON:  12/20/2016 FINDINGS: Patient has undergone screw plate fixation of right humerus fracture. A cortical screw traverses the fracture site. There is near anatomic alignment. Elbow is unremarkable in appearance. IMPRESSION: Status post ORIF of the right humerus. Lucency at the fracture site, raising the question of pathologic fracture. Correlation with history is recommended. Electronically Signed   By: Nolon Nations M.D.   On: 12/23/2016 13:53   Dg Humerus Right  Result Date: 12/17/2016 CLINICAL DATA:  Acute onset pain while lifting heavy object EXAM: RIGHT HUMERUS - 2+ VIEW COMPARISON:  None. FINDINGS: Frontal and lateral views were obtained. There is an obliquely oriented fracture of the distal humerus with medial angulation and anterior displacement of the distal fracture fragment with respect proximal fragment. No other fractures. No dislocations. No abnormal periosteal reaction or appreciable arthropathy. IMPRESSION: Obliquely oriented fracture distal humerus with displacement angulation of fracture fragments. No dislocation. No abnormal periosteal reaction. No appreciable arthropathic change. Electronically Signed   By: Lowella Grip III M.D.   On: 12/17/2016 11:43   Dg Hand Complete Left  Result Date: 12/20/2016 CLINICAL DATA:  Left hand swelling EXAM: LEFT HAND - COMPLETE 3+ VIEW COMPARISON:  None. FINDINGS: No evidence of acute fracture or dislocation. Status post ORIF of the distal radius. Moderate degenerative changes at the 1st carpometacarpal joint. The visualized soft tissues are unremarkable. IMPRESSION: No acute osseus abnormality is seen. Status post ORIF of the distal radius. Moderate degenerative  changes of the 1st carpometacarpal joint.  Electronically Signed   By: Julian Hy M.D.   On: 12/20/2016 11:10   Dg C-arm 1-60 Min  Result Date: 12/23/2016 CLINICAL DATA:  OPEN REDUCTION INTERNAL FIXATION (ORIF) DISTAL HUMERUS FRACTURE (Right Arm Upper). Radiation Safety Timeout performed by BIW. Fluoro time 12 seconds. EXAM: DG C-ARM 61-120 MIN; RIGHT HUMERUS - 2+ VIEW COMPARISON:  12/20/2016 FINDINGS: Patient has undergone screw plate fixation of right humerus fracture. A cortical screw traverses the fracture site. There is near anatomic alignment. Elbow is unremarkable in appearance. IMPRESSION: Status post ORIF of the right humerus. Lucency at the fracture site, raising the question of pathologic fracture. Correlation with history is recommended. Electronically Signed   By: Nolon Nations M.D.   On: 12/23/2016 13:53     LOS: 5 days   Oren Binet, MD  [facility] Pager:[phone]  If 7PM-7AM, please contact night-coverage [email or web address] Password [email or web address] 12/24/2016, 1:49 PM

## 2016-12-24 NOTE — Progress Notes (Signed)
  Speech Language Pathology Treatment: Dysphagia  Patient Details Name: Taylor Brown MRN: [id number] DOB: [birth year] Today's Date: 12/24/2016 Time: 1218-1229 SLP Time Calculation (min) (ACUTE ONLY): 11 min  Assessment / Plan / Recommendation Clinical Impression  Patient seen for dysphagia treatment. Pt alert, upright in chair with good improvement in MS today. MD advanced to regular diet with thin liquids, pt and RN report she has been tolerating. Assessed tolerance of upgraded textures with regular solids, thin liquids and purees. Pt presents with functional swallow and appearance of adequate airway protection with all consistencies, even with repeated challenging of thin liquids via straw. At this time no further acute needs identified; recommend continuing regular diet with thin liquids, medications whole with liquid. No further f/u with SLP recommended, will s/o.     HPI HPI: 66 y.o.femalewho has not been seen by a PCP in greater than 15 years, admitted initially to [facility] after sustaining a R humeral fracture, transferred to [facility] due to complications with radial nerve injury requiring complex surgery planned for 6/14.  While being admitted she reported GIB, in the setting of heavy NSAID use and ETOH (6 beers a day and Vodka). GI consulted, underwent EGD - remarkable for antral gastritis. Dx acute encephalopathy in setting of alcohol withdrawal.  On clear liquids due to GIB.       SLP Plan  Discharge SLP treatment due to (comment) (pt tolerating regular diet/thin liquids)       Recommendations  Diet recommendations: Regular;Thin liquid Liquids provided via: Cup;Straw Medication Administration: Whole meds with liquid Supervision: Patient able to self feed Postural Changes and/or Swallow Maneuvers: Seated upright 90 degrees                Oral Care Recommendations: Oral care BID Follow up Recommendations: None SLP Visit Diagnosis: Dysphagia, unspecified (R13.10) Plan:  Discharge SLP treatment due to (comment) (pt tolerating regular diet/thin liquids)       GO               [name], [name], [name]  [name] 12/24/2016, 12:58 PM

## 2016-12-24 NOTE — Progress Notes (Signed)
OT SPLINT Cancellation Note  Patient Details Name: Taylor Brown MRN: [id number] DOB: 1951-01-08   Cancelled Treatment:    Reason Eval/Treat Not Completed: Other (comment). Acknowledge OT splint order for R radial nerve palsy splint. Pt currently with pre-fab R wrist cock up splint donned from ortho tech. Spoke with PA-C regarding fabrication of R radial nerve palsy splint and will follow up at the beginning of next week.   Binnie Kand M.S., OTR/L Pager: [phone]  12/24/2016, 10:40 AM

## 2016-12-24 NOTE — Progress Notes (Signed)
Orthopaedic Trauma Service (OTS)  1 Day Post-Op Procedure(s) (LRB): OPEN REDUCTION INTERNAL FIXATION (ORIF) DISTAL HUMERUS FRACTURE (Right) TOENAIL TRIMMING (Bilateral)  Subjective: Patient reports pain as moderate.  Wants to return to her home and dogs.  Outside the room, family reports that her home is like an episode of "Hoarders" and would not be suitable to home health services---"it would not be feasible." Family only counted 10 Yorkies not 12 reported.   Objective: Current Vitals Blood pressure (!) 114/56, pulse (!) 101, temperature 99 F (37.2 C), temperature source Oral, resp. rate 16, SpO2 97 %. Vital signs in last 24 hours: Temp:  [97 F (36.1 C)-100.7 F (38.2 C)] 99 F (37.2 C) (06/16 0516) Pulse Rate:  [95-114] 101 (06/16 0516) Resp:  [16-28] 16 (06/16 0516) BP: (101-171)/(56-83) 114/56 (06/16 0516) SpO2:  [88 %-100 %] 97 % (06/16 0516)  Intake/Output from previous day: 06/15 0701 - 06/16 0700 In: 3071.7 [P.O.:240; I.V.:2831.7] Out: 1925 [Urine:1875; Blood:50]  LABS  Recent Labs  12/22/16 0458 12/23/16 0355 12/23/16 2337  HGB 13.4 13.5 12.5    Recent Labs  12/23/16 0355 12/23/16 2337  WBC 7.3 8.0  RBC 3.89 3.64*  HCT 42.9 39.2  PLT 257 267    Recent Labs  12/23/16 1923 12/24/16 0716  NA 137 136  K 4.2 4.1  CL 101 99*  CO2 27 28  BUN 9 9  CREATININE 0.58 0.58  GLUCOSE 135* 113*  CALCIUM 8.9 8.6*   No results for input(s): LABPT, INR in the last 72 hours. Skeletal survey did not identify lesions that would require Orthopaedic intervention prior to discharge; one lesion in the occiput 3mm x 52mm.  Physical Exam RUE Splint in place  Sens  Ax/M/U intact; no radial   Mot   Ax/ M/ AIN/ U intact; no radial   Brisk CR, Rad 2+  Assessment/Plan: 1 Day Post-Op Procedure(s) (LRB): OPEN REDUCTION INTERNAL FIXATION (ORIF) DISTAL HUMERUS FRACTURE (Right) TOENAIL TRIMMING (Bilateral)   In my opinion patient is unsafe to go home and would not  be expected to maintain outpatient work up appts; she requires SNF or rehab placement  Appreciate Dr. Vernona Rieger consultation; unclear whether he will contact surgeon for biopsy or would like ortho to--liver vs breast?  CT scans and bone scan per onc We will consult rad onc at his direction Daughter is willing to take her into her home in [place].  1. PT/OT for balance and ADL assessments 2. DVT proph Lovenox 3. Social work consult 4. Rehab consults 5. Radial nerve splint will not be available until Monday Patient appropriate for transfer of service.  Altamese [place], MD [facility] [phone] (p)

## 2016-12-25 ENCOUNTER — Inpatient Hospital Stay (HOSPITAL_COMMUNITY): Payer: Medicare HMO

## 2016-12-25 LAB — URINALYSIS, ROUTINE W REFLEX MICROSCOPIC
BILIRUBIN URINE: NEGATIVE
GLUCOSE, UA: NEGATIVE mg/dL
HGB URINE DIPSTICK: NEGATIVE
KETONES UR: NEGATIVE mg/dL
LEUKOCYTES UA: NEGATIVE
Nitrite: NEGATIVE
PH: 7 (ref 5.0–8.0)
PROTEIN: NEGATIVE mg/dL
Specific Gravity, Urine: 1.029 (ref 1.005–1.030)

## 2016-12-25 MED ORDER — GADOBENATE DIMEGLUMINE 529 MG/ML IV SOLN
20.0000 mL | Freq: Once | INTRAVENOUS | Status: AC | PRN
  Administered 2016-12-25: 20 mL via INTRAVENOUS

## 2016-12-25 NOTE — Progress Notes (Signed)
PROGRESS NOTE        PATIENT DETAILS Name: Taylor Brown Age: 66 y.o. Sex: female Date of Birth: 1951/02/05 Admit Date: 12/19/2016 Admitting Physician Altamese Stockholm, MD [id number], [name]  Brief Narrative: Patient is a 66 y.o. female with long-standing history of tobacco and alcohol abuse -last seen by a primary M.D. more than 50 years back, transferred from [facility] to the orthopedic service for evaluation of right supracondylar distal humerus fracture with acute radial nerve palsy. Further evaluation revealed that the distal humerus fracture was pathological, and upon further evaluation-she was found to have a left breast mass with extensive metastatic disease to her spine,and liver.. See below for further details   Subjective: She is awake, alert. No major complaints overnight.  Assessment/Plan: Right supracondylar humerus pathological fracture with acute radial palsy: Postop care deferred to the orthopedic service. Suspected to be a pathological fracture-radiation oncology aware of this patient-and the need for possible palliative radiation.   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 MRI of the entire spine showed significant metastatic disease around the thoracic 3rd vertebra, but numerous other vertebra is also involved. MRI findings was discussed with radiation oncology on call-Dr Squire-since there is no acute cord compression-no immediate recommendations was provided-radiation oncology will formally evaluated tomorrow and provide further recommendations.  Acute toxic metabolic encephalopathy: Resolved, she is completely awake and alert. Etiology of encephalopathy felt to be multifactorial-secondary to alcohol withdrawal and medication effect.   Alcohol withdrawal: No further withdrawal symptoms, last drink on 6/9,  should be out of the window for further alcohol withdrawal symptoms. Managed with Ativan per protocol.   Antral gastritis-EtOH an NSAID-induced: Status post EGD at [facility] for antral gastritis-continue with PPI. No overt evidence of bleeding at this time.  Tobacco abuse: Counseled-not sure if she has any indication to quit at this time. We will continue to follow.  Macrocytosis: Folate levels within normal limits-vitamin B-12 level-borderline low.Await methylmalonicacid-if increased- begin B12 supplementation  Hypertension: Continue metoprolol-follow BP trend and adjust accordingly.  Mildly elevated TSH: Likely subclinical hypothyroidism-no indication for supplementation-stable for outpatient follow-up.  Echo (reviewed):EF 65-70% on TTE done on 6/12  DVT Prophylaxis: Prophylactic Lovenox   Code Status: Full code   Disposition Plan: SNF on discharge  Family communication: None at bedside  Antimicrobial agents: Anti-infectives    Start     Dose/Rate Route Frequency Ordered Stop   12/23/16 1700  ceFAZolin (ANCEF) IVPB 1 g/50 mL premix     1 g 100 mL/hr over 30 Minutes Intravenous Every 8 hours 12/23/16 1421 12/24/16 1041   12/23/16 1000  ceFAZolin (ANCEF) IVPB 2g/100 mL premix     2 g 200 mL/hr over 30 Minutes Intravenous  Once 12/22/16 0956 12/23/16 1003   12/20/16 0600  ceFAZolin (ANCEF) IVPB 2g/100 mL premix  Status:  Discontinued     2 g 200 mL/hr over 30 Minutes Intravenous To Short Stay 12/19/16 1611 12/21/16 0600      Time spent: 25- minutes-Greater than 50% of this time was spent in counseling, explanation of diagnosis, planning of further management, and coordination of care.  MEDICATIONS: Scheduled Meds: . cholecalciferol  2,000 Units Oral BID  . enoxaparin (LOVENOX) injection  40 mg Subcutaneous Q24H  . feeding supplement  1 Container  Oral BID BM  . folic acid  1 mg Oral Daily  . mouth rinse  15 mL Mouth Rinse BID  . metoprolol tartrate  12.5 mg Oral  BID  . multivitamin with minerals  1 tablet Oral Daily  . pantoprazole  40 mg Oral BID AC  . polyethylene glycol  17 g Oral Daily  . senna-docusate  2 tablet Oral QHS  . thiamine  100 mg Oral Daily   Continuous Infusions: . lactated ringers 10 mL/hr at 12/23/16 0930  . methocarbamol (ROBAXIN)  IV     PRN Meds:.acetaminophen **OR** acetaminophen, bisacodyl, cloNIDine, hydrALAZINE, ipratropium-albuterol, methocarbamol **OR** methocarbamol (ROBAXIN)  IV, morphine injection, oxyCODONE   PHYSICAL EXAM: Vital signs: Vitals:   12/24/16 0516 12/24/16 1800 12/24/16 2119 12/25/16 0708  BP: (!) 114/56 (!) 107/58 (!) 108/53 135/70  Pulse: (!) 101  97 100  Resp: 16  18 18   Temp: 99 F (37.2 C)  99.6 F (37.6 C) 98.6 F (37 C)  TempSrc: Oral  Oral Oral  SpO2: 97%  97% 93%   There were no vitals filed for this visit. There is no height or weight on file to calculate BMI.   General appearance :Awake, alert, not in any distress.  Eyes:, pupils equally reactive to light and accomodation,no scleral icterus. HEENT: Atraumatic and Normocephalic Neck: supple, no JVD. Resp:Good air entry bilaterally CVS: S1 S2 regular, no murmurs.  GI: Bowel sounds present, Non tender and not distended with no gaurding, rigidity or rebound. Extremities: B/L Lower Ext shows no edema Neurology:  Non focal Psychiatric: Normal judgment and insight.  Musculoskeletal:No digital cyanosis Skin:No Rash, warm and dry Wounds:N/A  I have personally reviewed following labs and imaging studies  LABORATORY DATA: CBC:  Recent Labs Lab 12/20/16 0458 12/20/16 1001 12/21/16 0312 12/22/16 0458 12/23/16 0355 12/23/16 2337  WBC 7.9  --  7.6 7.0 7.3 8.0  NEUTROABS  --   --  5.1  --   --   --   HGB 13.6  --  13.8 13.4 13.5 12.5  HCT 42.2 38.8 42.5 42.9 42.9 39.2  MCV 107.1*  --  106.8* 110.3* 110.3* 107.7*  PLT 168  --  194 211 257 003    Basic Metabolic Panel:  Recent Labs Lab 12/18/16 1520 12/19/16 0428   12/20/16 1121 12/21/16 0312 12/22/16 0458 12/23/16 0355 12/23/16 1923 12/24/16 0716  NA  --  136  < >  --  139 143 142 137 136  K  --  3.5  < >  --  4.0 4.8 4.5 4.2 4.1  CL  --  98*  < >  --  104 106 103 101 99*  CO2  --  27  < >  --  24 28 27 27 28   GLUCOSE  --  110*  < >  --  124* 141* 110* 135* 113*  BUN  --  7  < >  --  13 11 11 9 9   CREATININE  --  0.46  < >  --  0.64 0.54 0.56 0.58 0.58  CALCIUM  --  8.4*  < >  --  8.5* 9.3 9.1 8.9 8.6*  MG 1.5* 1.5*  --  2.0  --  1.9  --   --  1.8  PHOS  --   --   --  2.8  --   --   --   --   --   < > = values in this interval not displayed.  GFR: Estimated Creatinine Clearance: 68.5 mL/min (by C-G formula based on SCr of 0.58 mg/dL).  Liver Function Tests:  Recent Labs Lab 12/20/16 0458 12/21/16 0312 12/22/16 0458 12/23/16 0355  AST 40 39 34 38  ALT 36 34 31 31  ALKPHOS 116 105 98 107  BILITOT 1.4* 1.0 0.9 0.9  PROT 6.0* 6.2* 6.0* 6.4*  ALBUMIN 2.7* 2.8* 2.8* 2.8*   No results for input(s): LIPASE, AMYLASE in the last 168 hours.  Recent Labs Lab 12/20/16 1001  AMMONIA 18    Coagulation Profile:  Recent Labs Lab 12/21/16 0312  INR 1.00    Cardiac Enzymes: No results for input(s): CKTOTAL, CKMB, CKMBINDEX, TROPONINI in the last 168 hours.  BNP (last 3 results) No results for input(s): PROBNP in the last 8760 hours.  HbA1C: No results for input(s): HGBA1C in the last 72 hours.  CBG:  Recent Labs Lab 12/20/16 1214 12/21/16 0831  GLUCAP 103* 138*    Lipid Profile: No results for input(s): CHOL, HDL, LDLCALC, TRIG, CHOLHDL, LDLDIRECT in the last 72 hours.  Thyroid Function Tests: No results for input(s): TSH, T4TOTAL, FREET4, T3FREE, THYROIDAB in the last 72 hours.  Anemia Panel: No results for input(s): VITAMINB12, FOLATE, FERRITIN, TIBC, IRON, RETICCTPCT in the last 72 hours.  Urine analysis:    Component Value Date/Time   COLORURINE YELLOW 12/22/2016 2210   APPEARANCEUR HAZY (A) 12/22/2016 2210     LABSPEC 1.016 12/22/2016 2210   PHURINE 5.0 12/22/2016 2210   GLUCOSEU NEGATIVE 12/22/2016 2210   HGBUR MODERATE (A) 12/22/2016 2210   BILIRUBINUR NEGATIVE 12/22/2016 2210   KETONESUR NEGATIVE 12/22/2016 2210   PROTEINUR 30 (A) 12/22/2016 2210   UROBILINOGEN 0.2 05/13/2012 1440   NITRITE POSITIVE (A) 12/22/2016 2210   LEUKOCYTESUR LARGE (A) 12/22/2016 2210    Sepsis Labs: Lactic Acid, Venous No results found for: LATICACIDVEN  MICROBIOLOGY: Recent Results (from the past 240 hour(s))  MRSA PCR Screening     Status: None   Collection Time: 12/19/16  5:29 PM  Result Value Ref Range Status   MRSA by PCR NEGATIVE NEGATIVE Final    Comment:        The GeneXpert MRSA Assay (FDA approved for NASAL specimens only), is one component of a comprehensive MRSA colonization surveillance program. It is not intended to diagnose MRSA infection nor to guide or monitor treatment for MRSA infections.   Culture, Urine     Status: Abnormal   Collection Time: 12/20/16 11:10 AM  Result Value Ref Range Status   Specimen Description URINE, RANDOM  Final   Special Requests NONE  Final   Culture <10,000 COLONIES/mL INSIGNIFICANT GROWTH (A)  Final   Report Status 12/21/2016 FINAL  Final  Surgical pcr screen     Status: None   Collection Time: 12/22/16  9:09 PM  Result Value Ref Range Status   MRSA, PCR NEGATIVE NEGATIVE Final   Staphylococcus aureus NEGATIVE NEGATIVE Final    Comment:        The Xpert SA Assay (FDA approved for NASAL specimens in patients over 67 years of age), is one component of a comprehensive surveillance program.  Test performance has been validated by [facility] for patients greater than or equal to 12 year old. It is not intended to diagnose infection nor to guide or monitor treatment.     RADIOLOGY STUDIES/RESULTS: Dg Forearm Right  Result Date: 12/20/2016 CLINICAL DATA:  Right arm fracture EXAM: RIGHT FOREARM - 2 VIEW COMPARISON:  None. FINDINGS: No  fracture or dislocation is seen in the forearm. Distal humeral fracture may be visible on one of the two views, but is better demonstrated on the prior study. Overlying cast obscures fine osseous detail. IMPRESSION: No fracture or dislocation is seen in the forearm. Known distal humerus fracture is not well visualized. Electronically Signed   By: Julian Hy M.D.   On: 12/20/2016 11:19   Ct Chest W Contrast  Result Date: 12/24/2016 CLINICAL DATA:  Inpatient admitted with pathologic right humerus fracture and left breast mass. Suspected metastatic breast cancer. Staging. EXAM: CT CHEST, ABDOMEN, AND PELVIS WITH CONTRAST TECHNIQUE: Multidetector CT imaging of the chest, abdomen and pelvis was performed following the standard protocol during bolus administration of intravenous contrast. CONTRAST:  157m ISOVUE-300 IOPAMIDOL (ISOVUE-300) INJECTION 61% COMPARISON:  12/17/2016 chest CT. FINDINGS: CT CHEST FINDINGS Cardiovascular: Top-normal heart size. No significant pericardial fluid/thickening. Atherosclerotic nonaneurysmal thoracic aorta. Normal caliber pulmonary arteries. No central pulmonary emboli. Mediastinum/Nodes: No discrete thyroid nodules. Unremarkable esophagus. No pathologically enlarged axillary, mediastinal or hilar lymph nodes. Lungs/Pleura: No pneumothorax. No pleural effusion. Right middle lobe 5 mm (series 4/image 81) and 4 mm lingular (series 4/image 68) solid pulmonary nodules, both stable since 12/17/2016. Subsegmental atelectasis in the dependent lungs bilaterally. Otherwise no acute consolidative airspace disease, lung masses or additional significant pulmonary nodules. Musculoskeletal: Expansile lytic destructive bone lesion at the right third costovertebral junction (series 4/image 20) with mass-effect on the right thoracic spinal canal. Healed deformities of the lateral left seventh and eighth ribs. Partially visualized surgical fixation of the right distal humerus. Marked thoracic  spondylosis . Irregular solid 5.3 x 2.8 cm outer left breast mass, extending to the skin (series 3/image 30). CT ABDOMEN PELVIS FINDINGS Hepatobiliary: Diffuse hepatic steatosis. The known liver masses (at least 4) in the liver seen on the 12/17/2016 chest CT study are poorly visualized on the routine phase sequence through the liver on today's scan, probably due to differences in contrast timing. Representative liver masses include a 1.7 x 1.5 cm liver dome lesion (series 3/ image 43), a 1.5 x 1.1 cm segment 4B left liver lobe lesion (series 8/ image 7) and a 0.9 x 0.7 cm segment 3 left liver lobe lesion (series 8/image 8). Normal gallbladder with no radiopaque cholelithiasis. No biliary ductal dilatation. Pancreas: Normal, with no mass or duct dilation. Spleen: Normal size. No mass. Adrenals/Urinary Tract: Suggestion of an indeterminate 1.2 cm lower left adrenal nodule (series 3/ image 54) with density 875 HU. No additional discrete adrenal nodules. No hydronephrosis. Mild segmental renal cortical scarring in the upper right kidney. No renal mass. Bladder collapsed by indwelling Foley catheter, limiting bladder evaluation. Grossly normal bladder. Stomach/Bowel: Grossly normal stomach. Normal caliber small bowel with no small bowel wall thickening. Appendectomy. Normal large bowel with no diverticulosis, large bowel wall thickening or pericolonic fat stranding. Vascular/Lymphatic: Atherosclerotic nonaneurysmal abdominal aorta. Patent portal, splenic, hepatic and renal veins. No pathologically enlarged lymph nodes in the abdomen or pelvis. Reproductive: Suggestion of abnormal endometrial thickening (approximately 10 mm). No adnexal mass. Other: No pneumoperitoneum, ascites or focal fluid collection. Small fat containing umbilical hernia. Musculoskeletal: No aggressive appearing focal osseous lesions. Mild L3 and L4 superior vertebral compression fractures of indeterminate chronicity. Moderate lumbar spondylosis .  IMPRESSION: 1. Irregular solid 5.3 cm upper left breast mass extending to the skin, suspicious for primary left breast malignancy. Recommend correlation with diagnostic mammographic evaluation. 2. Expansile lytic destructive bone lesion at the right third costovertebral junction with mass-effect on the right thoracic  spinal canal, compatible with osseous metastasis. Consider correlation with thoracic spine MRI without and with IV contrast to evaluate for possible thoracic cord compression, as clinically warranted . 3. Two subcentimeter pulmonary nodules, indeterminate for metastases. Recommend attention on follow-up chest CT in 3 months. 4. Four scattered small liver masses, suspicious for liver metastases. These could be further evaluated with MRI abdomen without and with IV contrast versus PET-CT, as clinically warranted . 5. Possible small left adrenal metastasis. 6. Suggestion of abnormal endometrial thickening (10 mm). Recommend correlation would transabdominal and transvaginal pelvic ultrasound. 7. Mild L3 and L4 vertebral compression fractures of indeterminate chronicity. 8. Additional chronic findings as above. Electronically Signed   By: Ilona Sorrel M.D.   On: 12/24/2016 18:07   Ct Angio Chest Pe W Or Wo Contrast  Result Date: 12/17/2016 CLINICAL DATA:  Shortness of Breath EXAM: CT ANGIOGRAPHY CHEST WITH CONTRAST TECHNIQUE: Multidetector CT imaging of the chest was performed using the standard protocol during bolus administration of intravenous contrast. Multiplanar CT image reconstructions and MIPs were obtained to evaluate the vascular anatomy. CONTRAST:  75 mL Isovue 370 nonionic COMPARISON:  Chest radiograph December 17, 2016 FINDINGS: Cardiovascular: There is no demonstrable pulmonary embolus. There is no thoracic aortic aneurysm or dissection. Main pulmonary outflow tract measures 3.1 cm in length. There is mild calcification at the origin of the left subclavian artery. Other visualized great vessels  appear unremarkable. Pericardium is not appreciably thickened. There is left ventricular hypertrophy. Mediastinum/Nodes: Visualized thyroid appears unremarkable. There is no appreciable thoracic adenopathy. Lungs/Pleura: There is mild bibasilar atelectasis. There is no parenchymal lung edema or consolidation. No pleural effusion or pleural thickening evident. Upper Abdomen: There is diffuse hepatic steatosis. There is an enhancing focus in the anterior segment of the right lobe of the liver measuring 1.7 x 1.7 cm. A similar appearing lesion is noted near the dome of the liver in the medial segment left lobe measuring 1.5 x 1.3 cm. No other focal liver lesions are apparent ; note that liver is incompletely visualized. Visualized upper abdominal structures otherwise appear unremarkable. Musculoskeletal: There is degenerative change in the thoracic spine. There are no blastic or lytic bone lesions. There are several healed rib fractures on the left. Review of the MIP images confirms the above findings. IMPRESSION: No demonstrable pulmonary embolus. Prominence of the main pulmonary outflow tract suggests a degree of pulmonary arterial hypertension. No adenopathy. No lung edema or consolidation.  Bibasilar atelectasis noted. Enhancing lesions in the liver near the dome noted. Etiology uncertain. This finding warrants nonemergent pre and serial post-contrast CT or MR of the liver to further evaluate. Old healed rib fractures on the left. Left ventricular hypertrophy. Electronically Signed   By: Lowella Grip III M.D.   On: 12/17/2016 13:51   Mr Cervical Spine W Wo Contrast  Result Date: 12/25/2016 CLINICAL DATA:  Metastatic disease.  Breast mass. EXAM: MRI TOTAL SPINE WITHOUT AND WITH CONTRAST TECHNIQUE: Multisequence MR imaging of the spine from the cervical spine to the sacrum was performed prior to and following IV contrast administration for evaluation of spinal metastatic disease. CONTRAST:  72m MULTIHANCE  GADOBENATE DIMEGLUMINE 529 MG/ML IV SOLN COMPARISON:  CT chest abdomen pelvis 12/24/2016 FINDINGS: MRI CERVICAL SPINE FINDINGS Image quality degraded by motion. Alignment: Normal Vertebrae: No fracture identified. Enhancing lesion C7 vertebral body compatible with metastatic disease. No epidural extension. T3 metastatic disease also noted. Cord: Negative for cord compression.  Cord signal normal. Posterior Fossa, vertebral arteries, paraspinal tissues: Negative Disc  levels: Mild disc degeneration at C4-5 and C5-6 and C6-7. Mild facet degeneration on the left at C4-5. Left foraminal narrowing C5-6 due to disc protrusion and spurring. MRI THORACIC SPINE FINDINGS Alignment:  Normal Vertebrae: Metastatic disease throughout the T3 vertebral body extending into the pedicle and posterior elements. There is also tumor in the right foramen at T2-3 and T3-4 which could cause radicular symptoms. Slight epidural tumor without cord compression. Tumor also present in the spinous processes of T1, T2, T3, T8. Small tumor deposit T5 vertebral body and right superior articulating facet. Tumor in the right sixth rib. Small enhancing lesions T12 vertebral body compatible with metastatic disease. Accentuated dorsal kyphosis.  No pathologic fracture Cord:  Negative for cord compression.  Spinal cord signal normal. Paraspinal and other soft tissues: Negative Disc levels: Multilevel thoracic disc degeneration without significant spinal stenosis. MRI LUMBAR SPINE FINDINGS Segmentation:  Normal Alignment:  Normal Vertebrae: Enhancing metastatic disease is seen in the lumbar spine. Enhancing lesion on the right at L2. Diffuse vertebral body enhancement of L3 with pathologic fracture. Enhancing tumor extends in the pedicle and posterior elements on the right without foraminal encroachment. Small enhancing lesion S2 on the left. Mild fracture of L4 appears chronic and benign. Conus medullaris: Extends to the L1-2 level and appears normal.  Paraspinal and other soft tissues: Diffuse paraspinous muscle atrophy. No retroperitoneal adenopathy Disc levels: L1-2:  Negative L2-3:  Disc and facet degeneration with moderate spinal stenosis L3-4:  Disc and facet degeneration with severe spinal stenosis L4-5:  Disc and facet degeneration with moderate spinal stenosis L5-S1:  Severe facet degeneration without stenosis. IMPRESSION: Metastatic disease C7 vertebral body without epidural tumor or fracture Multiple metastatic deposits in the thoracic and lumbar spine. Prominent tumor is present on the right at T3 extending into the soft tissues as well as the foramina on the right at T2-3 and T3-4. See above detailed Advanced lumbar degenerative changes with spinal stenosis at L2-3, L3-4, L4-5 Electronically Signed   By: Franchot Gallo M.D.   On: 12/25/2016 11:51   Mr Thoracic Spine W Wo Contrast  Result Date: 12/25/2016 CLINICAL DATA:  Metastatic disease.  Breast mass. EXAM: MRI TOTAL SPINE WITHOUT AND WITH CONTRAST TECHNIQUE: Multisequence MR imaging of the spine from the cervical spine to the sacrum was performed prior to and following IV contrast administration for evaluation of spinal metastatic disease. CONTRAST:  59m MULTIHANCE GADOBENATE DIMEGLUMINE 529 MG/ML IV SOLN COMPARISON:  CT chest abdomen pelvis 12/24/2016 FINDINGS: MRI CERVICAL SPINE FINDINGS Image quality degraded by motion. Alignment: Normal Vertebrae: No fracture identified. Enhancing lesion C7 vertebral body compatible with metastatic disease. No epidural extension. T3 metastatic disease also noted. Cord: Negative for cord compression.  Cord signal normal. Posterior Fossa, vertebral arteries, paraspinal tissues: Negative Disc levels: Mild disc degeneration at C4-5 and C5-6 and C6-7. Mild facet degeneration on the left at C4-5. Left foraminal narrowing C5-6 due to disc protrusion and spurring. MRI THORACIC SPINE FINDINGS Alignment:  Normal Vertebrae: Metastatic disease throughout the T3  vertebral body extending into the pedicle and posterior elements. There is also tumor in the right foramen at T2-3 and T3-4 which could cause radicular symptoms. Slight epidural tumor without cord compression. Tumor also present in the spinous processes of T1, T2, T3, T8. Small tumor deposit T5 vertebral body and right superior articulating facet. Tumor in the right sixth rib. Small enhancing lesions T12 vertebral body compatible with metastatic disease. Accentuated dorsal kyphosis.  No pathologic fracture Cord:  Negative for cord  compression.  Spinal cord signal normal. Paraspinal and other soft tissues: Negative Disc levels: Multilevel thoracic disc degeneration without significant spinal stenosis. MRI LUMBAR SPINE FINDINGS Segmentation:  Normal Alignment:  Normal Vertebrae: Enhancing metastatic disease is seen in the lumbar spine. Enhancing lesion on the right at L2. Diffuse vertebral body enhancement of L3 with pathologic fracture. Enhancing tumor extends in the pedicle and posterior elements on the right without foraminal encroachment. Small enhancing lesion S2 on the left. Mild fracture of L4 appears chronic and benign. Conus medullaris: Extends to the L1-2 level and appears normal. Paraspinal and other soft tissues: Diffuse paraspinous muscle atrophy. No retroperitoneal adenopathy Disc levels: L1-2:  Negative L2-3:  Disc and facet degeneration with moderate spinal stenosis L3-4:  Disc and facet degeneration with severe spinal stenosis L4-5:  Disc and facet degeneration with moderate spinal stenosis L5-S1:  Severe facet degeneration without stenosis. IMPRESSION: Metastatic disease C7 vertebral body without epidural tumor or fracture Multiple metastatic deposits in the thoracic and lumbar spine. Prominent tumor is present on the right at T3 extending into the soft tissues as well as the foramina on the right at T2-3 and T3-4. See above detailed Advanced lumbar degenerative changes with spinal stenosis at L2-3,  L3-4, L4-5 Electronically Signed   By: Franchot Gallo M.D.   On: 12/25/2016 11:51   Mr Lumbar Spine W Wo Contrast  Result Date: 12/25/2016 CLINICAL DATA:  Metastatic disease.  Breast mass. EXAM: MRI TOTAL SPINE WITHOUT AND WITH CONTRAST TECHNIQUE: Multisequence MR imaging of the spine from the cervical spine to the sacrum was performed prior to and following IV contrast administration for evaluation of spinal metastatic disease. CONTRAST:  33m MULTIHANCE GADOBENATE DIMEGLUMINE 529 MG/ML IV SOLN COMPARISON:  CT chest abdomen pelvis 12/24/2016 FINDINGS: MRI CERVICAL SPINE FINDINGS Image quality degraded by motion. Alignment: Normal Vertebrae: No fracture identified. Enhancing lesion C7 vertebral body compatible with metastatic disease. No epidural extension. T3 metastatic disease also noted. Cord: Negative for cord compression.  Cord signal normal. Posterior Fossa, vertebral arteries, paraspinal tissues: Negative Disc levels: Mild disc degeneration at C4-5 and C5-6 and C6-7. Mild facet degeneration on the left at C4-5. Left foraminal narrowing C5-6 due to disc protrusion and spurring. MRI THORACIC SPINE FINDINGS Alignment:  Normal Vertebrae: Metastatic disease throughout the T3 vertebral body extending into the pedicle and posterior elements. There is also tumor in the right foramen at T2-3 and T3-4 which could cause radicular symptoms. Slight epidural tumor without cord compression. Tumor also present in the spinous processes of T1, T2, T3, T8. Small tumor deposit T5 vertebral body and right superior articulating facet. Tumor in the right sixth rib. Small enhancing lesions T12 vertebral body compatible with metastatic disease. Accentuated dorsal kyphosis.  No pathologic fracture Cord:  Negative for cord compression.  Spinal cord signal normal. Paraspinal and other soft tissues: Negative Disc levels: Multilevel thoracic disc degeneration without significant spinal stenosis. MRI LUMBAR SPINE FINDINGS Segmentation:   Normal Alignment:  Normal Vertebrae: Enhancing metastatic disease is seen in the lumbar spine. Enhancing lesion on the right at L2. Diffuse vertebral body enhancement of L3 with pathologic fracture. Enhancing tumor extends in the pedicle and posterior elements on the right without foraminal encroachment. Small enhancing lesion S2 on the left. Mild fracture of L4 appears chronic and benign. Conus medullaris: Extends to the L1-2 level and appears normal. Paraspinal and other soft tissues: Diffuse paraspinous muscle atrophy. No retroperitoneal adenopathy Disc levels: L1-2:  Negative L2-3:  Disc and facet degeneration with moderate spinal  stenosis L3-4:  Disc and facet degeneration with severe spinal stenosis L4-5:  Disc and facet degeneration with moderate spinal stenosis L5-S1:  Severe facet degeneration without stenosis. IMPRESSION: Metastatic disease C7 vertebral body without epidural tumor or fracture Multiple metastatic deposits in the thoracic and lumbar spine. Prominent tumor is present on the right at T3 extending into the soft tissues as well as the foramina on the right at T2-3 and T3-4. See above detailed Advanced lumbar degenerative changes with spinal stenosis at L2-3, L3-4, L4-5 Electronically Signed   By: Franchot Gallo M.D.   On: 12/25/2016 11:51   Ct Abdomen Pelvis W Contrast  Result Date: 12/24/2016 CLINICAL DATA:  Inpatient admitted with pathologic right humerus fracture and left breast mass. Suspected metastatic breast cancer. Staging. EXAM: CT CHEST, ABDOMEN, AND PELVIS WITH CONTRAST TECHNIQUE: Multidetector CT imaging of the chest, abdomen and pelvis was performed following the standard protocol during bolus administration of intravenous contrast. CONTRAST:  153m ISOVUE-300 IOPAMIDOL (ISOVUE-300) INJECTION 61% COMPARISON:  12/17/2016 chest CT. FINDINGS: CT CHEST FINDINGS Cardiovascular: Top-normal heart size. No significant pericardial fluid/thickening. Atherosclerotic nonaneurysmal thoracic  aorta. Normal caliber pulmonary arteries. No central pulmonary emboli. Mediastinum/Nodes: No discrete thyroid nodules. Unremarkable esophagus. No pathologically enlarged axillary, mediastinal or hilar lymph nodes. Lungs/Pleura: No pneumothorax. No pleural effusion. Right middle lobe 5 mm (series 4/image 81) and 4 mm lingular (series 4/image 68) solid pulmonary nodules, both stable since 12/17/2016. Subsegmental atelectasis in the dependent lungs bilaterally. Otherwise no acute consolidative airspace disease, lung masses or additional significant pulmonary nodules. Musculoskeletal: Expansile lytic destructive bone lesion at the right third costovertebral junction (series 4/image 20) with mass-effect on the right thoracic spinal canal. Healed deformities of the lateral left seventh and eighth ribs. Partially visualized surgical fixation of the right distal humerus. Marked thoracic spondylosis . Irregular solid 5.3 x 2.8 cm outer left breast mass, extending to the skin (series 3/image 30). CT ABDOMEN PELVIS FINDINGS Hepatobiliary: Diffuse hepatic steatosis. The known liver masses (at least 4) in the liver seen on the 12/17/2016 chest CT study are poorly visualized on the routine phase sequence through the liver on today's scan, probably due to differences in contrast timing. Representative liver masses include a 1.7 x 1.5 cm liver dome lesion (series 3/ image 43), a 1.5 x 1.1 cm segment 4B left liver lobe lesion (series 8/ image 7) and a 0.9 x 0.7 cm segment 3 left liver lobe lesion (series 8/image 8). Normal gallbladder with no radiopaque cholelithiasis. No biliary ductal dilatation. Pancreas: Normal, with no mass or duct dilation. Spleen: Normal size. No mass. Adrenals/Urinary Tract: Suggestion of an indeterminate 1.2 cm lower left adrenal nodule (series 3/ image 54) with density 875 HU. No additional discrete adrenal nodules. No hydronephrosis. Mild segmental renal cortical scarring in the upper right kidney. No  renal mass. Bladder collapsed by indwelling Foley catheter, limiting bladder evaluation. Grossly normal bladder. Stomach/Bowel: Grossly normal stomach. Normal caliber small bowel with no small bowel wall thickening. Appendectomy. Normal large bowel with no diverticulosis, large bowel wall thickening or pericolonic fat stranding. Vascular/Lymphatic: Atherosclerotic nonaneurysmal abdominal aorta. Patent portal, splenic, hepatic and renal veins. No pathologically enlarged lymph nodes in the abdomen or pelvis. Reproductive: Suggestion of abnormal endometrial thickening (approximately 10 mm). No adnexal mass. Other: No pneumoperitoneum, ascites or focal fluid collection. Small fat containing umbilical hernia. Musculoskeletal: No aggressive appearing focal osseous lesions. Mild L3 and L4 superior vertebral compression fractures of indeterminate chronicity. Moderate lumbar spondylosis . IMPRESSION: 1. Irregular solid 5.3 cm upper  left breast mass extending to the skin, suspicious for primary left breast malignancy. Recommend correlation with diagnostic mammographic evaluation. 2. Expansile lytic destructive bone lesion at the right third costovertebral junction with mass-effect on the right thoracic spinal canal, compatible with osseous metastasis. Consider correlation with thoracic spine MRI without and with IV contrast to evaluate for possible thoracic cord compression, as clinically warranted . 3. Two subcentimeter pulmonary nodules, indeterminate for metastases. Recommend attention on follow-up chest CT in 3 months. 4. Four scattered small liver masses, suspicious for liver metastases. These could be further evaluated with MRI abdomen without and with IV contrast versus PET-CT, as clinically warranted . 5. Possible small left adrenal metastasis. 6. Suggestion of abnormal endometrial thickening (10 mm). Recommend correlation would transabdominal and transvaginal pelvic ultrasound. 7. Mild L3 and L4 vertebral compression  fractures of indeterminate chronicity. 8. Additional chronic findings as above. Electronically Signed   By: Ilona Sorrel M.D.   On: 12/24/2016 18:07   Dg Chest Port 1 View  Result Date: 12/23/2016 CLINICAL DATA:  Shortness of breath EXAM: PORTABLE CHEST 1 VIEW COMPARISON:  12/17/2016 FINDINGS: Linear atelectasis at the medial left base. Cannot exclude small left pleural effusion. Mild cardiomegaly with atherosclerosis. No overt pulmonary edema. No pneumothorax. Old left rib fracture. IMPRESSION: 1. Cardiomegaly without edema or infiltrate 2. Subsegmental atelectasis at the left lung base. Electronically Signed   By: Donavan Foil M.D.   On: 12/23/2016 19:08   Dg Chest Portable 1 View  Result Date: 12/17/2016 CLINICAL DATA:  Shortness of breath and tachycardia EXAM: PORTABLE CHEST 1 VIEW COMPARISON:  05/13/2012 FINDINGS: Cardiomegaly noted. There is no evidence of focal airspace disease, pulmonary edema, suspicious pulmonary nodule/mass, pleural effusion, or pneumothorax. No acute bony abnormalities are identified. IMPRESSION: Cardiomegaly without evidence of acute cardiopulmonary disease. Electronically Signed   By: Margarette Canada M.D.   On: 12/17/2016 12:11   Dg Bone Survey Met  Result Date: 12/23/2016 CLINICAL DATA:  (Breast lesion. Pathologic fracture of the right humerus. Status post ORIF of the right humerus. Evaluate for metastasis. EXAM: METASTATIC BONE SURVEY COMPARISON:  CXR and right humerus radiographs from 12/17/2016, left ankle radiographs from 05/11/2012, left wrist radiographs small 05/11/2012 FINDINGS: Lateral skull: Occipital lytic skull lucency measuring 13 x 8 mm. No fracture. Both shoulders: Negative for lytic or blastic disease. No acute fracture nor dislocation. Left old sixth and seventh rib fractures are incidentally included. Both humeri: Pathologic fracture through moth eaten lytic lesion of the distal right humeral diaphysis transfixed by plate and screws. Lesion at the junction of  the middle and distal third of the humeral shaft. Left humerus is negative. Radius and ulna: Negative for lytic or blastic disease. Volar plate and screw fixation across distal left radius fracture. Cervical spine AP and lateral: Degenerative disc and facet arthropathy. No suspicious osseous abnormality. Thoracic spine: Multilevel degenerative disc and endplate changes consistent with spondylosis. Lumbar spine: Chronic appearing superior endplate compressions of L1, L3 and L4 without retropulsion. No lytic or blastic disease. Lower lumbar facet arthropathy from L3 through S1. Slight disc space narrowing at L5-S1. PA chest: Normal size heart. Aortic atherosclerosis. Atelectasis at the lung bases. No dominant mass. Elevation of the right hemidiaphragm versus eventration. Old left-sided rib fractures. AP pelvis: Lower lumbar facet arthropathy. Intact SI joints and pubic symphysis with osteoarthritis of the pubic symphysis. Old right inferior pubic ramus fracture. No suspicious lytic or blastic disease of the pelvis and included hips. Both femora, tibia and fibula: Negative for lytic or  blastic disease. ORIF of left bimalleolar fractures. IMPRESSION: 13 x 8 mm lytic focus involving the occiput of the skull which could be a normal variant such is an arachnoid granulation. CT may help for further correlation. No additional lytic or blastic disease are apparent. Pathologic fracture transfixed by plate and screws involving the distal right humeral shaft. ORIF of the distal left radius and left bimalleolar fractures are also noted. Cervical, thoracic and lumbar spondylosis. Chronic benign-appearing endplate compressions of L1, L3 and L4. Electronically Signed   By: Ashley Royalty M.D.   On: 12/23/2016 17:11   Dg Humerus Right  Result Date: 12/23/2016 CLINICAL DATA:  OPEN REDUCTION INTERNAL FIXATION (ORIF) DISTAL HUMERUS FRACTURE (Right Arm Upper). Radiation Safety Timeout performed by BIW. Fluoro time 12 seconds. EXAM: DG  C-ARM 61-120 MIN; RIGHT HUMERUS - 2+ VIEW COMPARISON:  12/20/2016 FINDINGS: Patient has undergone screw plate fixation of right humerus fracture. A cortical screw traverses the fracture site. There is near anatomic alignment. Elbow is unremarkable in appearance. IMPRESSION: Status post ORIF of the right humerus. Lucency at the fracture site, raising the question of pathologic fracture. Correlation with history is recommended. Electronically Signed   By: Nolon Nations M.D.   On: 12/23/2016 13:53   Dg Humerus Right  Result Date: 12/17/2016 CLINICAL DATA:  Acute onset pain while lifting heavy object EXAM: RIGHT HUMERUS - 2+ VIEW COMPARISON:  None. FINDINGS: Frontal and lateral views were obtained. There is an obliquely oriented fracture of the distal humerus with medial angulation and anterior displacement of the distal fracture fragment with respect proximal fragment. No other fractures. No dislocations. No abnormal periosteal reaction or appreciable arthropathy. IMPRESSION: Obliquely oriented fracture distal humerus with displacement angulation of fracture fragments. No dislocation. No abnormal periosteal reaction. No appreciable arthropathic change. Electronically Signed   By: Lowella Grip III M.D.   On: 12/17/2016 11:43   Dg Hand Complete Left  Result Date: 12/20/2016 CLINICAL DATA:  Left hand swelling EXAM: LEFT HAND - COMPLETE 3+ VIEW COMPARISON:  None. FINDINGS: No evidence of acute fracture or dislocation. Status post ORIF of the distal radius. Moderate degenerative changes at the 1st carpometacarpal joint. The visualized soft tissues are unremarkable. IMPRESSION: No acute osseus abnormality is seen. Status post ORIF of the distal radius. Moderate degenerative changes of the 1st carpometacarpal joint. Electronically Signed   By: Julian Hy M.D.   On: 12/20/2016 11:10   Dg C-arm 1-60 Min  Result Date: 12/23/2016 CLINICAL DATA:  OPEN REDUCTION INTERNAL FIXATION (ORIF) DISTAL HUMERUS  FRACTURE (Right Arm Upper). Radiation Safety Timeout performed by BIW. Fluoro time 12 seconds. EXAM: DG C-ARM 61-120 MIN; RIGHT HUMERUS - 2+ VIEW COMPARISON:  12/20/2016 FINDINGS: Patient has undergone screw plate fixation of right humerus fracture. A cortical screw traverses the fracture site. There is near anatomic alignment. Elbow is unremarkable in appearance. IMPRESSION: Status post ORIF of the right humerus. Lucency at the fracture site, raising the question of pathologic fracture. Correlation with history is recommended. Electronically Signed   By: Nolon Nations M.D.   On: 12/23/2016 13:53     LOS: 6 days   Oren Binet, MD  [facility] Pager:[phone]  If 7PM-7AM, please contact night-coverage [email or web address] Password [email or web address] 12/25/2016, 12:16 PM

## 2016-12-25 NOTE — Progress Notes (Signed)
PT Cancellation Note  Patient Details Name: Taylor Brown MRN: [id number] DOB: [birth year]   Cancelled Treatment:    Reason Eval/Treat Not Completed: Patient at procedure or test/unavailable   Will follow up later today as time allows;  Otherwise, will follow up for PT tomorrow;   Thank you,  Roney Marion, PT  [facility] Services Pager [phone] Office [phone]     Colletta [place] 12/25/2016, 10:03 AM

## 2016-12-25 NOTE — Op Note (Signed)
NAME:  Taylor Brown, Taylor Brown                 ACCOUNT NO.:  [id number]  MEDICAL RECORD NO.:  [id number]  LOCATION:                                 FACILITY:  PHYSICIAN:  Astrid Divine. Marcelino Scot, M.D. DATE OF BIRTH:  Mar 14, 1951  DATE OF PROCEDURE:  12/23/2016 DATE OF DISCHARGE:                              OPERATIVE REPORT   PREOPERATIVE DIAGNOSES: 1. Right distal humeral shaft fracture. 2. Radial nerve palsy. 3. Overgrown toenails right and left first through fifth.  POSTOPERATIVE DIAGNOSES: 1. Right distal humeral shaft fracture, suspected pathologic. 2. Radial nerve palsy. 3. Overgrown toenails right and left first through fifth. 4. Left breast mass.  PROCEDURE: 1. Open reduction and internal fixation of right distal humeral shaft fracture, suspected pathologic. 2. Exploration of right radial nerve. 3. Toenail trimming, right and left first through fifth each. 4. Intraoperative consultation from Dr. Barry Dienes of General Surgery for Left breast mass.  SURGEON:  Astrid Divine. Marcelino Scot, M.D.  ASSISTANT:  Ainsley Spinner, PA-C.  ANESTHESIA:  General.  COMPLICATIONS:  None.  TOURNIQUET:  None.  DRAINS:  None.  SPECIMENS:  Humeral shaft lesion sent to pathology and microbiology.  PATIENT DISPOSITION:  To PACU.  CONDITION:  Stable.  BRIEF SUMMARY AND INDICATION FOR PROCEDURE:  Taylor Brown is a 66 year old female initially seen evaluated at [facility], where she was found to have a humeral shaft fracture resulting from a low injury mechanism.  This was presumed as the patient had a habit of significant alcohol abuse.  She was placed into a sling and swath and because of concerns about GI bleeding, underwent an upper endoscopy.  When she awoke from the procedure, she had loss of radial nerve function.  This was all of that.  The outside surgeon felt that this should be explored and possibly repaired if indicated.  Consequently, he deemed these injuries outside the scope of practice and  recommended followup with a fellowship trained orthopedic traumatologist and transfer of care to our facility.  IR was consulted.  I did discuss with the patient the risks and benefits of surgery.  Because of our concerns about alcohol withdrawal given her psychomotor agitation, we put her on the withdrawal protocol and waited 2 days for the procedure.  At this time, she has improved in mentation and clarity.  We were able to discuss the risks and benefits of surgery including the potential for failure to recover nerve function, the possibility of nerve repair, possibility of malunion, nonunion, and need for further surgery among others.  After we also discussed heart attack, stroke, and DTs, which could result in prolonged intubation and ICU stay.  She did wish to proceed.  BRIEF SUMMARY OF PROCEDURE:  The patient was taken to the operating room where general anesthesia was induced.  She was positioned on her right side with very careful attention turned to the right humerus.  Was placed into a splint when she entered our facility and this was protected.  We performed a standard prep and drape.  Posterior incision was used that we could adequately explore the radial nerve.  Long longitudinal incision after time-out was made.  Dissection carried down to the triceps,  which was reflected ulnarly.  The lateral triceps fascia was split along the lateral edge.  Dissection carried down carefully in the proximal aspect of the wound where the radial nerve was identified and traced to the intermuscular septum, which was divided to continue the radial nerve exploration.  It was intact, though there was a considerable amount of ecchymosis and soft tissue destruction adjacent to it at the level of the fracture.  The nerve was irrigated thoroughly. A complete neurolysis performed to maximize blood supply and minimize any pressure upon it.  It was traced proximally and mobilized over the shaft.  At  the fracture site, a 15 blade was used to tease the periosteum back from the edge.  It did seem to have a subacute component to it as it did not interdigitate 100%, but rather a 90% or more.  As a result of that, I was somewhat suspicious of a subacute etiology. Distally, we mobilized the triceps off the posterior aspect and posterolateral aspect of the distal humerus.  Using tenaculum, we were able to achieve curettage and lavage of the bone ends.  While addressing the distal segment, I encountered an amorphous mass, which was scooped out and sent to pathology and was concerning in gross appearance for tumor metastasis.  I continued with cleaning of the canal, removal of all this material, and a thorough irrigation of the bone ends.  The fracture was then interdigitated and secured with a posterolateral to anteromedial lag screw with good effect.  The Synthes long hybrid locking plate was then applied using standard fixation initially to further compress the fracture site and then locked fixation.  Because of the concern about a pathologic process, I did put additional fixation distally as there was a possibility that this fracture may not go on to heal.  The wound was irrigated thoroughly and closed in standard layered fashion using 0 Vicryl, 2-0 Vicryl, and 3-0 nylon.  Sterile gently compressive dressing was applied.  The patient was awakened from anesthesia and transported to the PACU in stable condition.  Ainsley Spinner, PA-C, assisted me throughout.  He was necessary to retract for the neurolysis and exploration of the radial nerve and to maintain reduction, so that we did not sustain any injury to it.  He also helped to maintain the reduction during provisional fixation and then definitive fixation.  He assisted with simultaneous wound closure as well.  It should be noted that prior to positioning her for the humerus, we began with the nail trimming.  Her nails were so overgrown that  they were thought to produce not only infection risk, fall risk, and it curled completely over the toe on several instances, a bone cutter was used to trim toes 1 through 5, being careful to avoid any bleeding or cracking of the nail proximally as I would not wish to produce a bacteremia or a fungemia that could jeopardize her humeral procedure. We were successful and debriding all these and then a chlorhexidine scrub was performed, removing additional dead skin and debris.  We did protect the operative field by placing a canopy of drapes over these toes during the trimming process as well.  After the procedure was complete, we were changing the patient's gown and moving her into a position where she could be transferred to the bed and at that time, we did identify a left breast lesion that appeared to emanate from a fixed mass within the breast and was producing some drainage.  Consequently, a General  Surgery consultation was obtained from Dr. 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  The patient was awakened from anesthesia and transported to PACU in stable condition.  PROGNOSIS:  I have discussed these findings with the patient's family, who are very loving and supportive.  They are very concerned about her overall well being not just because of the alcohol abuse, but also because of the condition of her home, which contains 12 dogs and per the family is akin to an episode of "hoarders."  They do not feel this environment is conducive to the recovery or maintenance of her health and the daughter has offered to take her into her home in [place].  As for the workup, we will obtain a consultation from Oncology and Dr. Lattie Haw.  We would expect a CT of the abdomen and pelvis.  We may consider additional studies to identify any bone lesions that  could require a surgical fixation prior to discharge particularly if the patient ends up going home, but this will be pending both OT and PT evaluations.  The radial nerve appears to be intact, and we would expect recovery in 95-96% of patients in the next 4 months. Radiation therapy may worsen the radial nerve recovery prognosis.     Astrid Divine. Marcelino Scot, M.D.   ______________________________ Astrid Divine. Marcelino Scot, M.D.    MHH/MEDQ  D:  12/25/2016  T:  12/25/2016  Job:  [id number]

## 2016-12-25 NOTE — Progress Notes (Signed)
Orthopaedic Trauma Service (OTS)  2 Days Post-Op Procedure(s) (LRB): OPEN REDUCTION INTERNAL FIXATION (ORIF) DISTAL HUMERUS FRACTURE (Right) TOENAIL TRIMMING (Bilateral)  Subjective: PATIENT OFF THE FLOOR FOR SEVERAL HOURS. CIRCLED BY X 2. Patient reports pain as improvedand more alert this am per nursing report.    Objective: Current Vitals Blood pressure 135/70, pulse 100, temperature 98.6 F (37 C), temperature source Oral, resp. rate 18, SpO2 93 %. Vital signs in last 24 hours: Temp:  [98.6 F (37 C)-99.6 F (37.6 C)] 98.6 F (37 C) (06/17 0708) Pulse Rate:  [97-100] 100 (06/17 0708) Resp:  [18] 18 (06/17 0708) BP: (107-135)/(53-70) 135/70 (06/17 0708) SpO2:  [93 %-97 %] 93 % (06/17 0708)  Intake/Output from previous day: 06/16 0701 - 06/17 0700 In: 240 [P.O.:240] Out: 1000 [Urine:1000]  LABS  Recent Labs  12/23/16 0355 12/23/16 2337  HGB 13.5 12.5    Recent Labs  12/23/16 0355 12/23/16 2337  WBC 7.3 8.0  RBC 3.89 3.64*  HCT 42.9 39.2  PLT 257 267    Recent Labs  12/23/16 1923 12/24/16 0716  NA 137 136  K 4.2 4.1  CL 101 99*  CO2 27 28  BUN 9 9  CREATININE 0.58 0.58  GLUCOSE 135* 113*  CALCIUM 8.9 8.6*   No results for input(s): LABPT, INR in the last 72 hours.   Physical Exam PATIENT OFF FLOOR CURRENTLY  Assessment/Plan: 2 Days Post-Op Procedure(s) (LRB): OPEN REDUCTION INTERNAL FIXATION (ORIF) DISTAL HUMERUS FRACTURE (Right) TOENAIL TRIMMING (Bilateral)  1. PT/OT NWB right upper 2. DVT proph Lovenox 3. Splint from OT tomorrow 4. Will pull foley but send follow up UA/ UCx to make sure UTI resolved 5. Appreciate medicine, oncologic and rad services 6. D/C planning; OT reported yesterday that patient was not suitable for d/c to home  Altamese Markham, [facility] [phone] (p)

## 2016-12-25 NOTE — Consult Note (Signed)
Reason for Consult:left breast cancer Referring Physician: Dr. Albin Felling is an 66 y.o. female.  HPI:  Pt is a 66 yo F who came to [facility] as a transfer from [facility] 12/21/2016.  She was throwing a bag of dog food and felt her arm snap.  She was found to have a right humerus fx with radial nerve injury.  She also had a GI bleed at that time.  She was disheveled upon presentation to [facility] and is noted to hoard extensively at home.  She has a significant drinking and smoking history.  She also has 12 yorkies at home.   This was found to be a pathologic fracture.  She was noted to have a left breast mass upon repositioning in the operating room.  Imaging shows multiple metastatic lesions in liver and bone as well as additional locations.     Past Medical History:  Diagnosis Date  . 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 Arthritis    osteo  . Cancer ([place])   . GERD (gastroesophageal reflux disease)   . Humerus fracture 12/19/2016   right  . Liver lesion 12/20/2016  . Nicotine dependence 12/20/2016    Past Surgical History:  Procedure Laterality Date  . ANKLE FRACTURE SURGERY  05/2012  . APPENDECTOMY    . ESOPHAGOGASTRODUODENOSCOPY N/A 12/18/2016   Procedure: ESOPHAGOGASTRODUODENOSCOPY (EGD);  Surgeon: Wilford Corner, MD;  Location: [facility] ENDOSCOPY;  Service: Endoscopy;  Laterality: N/A;  . ORIF ANKLE FRACTURE  05/12/2012   Procedure: OPEN REDUCTION INTERNAL FIXATION (ORIF) ANKLE FRACTURE;  Surgeon: Wylene Simmer, MD;  Location: [facility];  Service: Orthopedics;  Laterality: Left;  . ORIF WRIST FRACTURE  05/12/2012   Procedure: OPEN REDUCTION INTERNAL FIXATION (ORIF) WRIST FRACTURE;  Surgeon: Mcarthur Rossetti, MD;  Location: [facility];  Service: Orthopedics;  Laterality: Left;  . TUBAL LIGATION    . WRIST FRACTURE SURGERY  05/2012    Family History  Problem Relation Age of Onset  . Hypertension Mother     Social History:  reports that she has been smoking  Cigarettes.  She has a 60.00 pack-year smoking history. She has never used smokeless tobacco. She reports that she drinks alcohol. She reports that she does not use drugs.  Allergies:  Allergies  Allergen Reactions  . Ativan [Lorazepam] Other (See Comments)    Makes confused  . No Known Allergies     Medications:  Prior to Admission:  Prescriptions Prior to Admission  Medication Sig Dispense Refill Last Dose  . amLODipine (NORVASC) 5 MG tablet Take 1 tablet (5 mg total) by mouth daily. 30 tablet 0   . docusate sodium 100 MG CAPS Take 100 mg by mouth 2 (two) times daily. (Patient not taking: Reported on 12/17/2016) 30 capsule 0 Completed Course at Unknown time  . esomeprazole (NEXIUM) 20 MG capsule Take 20 mg by mouth as needed.   prn at prn  . loratadine (CLARITIN) 10 MG tablet Take 10 mg by mouth daily as needed. For allergies   prn at prn  . metoprolol tartrate (LOPRESSOR) 25 MG tablet Take 1 tablet (25 mg total) by mouth 2 (two) times daily. 60 tablet 0   . Multiple Vitamin (MULTIVITAMIN WITH MINERALS) TABS Take 1 tablet by mouth daily. 30 tablet 0   . oxyCODONE (OXY IR/ROXICODONE) 5 MG immediate release tablet Take 1-2 tablets (5-10 mg total) by mouth every 3 (three) hours as needed. (Patient not taking: Reported on 12/17/2016) 40 tablet  0 Completed Course at Unknown time  . pantoprazole (PROTONIX) 40 MG tablet Take 1 tablet (40 mg total) by mouth 2 (two) times daily before a meal. 60 tablet 1   . senna (SENOKOT) 8.6 MG TABS Take 1 tablet (8.6 mg total) by mouth 2 (two) times daily. (Patient not taking: Reported on 12/17/2016) 60 tablet 0 Completed Course at Unknown time  . [DISCONTINUED] naproxen sodium (ANAPROX) 220 MG tablet Take 220 mg by mouth 2 (two) times daily with a meal.   prn at prn    Results for orders placed or performed during the hospital encounter of 12/19/16 (from the past 48 hour(s))  Cancer antigen 27.29     Status: None   Collection Time: 12/23/16  7:23 PM  Result  Value Ref Range   CA 27.29 23.9 0.0 - 38.6 U/mL    Comment: (NOTE) [employer] Cent[employer]/ACS methodology Performed At: [facility] [place], [place] [id number] Lindon Romp MD PO:[id number]   AFP tumor marker     Status: None   Collection Time: 12/23/16  7:23 PM  Result Value Ref Range   AFP-Tumor Marker 4.7 0.0 - 8.3 ng/mL    Comment: (NOTE) Roche ECLIA methodology Performed At: [facility] [place], [place] [id number] Lindon Romp MD YP:[id number]   Basic metabolic panel     Status: Abnormal   Collection Time: 12/23/16  7:23 PM  Result Value Ref Range   Sodium 137 135 - 145 mmol/L   Potassium 4.2 3.5 - 5.1 mmol/L   Chloride 101 101 - 111 mmol/L   CO2 27 22 - 32 mmol/L   Glucose, Bld 135 (H) 65 - 99 mg/dL   BUN 9 6 - 20 mg/dL   Creatinine, Ser 0.58 0.44 - 1.00 mg/dL   Calcium 8.9 8.9 - 10.3 mg/dL   GFR calc non Af Amer >60 >60 mL/min   GFR calc Af Amer >60 >60 mL/min    Comment: (NOTE) The eGFR has been calculated using the CKD EPI equation. This calculation has not been validated in all clinical situations. eGFR's persistently <60 mL/min signify possible Chronic Kidney Disease.    Anion gap 9 5 - 15  CBC     Status: Abnormal   Collection Time: 12/23/16 11:37 PM  Result Value Ref Range   WBC 8.0 4.0 - 10.5 K/uL   RBC 3.64 (L) 3.87 - 5.11 MIL/uL   Hemoglobin 12.5 12.0 - 15.0 g/dL   HCT 39.2 36.0 - 46.0 %   MCV 107.7 (H) 78.0 - 100.0 fL   MCH 34.3 (H) 26.0 - 34.0 pg   MCHC 31.9 30.0 - 36.0 g/dL   RDW 14.8 11.5 - 15.5 %   Platelets 267 150 - 400 K/uL  Basic metabolic panel     Status: Abnormal   Collection Time: 12/24/16  7:16 AM  Result Value Ref Range   Sodium 136 135 - 145 mmol/L   Potassium 4.1 3.5 - 5.1 mmol/L   Chloride 99 (L) 101 - 111 mmol/L   CO2 28 22 - 32 mmol/L   Glucose, Bld 113 (H) 65 - 99 mg/dL   BUN 9 6 - 20 mg/dL   Creatinine, Ser 0.58 0.44 - 1.00 mg/dL   Calcium 8.6 (L) 8.9 - 10.3 mg/dL   GFR  calc non Af Amer >60 >60 mL/min   GFR calc Af Amer >60 >60 mL/min    Comment: (NOTE) The eGFR has been calculated using the CKD EPI equation.  This calculation has not been validated in all clinical situations. eGFR's persistently <60 mL/min signify possible Chronic Kidney Disease.    Anion gap 9 5 - 15  Magnesium     Status: None   Collection Time: 12/24/16  7:16 AM  Result Value Ref Range   Magnesium 1.8 1.7 - 2.4 mg/dL    Ct Chest W Contrast  Result Date: 12/24/2016 CLINICAL DATA:  Inpatient admitted with pathologic right humerus fracture and left breast mass. Suspected metastatic breast cancer. Staging. EXAM: CT CHEST, ABDOMEN, AND PELVIS WITH CONTRAST TECHNIQUE: Multidetector CT imaging of the chest, abdomen and pelvis was performed following the standard protocol during bolus administration of intravenous contrast. CONTRAST:  1107m ISOVUE-300 IOPAMIDOL (ISOVUE-300) INJECTION 61% COMPARISON:  12/17/2016 chest CT. FINDINGS: CT CHEST FINDINGS Cardiovascular: Top-normal heart size. No significant pericardial fluid/thickening. Atherosclerotic nonaneurysmal thoracic aorta. Normal caliber pulmonary arteries. No central pulmonary emboli. Mediastinum/Nodes: No discrete thyroid nodules. Unremarkable esophagus. No pathologically enlarged axillary, mediastinal or hilar lymph nodes. Lungs/Pleura: No pneumothorax. No pleural effusion. Right middle lobe 5 mm (series 4/image 81) and 4 mm lingular (series 4/image 68) solid pulmonary nodules, both stable since 12/17/2016. Subsegmental atelectasis in the dependent lungs bilaterally. Otherwise no acute consolidative airspace disease, lung masses or additional significant pulmonary nodules. Musculoskeletal: Expansile lytic destructive bone lesion at the right third costovertebral junction (series 4/image 20) with mass-effect on the right thoracic spinal canal. Healed deformities of the lateral left seventh and eighth ribs. Partially visualized surgical fixation of  the right distal humerus. Marked thoracic spondylosis . Irregular solid 5.3 x 2.8 cm outer left breast mass, extending to the skin (series 3/image 30). CT ABDOMEN PELVIS FINDINGS Hepatobiliary: Diffuse hepatic steatosis. The known liver masses (at least 4) in the liver seen on the 12/17/2016 chest CT study are poorly visualized on the routine phase sequence through the liver on today's scan, probably due to differences in contrast timing. Representative liver masses include a 1.7 x 1.5 cm liver dome lesion (series 3/ image 43), a 1.5 x 1.1 cm segment 4B left liver lobe lesion (series 8/ image 7) and a 0.9 x 0.7 cm segment 3 left liver lobe lesion (series 8/image 8). Normal gallbladder with no radiopaque cholelithiasis. No biliary ductal dilatation. Pancreas: Normal, with no mass or duct dilation. Spleen: Normal size. No mass. Adrenals/Urinary Tract: Suggestion of an indeterminate 1.2 cm lower left adrenal nodule (series 3/ image 54) with density 875 HU. No additional discrete adrenal nodules. No hydronephrosis. Mild segmental renal cortical scarring in the upper right kidney. No renal mass. Bladder collapsed by indwelling Foley catheter, limiting bladder evaluation. Grossly normal bladder. Stomach/Bowel: Grossly normal stomach. Normal caliber small bowel with no small bowel wall thickening. Appendectomy. Normal large bowel with no diverticulosis, large bowel wall thickening or pericolonic fat stranding. Vascular/Lymphatic: Atherosclerotic nonaneurysmal abdominal aorta. Patent portal, splenic, hepatic and renal veins. No pathologically enlarged lymph nodes in the abdomen or pelvis. Reproductive: Suggestion of abnormal endometrial thickening (approximately 10 mm). No adnexal mass. Other: No pneumoperitoneum, ascites or focal fluid collection. Small fat containing umbilical hernia. Musculoskeletal: No aggressive appearing focal osseous lesions. Mild L3 and L4 superior vertebral compression fractures of indeterminate  chronicity. Moderate lumbar spondylosis . IMPRESSION: 1. Irregular solid 5.3 cm upper left breast mass extending to the skin, suspicious for primary left breast malignancy. Recommend correlation with diagnostic mammographic evaluation. 2. Expansile lytic destructive bone lesion at the right third costovertebral junction with mass-effect on the right thoracic spinal canal, compatible with osseous metastasis. Consider correlation  with thoracic spine MRI without and with IV contrast to evaluate for possible thoracic cord compression, as clinically warranted . 3. Two subcentimeter pulmonary nodules, indeterminate for metastases. Recommend attention on follow-up chest CT in 3 months. 4. Four scattered small liver masses, suspicious for liver metastases. These could be further evaluated with MRI abdomen without and with IV contrast versus PET-CT, as clinically warranted . 5. Possible small left adrenal metastasis. 6. Suggestion of abnormal endometrial thickening (10 mm). Recommend correlation would transabdominal and transvaginal pelvic ultrasound. 7. Mild L3 and L4 vertebral compression fractures of indeterminate chronicity. 8. Additional chronic findings as above. Electronically Signed   By: Ilona Sorrel M.D.   On: 12/24/2016 18:07   Mr Cervical Spine W Wo Contrast  Result Date: 12/25/2016 CLINICAL DATA:  Metastatic disease.  Breast mass. EXAM: MRI TOTAL SPINE WITHOUT AND WITH CONTRAST TECHNIQUE: Multisequence MR imaging of the spine from the cervical spine to the sacrum was performed prior to and following IV contrast administration for evaluation of spinal metastatic disease. CONTRAST:  24m MULTIHANCE GADOBENATE DIMEGLUMINE 529 MG/ML IV SOLN COMPARISON:  CT chest abdomen pelvis 12/24/2016 FINDINGS: MRI CERVICAL SPINE FINDINGS Image quality degraded by motion. Alignment: Normal Vertebrae: No fracture identified. Enhancing lesion C7 vertebral body compatible with metastatic disease. No epidural extension. T3  metastatic disease also noted. Cord: Negative for cord compression.  Cord signal normal. Posterior Fossa, vertebral arteries, paraspinal tissues: Negative Disc levels: Mild disc degeneration at C4-5 and C5-6 and C6-7. Mild facet degeneration on the left at C4-5. Left foraminal narrowing C5-6 due to disc protrusion and spurring. MRI THORACIC SPINE FINDINGS Alignment:  Normal Vertebrae: Metastatic disease throughout the T3 vertebral body extending into the pedicle and posterior elements. There is also tumor in the right foramen at T2-3 and T3-4 which could cause radicular symptoms. Slight epidural tumor without cord compression. Tumor also present in the spinous processes of T1, T2, T3, T8. Small tumor deposit T5 vertebral body and right superior articulating facet. Tumor in the right sixth rib. Small enhancing lesions T12 vertebral body compatible with metastatic disease. Accentuated dorsal kyphosis.  No pathologic fracture Cord:  Negative for cord compression.  Spinal cord signal normal. Paraspinal and other soft tissues: Negative Disc levels: Multilevel thoracic disc degeneration without significant spinal stenosis. MRI LUMBAR SPINE FINDINGS Segmentation:  Normal Alignment:  Normal Vertebrae: Enhancing metastatic disease is seen in the lumbar spine. Enhancing lesion on the right at L2. Diffuse vertebral body enhancement of L3 with pathologic fracture. Enhancing tumor extends in the pedicle and posterior elements on the right without foraminal encroachment. Small enhancing lesion S2 on the left. Mild fracture of L4 appears chronic and benign. Conus medullaris: Extends to the L1-2 level and appears normal. Paraspinal and other soft tissues: Diffuse paraspinous muscle atrophy. No retroperitoneal adenopathy Disc levels: L1-2:  Negative L2-3:  Disc and facet degeneration with moderate spinal stenosis L3-4:  Disc and facet degeneration with severe spinal stenosis L4-5:  Disc and facet degeneration with moderate spinal  stenosis L5-S1:  Severe facet degeneration without stenosis. IMPRESSION: Metastatic disease C7 vertebral body without epidural tumor or fracture Multiple metastatic deposits in the thoracic and lumbar spine. Prominent tumor is present on the right at T3 extending into the soft tissues as well as the foramina on the right at T2-3 and T3-4. See above detailed Advanced lumbar degenerative changes with spinal stenosis at L2-3, L3-4, L4-5 Electronically Signed   By: CFranchot GalloM.D.   On: 12/25/2016 11:51   Mr Thoracic Spine  W Wo Contrast  Result Date: 12/25/2016 CLINICAL DATA:  Metastatic disease.  Breast mass. EXAM: MRI TOTAL SPINE WITHOUT AND WITH CONTRAST TECHNIQUE: Multisequence MR imaging of the spine from the cervical spine to the sacrum was performed prior to and following IV contrast administration for evaluation of spinal metastatic disease. CONTRAST:  55m MULTIHANCE GADOBENATE DIMEGLUMINE 529 MG/ML IV SOLN COMPARISON:  CT chest abdomen pelvis 12/24/2016 FINDINGS: MRI CERVICAL SPINE FINDINGS Image quality degraded by motion. Alignment: Normal Vertebrae: No fracture identified. Enhancing lesion C7 vertebral body compatible with metastatic disease. No epidural extension. T3 metastatic disease also noted. Cord: Negative for cord compression.  Cord signal normal. Posterior Fossa, vertebral arteries, paraspinal tissues: Negative Disc levels: Mild disc degeneration at C4-5 and C5-6 and C6-7. Mild facet degeneration on the left at C4-5. Left foraminal narrowing C5-6 due to disc protrusion and spurring. MRI THORACIC SPINE FINDINGS Alignment:  Normal Vertebrae: Metastatic disease throughout the T3 vertebral body extending into the pedicle and posterior elements. There is also tumor in the right foramen at T2-3 and T3-4 which could cause radicular symptoms. Slight epidural tumor without cord compression. Tumor also present in the spinous processes of T1, T2, T3, T8. Small tumor deposit T5 vertebral body and right  superior articulating facet. Tumor in the right sixth rib. Small enhancing lesions T12 vertebral body compatible with metastatic disease. Accentuated dorsal kyphosis.  No pathologic fracture Cord:  Negative for cord compression.  Spinal cord signal normal. Paraspinal and other soft tissues: Negative Disc levels: Multilevel thoracic disc degeneration without significant spinal stenosis. MRI LUMBAR SPINE FINDINGS Segmentation:  Normal Alignment:  Normal Vertebrae: Enhancing metastatic disease is seen in the lumbar spine. Enhancing lesion on the right at L2. Diffuse vertebral body enhancement of L3 with pathologic fracture. Enhancing tumor extends in the pedicle and posterior elements on the right without foraminal encroachment. Small enhancing lesion S2 on the left. Mild fracture of L4 appears chronic and benign. Conus medullaris: Extends to the L1-2 level and appears normal. Paraspinal and other soft tissues: Diffuse paraspinous muscle atrophy. No retroperitoneal adenopathy Disc levels: L1-2:  Negative L2-3:  Disc and facet degeneration with moderate spinal stenosis L3-4:  Disc and facet degeneration with severe spinal stenosis L4-5:  Disc and facet degeneration with moderate spinal stenosis L5-S1:  Severe facet degeneration without stenosis. IMPRESSION: Metastatic disease C7 vertebral body without epidural tumor or fracture Multiple metastatic deposits in the thoracic and lumbar spine. Prominent tumor is present on the right at T3 extending into the soft tissues as well as the foramina on the right at T2-3 and T3-4. See above detailed Advanced lumbar degenerative changes with spinal stenosis at L2-3, L3-4, L4-5 Electronically Signed   By: CFranchot GalloM.D.   On: 12/25/2016 11:51   Mr Lumbar Spine W Wo Contrast  Result Date: 12/25/2016 CLINICAL DATA:  Metastatic disease.  Breast mass. EXAM: MRI TOTAL SPINE WITHOUT AND WITH CONTRAST TECHNIQUE: Multisequence MR imaging of the spine from the cervical spine to the  sacrum was performed prior to and following IV contrast administration for evaluation of spinal metastatic disease. CONTRAST:  212mMULTIHANCE GADOBENATE DIMEGLUMINE 529 MG/ML IV SOLN COMPARISON:  CT chest abdomen pelvis 12/24/2016 FINDINGS: MRI CERVICAL SPINE FINDINGS Image quality degraded by motion. Alignment: Normal Vertebrae: No fracture identified. Enhancing lesion C7 vertebral body compatible with metastatic disease. No epidural extension. T3 metastatic disease also noted. Cord: Negative for cord compression.  Cord signal normal. Posterior Fossa, vertebral arteries, paraspinal tissues: Negative Disc levels: Mild disc degeneration at  C4-5 and C5-6 and C6-7. Mild facet degeneration on the left at C4-5. Left foraminal narrowing C5-6 due to disc protrusion and spurring. MRI THORACIC SPINE FINDINGS Alignment:  Normal Vertebrae: Metastatic disease throughout the T3 vertebral body extending into the pedicle and posterior elements. There is also tumor in the right foramen at T2-3 and T3-4 which could cause radicular symptoms. Slight epidural tumor without cord compression. Tumor also present in the spinous processes of T1, T2, T3, T8. Small tumor deposit T5 vertebral body and right superior articulating facet. Tumor in the right sixth rib. Small enhancing lesions T12 vertebral body compatible with metastatic disease. Accentuated dorsal kyphosis.  No pathologic fracture Cord:  Negative for cord compression.  Spinal cord signal normal. Paraspinal and other soft tissues: Negative Disc levels: Multilevel thoracic disc degeneration without significant spinal stenosis. MRI LUMBAR SPINE FINDINGS Segmentation:  Normal Alignment:  Normal Vertebrae: Enhancing metastatic disease is seen in the lumbar spine. Enhancing lesion on the right at L2. Diffuse vertebral body enhancement of L3 with pathologic fracture. Enhancing tumor extends in the pedicle and posterior elements on the right without foraminal encroachment. Small  enhancing lesion S2 on the left. Mild fracture of L4 appears chronic and benign. Conus medullaris: Extends to the L1-2 level and appears normal. Paraspinal and other soft tissues: Diffuse paraspinous muscle atrophy. No retroperitoneal adenopathy Disc levels: L1-2:  Negative L2-3:  Disc and facet degeneration with moderate spinal stenosis L3-4:  Disc and facet degeneration with severe spinal stenosis L4-5:  Disc and facet degeneration with moderate spinal stenosis L5-S1:  Severe facet degeneration without stenosis. IMPRESSION: Metastatic disease C7 vertebral body without epidural tumor or fracture Multiple metastatic deposits in the thoracic and lumbar spine. Prominent tumor is present on the right at T3 extending into the soft tissues as well as the foramina on the right at T2-3 and T3-4. See above detailed Advanced lumbar degenerative changes with spinal stenosis at L2-3, L3-4, L4-5 Electronically Signed   By: Franchot Gallo M.D.   On: 12/25/2016 11:51   Ct Abdomen Pelvis W Contrast  Result Date: 12/24/2016 CLINICAL DATA:  Inpatient admitted with pathologic right humerus fracture and left breast mass. Suspected metastatic breast cancer. Staging. EXAM: CT CHEST, ABDOMEN, AND PELVIS WITH CONTRAST TECHNIQUE: Multidetector CT imaging of the chest, abdomen and pelvis was performed following the standard protocol during bolus administration of intravenous contrast. CONTRAST:  112m ISOVUE-300 IOPAMIDOL (ISOVUE-300) INJECTION 61% COMPARISON:  12/17/2016 chest CT. FINDINGS: CT CHEST FINDINGS Cardiovascular: Top-normal heart size. No significant pericardial fluid/thickening. Atherosclerotic nonaneurysmal thoracic aorta. Normal caliber pulmonary arteries. No central pulmonary emboli. Mediastinum/Nodes: No discrete thyroid nodules. Unremarkable esophagus. No pathologically enlarged axillary, mediastinal or hilar lymph nodes. Lungs/Pleura: No pneumothorax. No pleural effusion. Right middle lobe 5 mm (series 4/image 81) and  4 mm lingular (series 4/image 68) solid pulmonary nodules, both stable since 12/17/2016. Subsegmental atelectasis in the dependent lungs bilaterally. Otherwise no acute consolidative airspace disease, lung masses or additional significant pulmonary nodules. Musculoskeletal: Expansile lytic destructive bone lesion at the right third costovertebral junction (series 4/image 20) with mass-effect on the right thoracic spinal canal. Healed deformities of the lateral left seventh and eighth ribs. Partially visualized surgical fixation of the right distal humerus. Marked thoracic spondylosis . Irregular solid 5.3 x 2.8 cm outer left breast mass, extending to the skin (series 3/image 30). CT ABDOMEN PELVIS FINDINGS Hepatobiliary: Diffuse hepatic steatosis. The known liver masses (at least 4) in the liver seen on the 12/17/2016 chest CT study are poorly visualized on  the routine phase sequence through the liver on today's scan, probably due to differences in contrast timing. Representative liver masses include a 1.7 x 1.5 cm liver dome lesion (series 3/ image 43), a 1.5 x 1.1 cm segment 4B left liver lobe lesion (series 8/ image 7) and a 0.9 x 0.7 cm segment 3 left liver lobe lesion (series 8/image 8). Normal gallbladder with no radiopaque cholelithiasis. No biliary ductal dilatation. Pancreas: Normal, with no mass or duct dilation. Spleen: Normal size. No mass. Adrenals/Urinary Tract: Suggestion of an indeterminate 1.2 cm lower left adrenal nodule (series 3/ image 54) with density 875 HU. No additional discrete adrenal nodules. No hydronephrosis. Mild segmental renal cortical scarring in the upper right kidney. No renal mass. Bladder collapsed by indwelling Foley catheter, limiting bladder evaluation. Grossly normal bladder. Stomach/Bowel: Grossly normal stomach. Normal caliber small bowel with no small bowel wall thickening. Appendectomy. Normal large bowel with no diverticulosis, large bowel wall thickening or pericolonic  fat stranding. Vascular/Lymphatic: Atherosclerotic nonaneurysmal abdominal aorta. Patent portal, splenic, hepatic and renal veins. No pathologically enlarged lymph nodes in the abdomen or pelvis. Reproductive: Suggestion of abnormal endometrial thickening (approximately 10 mm). No adnexal mass. Other: No pneumoperitoneum, ascites or focal fluid collection. Small fat containing umbilical hernia. Musculoskeletal: No aggressive appearing focal osseous lesions. Mild L3 and L4 superior vertebral compression fractures of indeterminate chronicity. Moderate lumbar spondylosis . IMPRESSION: 1. Irregular solid 5.3 cm upper left breast mass extending to the skin, suspicious for primary left breast malignancy. Recommend correlation with diagnostic mammographic evaluation. 2. Expansile lytic destructive bone lesion at the right third costovertebral junction with mass-effect on the right thoracic spinal canal, compatible with osseous metastasis. Consider correlation with thoracic spine MRI without and with IV contrast to evaluate for possible thoracic cord compression, as clinically warranted . 3. Two subcentimeter pulmonary nodules, indeterminate for metastases. Recommend attention on follow-up chest CT in 3 months. 4. Four scattered small liver masses, suspicious for liver metastases. These could be further evaluated with MRI abdomen without and with IV contrast versus PET-CT, as clinically warranted . 5. Possible small left adrenal metastasis. 6. Suggestion of abnormal endometrial thickening (10 mm). Recommend correlation would transabdominal and transvaginal pelvic ultrasound. 7. Mild L3 and L4 vertebral compression fractures of indeterminate chronicity. 8. Additional chronic findings as above. Electronically Signed   By: Ilona Sorrel M.D.   On: 12/24/2016 18:07   Dg Chest Port 1 View  Result Date: 12/23/2016 CLINICAL DATA:  Shortness of breath EXAM: PORTABLE CHEST 1 VIEW COMPARISON:  12/17/2016 FINDINGS: Linear  atelectasis at the medial left base. Cannot exclude small left pleural effusion. Mild cardiomegaly with atherosclerosis. No overt pulmonary edema. No pneumothorax. Old left rib fracture. IMPRESSION: 1. Cardiomegaly without edema or infiltrate 2. Subsegmental atelectasis at the left lung base. Electronically Signed   By: Donavan Foil M.D.   On: 12/23/2016 19:08   Dg Bone Survey Met  Result Date: 12/23/2016 CLINICAL DATA:  (Breast lesion. Pathologic fracture of the right humerus. Status post ORIF of the right humerus. Evaluate for metastasis. EXAM: METASTATIC BONE SURVEY COMPARISON:  CXR and right humerus radiographs from 12/17/2016, left ankle radiographs from 05/11/2012, left wrist radiographs small 05/11/2012 FINDINGS: Lateral skull: Occipital lytic skull lucency measuring 13 x 8 mm. No fracture. Both shoulders: Negative for lytic or blastic disease. No acute fracture nor dislocation. Left old sixth and seventh rib fractures are incidentally included. Both humeri: Pathologic fracture through moth eaten lytic lesion of the distal right humeral diaphysis transfixed by plate  and screws. Lesion at the junction of the middle and distal third of the humeral shaft. Left humerus is negative. Radius and ulna: Negative for lytic or blastic disease. Volar plate and screw fixation across distal left radius fracture. Cervical spine AP and lateral: Degenerative disc and facet arthropathy. No suspicious osseous abnormality. Thoracic spine: Multilevel degenerative disc and endplate changes consistent with spondylosis. Lumbar spine: Chronic appearing superior endplate compressions of L1, L3 and L4 without retropulsion. No lytic or blastic disease. Lower lumbar facet arthropathy from L3 through S1. Slight disc space narrowing at L5-S1. PA chest: Normal size heart. Aortic atherosclerosis. Atelectasis at the lung bases. No dominant mass. Elevation of the right hemidiaphragm versus eventration. Old left-sided rib fractures. AP  pelvis: Lower lumbar facet arthropathy. Intact SI joints and pubic symphysis with osteoarthritis of the pubic symphysis. Old right inferior pubic ramus fracture. No suspicious lytic or blastic disease of the pelvis and included hips. Both femora, tibia and fibula: Negative for lytic or blastic disease. ORIF of left bimalleolar fractures. IMPRESSION: 13 x 8 mm lytic focus involving the occiput of the skull which could be a normal variant such is an arachnoid granulation. CT may help for further correlation. No additional lytic or blastic disease are apparent. Pathologic fracture transfixed by plate and screws involving the distal right humeral shaft. ORIF of the distal left radius and left bimalleolar fractures are also noted. Cervical, thoracic and lumbar spondylosis. Chronic benign-appearing endplate compressions of L1, L3 and L4. Electronically Signed   By: Ashley Royalty M.D.   On: 12/23/2016 17:11   Dg Humerus Right  Result Date: 12/23/2016 CLINICAL DATA:  OPEN REDUCTION INTERNAL FIXATION (ORIF) DISTAL HUMERUS FRACTURE (Right Arm Upper). Radiation Safety Timeout performed by BIW. Fluoro time 12 seconds. EXAM: DG C-ARM 61-120 MIN; RIGHT HUMERUS - 2+ VIEW COMPARISON:  12/20/2016 FINDINGS: Patient has undergone screw plate fixation of right humerus fracture. A cortical screw traverses the fracture site. There is near anatomic alignment. Elbow is unremarkable in appearance. IMPRESSION: Status post ORIF of the right humerus. Lucency at the fracture site, raising the question of pathologic fracture. Correlation with history is recommended. Electronically Signed   By: Nolon Nations M.D.   On: 12/23/2016 13:53   Dg C-arm 1-60 Min  Result Date: 12/23/2016 CLINICAL DATA:  OPEN REDUCTION INTERNAL FIXATION (ORIF) DISTAL HUMERUS FRACTURE (Right Arm Upper). Radiation Safety Timeout performed by BIW. Fluoro time 12 seconds. EXAM: DG C-ARM 61-120 MIN; RIGHT HUMERUS - 2+ VIEW COMPARISON:  12/20/2016 FINDINGS: Patient has  undergone screw plate fixation of right humerus fracture. A cortical screw traverses the fracture site. There is near anatomic alignment. Elbow is unremarkable in appearance. IMPRESSION: Status post ORIF of the right humerus. Lucency at the fracture site, raising the question of pathologic fracture. Correlation with history is recommended. Electronically Signed   By: Nolon Nations M.D.   On: 12/23/2016 13:53    Review of Systems  All other systems reviewed and are negative.  Blood pressure 135/70, pulse 100, temperature 98.6 F (37 C), temperature source Oral, resp. rate 18, SpO2 93 %. Physical Exam  Constitutional: She is oriented to person, place, and time. She appears well-developed and well-nourished. No distress.  HENT:  Head: Normocephalic and atraumatic.  Eyes: Conjunctivae are normal. Pupils are equal, round, and reactive to light. Right eye exhibits no discharge. Left eye exhibits no discharge. No scleral icterus.  Neck: Normal range of motion. Neck supple.  Cardiovascular: Normal rate, regular rhythm, normal heart sounds and intact distal  pulses.   Respiratory: Effort normal and breath sounds normal. No respiratory distress. Left breast exhibits mass and skin change.    Left breast mass 5-6 cm.  Mobile.  + LAD in axilla. Left breast decreased size from opposite side.  Some skin breakdown about the size of a nickel overlying mass.    GI: Soft. She exhibits no distension. There is no tenderness.  Neurological: She is alert and oriented to person, place, and time.  Skin: Skin is warm and dry. No rash noted. She is not diaphoretic. No erythema. No pallor.  Psychiatric: She has a normal mood and affect. Her behavior is normal. Judgment and thought content normal.    Assessment/Plan: Metastatic left breast cancer to multiple locations.  Oncology seeing and will direct care. Recommend u/s guided breast biopsy by radiology. Will not likely proceed to breast surgery unless she has a  dramatic lasting response to chemo.    Call for further needs.    Jaimya Feliciano 12/25/2016, 12:13 PM

## 2016-12-25 NOTE — Op Note (Deleted)
  The note originally documented on this encounter has been moved the the encounter in which it belongs.  

## 2016-12-25 NOTE — Evaluation (Signed)
Physical Therapy Evaluation Patient Details Name: Taylor Brown MRN: [id number] DOB: [birth year] Today's Date: 12/25/2016   History of Present Illness  Pt is a 66 y.o. female presenting with R distal humerus fracture with acute radial nerve injury s/p fall. During hospital admission pt was found to have antral gastritis, liver lesions, and L breast mass with possible metastatic breast cancer. Pt is now s/p ORIF R distal humerus fracture and exploration of radial nerve on 6/15. PMHx: Arthritis, GERD, ORIF L ankle fx, ORIF L wrist fx.  Clinical Impression   Pt admitted with above diagnosis. Pt currently with functional limitations due to the deficits listed below (see PT Problem List). Unsteady on feet, needing mod assist for most functional mobility activities;  Pt will benefit from skilled PT to increase their independence and safety with mobility to allow discharge to the venue listed below.       Follow Up Recommendations SNF    Equipment Recommendations  Other (comment) (to be determined)    Recommendations for Other Services       Precautions / Restrictions Precautions Precautions: Fall Precaution Comments: No active abduction R shoulder. [place] for passive abduction R shoulder. [place] for active and passive R shoulder flexion. Unrestricted ROM R elbow, forearm, wrist and hand. 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  Required Braces or Orthoses: Other Brace/Splint Other Brace/Splint: R pre-fab wrist cock up splint Restrictions RUE Weight Bearing: Non weight bearing      Mobility  Bed Mobility Overal bed mobility: Needs Assistance Bed Mobility: Supine to Sit     Supine to sit: Min assist;HOB elevated     General bed mobility comments: increased time, HOB elevated, with use of bed rails. Assist provided to support RUE and make sure she maintains NWB  Transfers Overall transfer level: Needs assistance Equipment used: 2 person hand held assist Transfers:  Sit to/from Stand Sit to Stand: Min assist         General transfer comment: Assist to support RUE and for balance with sit to stand from EOB  Ambulation/Gait Ambulation/Gait assistance: Min assist;+2 safety/equipment Ambulation Distance (Feet): 25 Feet Assistive device: 2 person hand held assist Gait Pattern/deviations: Step-through pattern;Decreased step length - right;Decreased step length - left;Decreased stride length   Gait velocity interpretation: Below normal speed for age/gender General Gait Details: Cues to self-monitor for activity tolerance; Dependent on LUE support for steadiness; numerous cues for NWB RUE (tended to try to get hallway rail with RUE)  Stairs            Wheelchair Mobility    Modified Rankin (Stroke Patients Only)       Balance     Sitting balance-Leahy Scale: Fair       Standing balance-Leahy Scale: Poor                               Pertinent Vitals/Pain Pain Assessment: No/denies pain Faces Pain Scale: Hurts a little bit Pain Location: RUE, mainly elbow area Pain Descriptors / Indicators: Aching;Grimacing;Guarding Pain Intervention(s): Monitored during session    Home Living Family/patient expects to be discharged to:: Private residence Living Arrangements: Alone   Type of Home: Mobile home Home Access: Stairs to enter Entrance Stairs-Rails: Psychiatric nurse of Steps: 2 Home Layout: One level Home Equipment: Walker - standard;Other (comment) (uses lawn chair as shower chair)      Prior Function  Comments: intermittent use of walker, reports she is independent with ADL     Hand Dominance   Dominant Hand: Right    Extremity/Trunk Assessment   Upper Extremity Assessment Upper Extremity Assessment: Defer to OT evaluation    Lower Extremity Assessment Lower Extremity Assessment: Generalized weakness    Cervical / Trunk Assessment Cervical / Trunk Assessment: Kyphotic   Communication   Communication: No difficulties  Cognition Arousal/Alertness: Awake/alert Behavior During Therapy: WFL for tasks assessed/performed Overall Cognitive Status: Within Functional Limits for tasks assessed                                        General Comments      Exercises     Assessment/Plan    PT Assessment Patient needs continued PT services  PT Problem List Decreased strength;Decreased activity tolerance;Decreased balance;Decreased mobility;Decreased coordination;Decreased cognition;Decreased knowledge of use of DME;Decreased safety awareness;Decreased knowledge of precautions;Pain       PT Treatment Interventions DME instruction;Gait training;Stair training;Functional mobility training;Therapeutic activities;Therapeutic exercise;Balance training;Neuromuscular re-education;Cognitive remediation;Patient/family education    PT Goals (Current goals can be found in the Care Plan section)  Acute Rehab PT Goals Patient Stated Goal: to get better PT Goal Formulation: With patient Time For Goal Achievement: 01/08/17 Potential to Achieve Goals: Good    Frequency Min 3X/week   Barriers to discharge Decreased caregiver support      Co-evaluation               AM-PAC PT "6 Clicks" Daily Activity  Outcome Measure Difficulty turning over in bed (including adjusting bedclothes, sheets and blankets)?: Total Difficulty moving from lying on back to sitting on the side of the bed? : Total Difficulty sitting down on and standing up from a chair with arms (e.g., wheelchair, bedside commode, etc,.)?: Total Help needed moving to and from a bed to chair (including a wheelchair)?: A Lot Help needed walking in hospital room?: A Lot Help needed climbing 3-5 steps with a railing? : Total 6 Click Score: 8    End of Session Equipment Utilized During Treatment: Gait belt Activity Tolerance: Patient tolerated treatment well Patient left: in chair;with call  bell/phone within reach Nurse Communication: Mobility status PT Visit Diagnosis: Unsteadiness on feet (R26.81);Muscle weakness (generalized) (M62.81);Other abnormalities of gait and mobility (R26.89)    Time: 9179-1505 PT Time Calculation (min) (ACUTE ONLY): 13 min   Charges:   PT Evaluation $PT Eval Moderate Complexity: 1 Procedure     PT G Codes:        Roney Marion, PT  Acute Rehabilitation Services Pager [phone] Office [phone]   [place] 12/25/2016, 5:00 PM

## 2016-12-26 ENCOUNTER — Encounter (HOSPITAL_COMMUNITY): Payer: Self-pay | Admitting: Orthopedic Surgery

## 2016-12-26 ENCOUNTER — Ambulatory Visit
Admission: RE | Admit: 2016-12-26 | Discharge: 2016-12-26 | Disposition: A | Discharge status: Home / Self Care | Payer: Medicare HMO | Source: Ambulatory Visit | Attending: Radiation Oncology | Admitting: Radiation Oncology

## 2016-12-26 ENCOUNTER — Ambulatory Visit
Admission: RE | Admit: 2016-12-26 | Discharge: 2016-12-26 | Disposition: A | Discharge status: Home / Self Care | Payer: Medicare HMO | Source: Ambulatory Visit | Admitting: Radiation Oncology

## 2016-12-26 ENCOUNTER — Inpatient Hospital Stay (HOSPITAL_COMMUNITY): Payer: Medicare HMO

## 2016-12-26 DIAGNOSIS — F1029 Alcohol dependence with unspecified alcohol-induced disorder: Secondary | ICD-10-CM

## 2016-12-26 DIAGNOSIS — G5631 Lesion of radial nerve, right upper limb: Secondary | ICD-10-CM

## 2016-12-26 DIAGNOSIS — N6320 Unspecified lump in the left breast, unspecified quadrant: Secondary | ICD-10-CM | POA: Diagnosis present

## 2016-12-26 DIAGNOSIS — C50919 Malignant neoplasm of unspecified site of unspecified female breast: Secondary | ICD-10-CM | POA: Insufficient documentation

## 2016-12-26 DIAGNOSIS — S42411G Displaced simple supracondylar fracture without intercondylar fracture of right humerus, subsequent encounter for fracture with delayed healing: Secondary | ICD-10-CM

## 2016-12-26 DIAGNOSIS — Z510 Encounter for antineoplastic radiation therapy: Secondary | ICD-10-CM | POA: Insufficient documentation

## 2016-12-26 DIAGNOSIS — S42411A Displaced simple supracondylar fracture without intercondylar fracture of right humerus, initial encounter for closed fracture: Secondary | ICD-10-CM

## 2016-12-26 DIAGNOSIS — G9340 Encephalopathy, unspecified: Secondary | ICD-10-CM

## 2016-12-26 DIAGNOSIS — C7951 Secondary malignant neoplasm of bone: Secondary | ICD-10-CM | POA: Insufficient documentation

## 2016-12-26 HISTORY — DX: Unspecified lump in the left breast, unspecified quadrant: N63.20

## 2016-12-26 LAB — PROTIME-INR
INR: 1.09
Prothrombin Time: 14.1 seconds (ref 11.4–15.2)

## 2016-12-26 LAB — MAGNESIUM: Magnesium: 1.9 mg/dL (ref 1.7–2.4)

## 2016-12-26 MED ORDER — VITAMIN B-12 1000 MCG PO TABS
1000.0000 ug | ORAL_TABLET | Freq: Every day | ORAL | Status: DC
  Administered 2016-12-26 – 2016-12-28 (×3): 1000 ug via ORAL
  Filled 2016-12-26 (×3): qty 1

## 2016-12-26 MED ORDER — LIDOCAINE HCL (PF) 1 % IJ SOLN
INTRAMUSCULAR | Status: AC
  Filled 2016-12-26: qty 30

## 2016-12-26 MED ORDER — TECHNETIUM TC 99M MEDRONATE IV KIT
25.0000 | PACK | Freq: Once | INTRAVENOUS | Status: AC | PRN
  Administered 2016-12-26: 25 via INTRAVENOUS

## 2016-12-26 NOTE — Progress Notes (Signed)
PROGRESS NOTE        PATIENT DETAILS Name: Taylor Brown Age: 66 y.o. Sex: female Date of Birth: 19-Jan-1951 Admit Date: 12/19/2016 Admitting Physician Altamese Rothsay, MD VAN:VBTYOMA, [name]  Brief Narrative: Patient is a 66 y.o. female with long-standing history of tobacco and alcohol abuse -last seen by a primary M.D. more than 50 years back, transferred from [facility] to the orthopedic service for evaluation of right supracondylar distal humerus fracture with acute radial nerve palsy. Further evaluation revealed that the distal humerus fracture was pathological, and upon further evaluation-she was found to have a left breast mass with extensive metastatic disease to her spine,and liver.. See below for further details   Subjective: Patient denies any pain, denies any chest pain, shortness of breath  Assessment/Plan: Right supracondylar humerus pathological fracture with acute radial palsy: Patient is status post open reduction internal fixation of distal humerus fracture. PT/OT. Splint from OT. Postop care deferred to the orthopedic service. Suspected to be a pathological fracture-radiation oncology aware of this patient-and the need for possible palliative radiation.   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 MRI of the entire spine showed significant metastatic disease around the thoracic 3rd vertebra, but numerous other vertebra is also involved. Bone scan ordered and pending. MRI findings was discussed with radiation oncology on call-Dr since no evidence of cord compression, no immediate recommendations was provided-radiation oncology will formally consult 6/18  Acute toxic metabolic encephalopathy: Resolved, she is completely awake and alert. Etiology of encephalopathy felt to be multifactorial-secondary  to alcohol withdrawal and medication effect.   Alcohol withdrawal: No further withdrawal symptoms, last drink on 6/9, should be out of the window for further alcohol withdrawal symptoms. Managed with Ativan per protocol.   Antral gastritis-EtOH an NSAID-induced: Status post EGD at [facility] for antral gastritis-continue with PPI. No overt evidence of bleeding at this time.  Tobacco abuse:  Nicotine cessation counseling done  Macrocytosis: Folate levels within normal limits-vitamin B-12 level-borderline low 275 . will begin B-12 supplementation  Hypertension: Continue metoprolol-follow BP trend and adjust accordingly.  Mildly elevated TSH: Likely subclinical hypothyroidism-no indication for supplementation-stable for outpatient follow-up.   Echo (reviewed):EF 65-70% on TTE done on 6/12  DVT Prophylaxis: Prophylactic Lovenox   Code Status: Full code   Disposition Plan: SNF on discharge  Family communication: None at bedside  Antimicrobial agents: Anti-infectives    Start     Dose/Rate Route Frequency Ordered Stop   12/23/16 1700  ceFAZolin (ANCEF) IVPB 1 g/50 mL premix     1 g 100 mL/hr over 30 Minutes Intravenous Every 8 hours 12/23/16 1421 12/24/16 1041   12/23/16 1000  ceFAZolin (ANCEF) IVPB 2g/100 mL premix     2 g 200 mL/hr over 30 Minutes Intravenous  Once 12/22/16 0956 12/23/16 1003   12/20/16 0600  ceFAZolin (ANCEF) IVPB 2g/100 mL premix  Status:  Discontinued     2 g 200 mL/hr over 30 Minutes Intravenous To Short Stay 12/19/16 1611 12/21/16 0600      Time spent: 25- minutes-Greater than 50% of this time was spent in counseling, explanation of diagnosis, planning of further management, and coordination of care.  MEDICATIONS: Scheduled Meds: . cholecalciferol  2,000 Units Oral BID  . enoxaparin (LOVENOX) injection  40 mg Subcutaneous Q24H  . feeding supplement  1 Container Oral BID BM  . folic acid  1 mg Oral Daily  . mouth rinse  15 mL Mouth Rinse BID    . metoprolol tartrate  12.5 mg Oral BID  . multivitamin with minerals  1 tablet Oral Daily  . pantoprazole  40 mg Oral BID AC  . polyethylene glycol  17 g Oral Daily  . senna-docusate  2 tablet Oral QHS  . thiamine  100 mg Oral Daily   Continuous Infusions: . lactated ringers 10 mL/hr at 12/23/16 0930  . methocarbamol (ROBAXIN)  IV     PRN Meds:.acetaminophen **OR** acetaminophen, bisacodyl, cloNIDine, hydrALAZINE, ipratropium-albuterol, methocarbamol **OR** methocarbamol (ROBAXIN)  IV, morphine injection, oxyCODONE   PHYSICAL EXAM: Vital signs: Vitals:   12/25/16 0708 12/25/16 1300 12/25/16 2049 12/26/16 0819  BP: 135/70 (!) 109/57 (!) 115/56 127/61  Pulse: 100 87 88 93  Resp: _0 Temp: 98.6 F (37 C) 99.3 F (37.4 C) 98.5 F (36.9 C) 98.6 F (37 C)  TempSrc: Oral Oral Oral Oral  SpO2: 93% 99% 99% 97%   There were no vitals filed for this visit. There is no height or weight on file to calculate BMI.   General appearance :Awake, alert, not in any distress.  Eyes:, pupils equally reactive to light and accomodation,no scleral icterus. HEENT: Atraumatic and Normocephalic Neck: supple, no JVD. Resp:Good air entry bilaterally CVS: S1 S2 regular, no murmurs.  GI: Bowel sounds present, Non tender and not distended with no gaurding, rigidity or rebound. Extremities: B/L Lower Ext shows no edema Neurology:  Non focal Psychiatric: Normal judgment and insight.  Musculoskeletal:No digital cyanosis Skin:No Rash, warm and dry Wounds:N/A  I have personally reviewed following labs and imaging studies  LABORATORY DATA: CBC:  Recent Labs Lab 12/20/16 0458 12/20/16 1001 12/21/16 0312 12/22/16 0458 12/23/16 0355 12/23/16 2337  WBC 7.9  --  7.6 7.0 7.3 8.0  NEUTROABS  --   --  5.1  --   --   --   HGB 13.6  --  13.8 13.4 13.5 12.5  HCT 42.2 38.8 42.5 42.9 42.9 39.2  MCV 107.1*  --  106.8* 110.3* 110.3* 107.7*  PLT 168  --  194 211 257 503    Basic Metabolic  Panel:  Recent Labs Lab 12/20/16 1121 12/21/16 0312 12/22/16 0458 12/23/16 0355 12/23/16 1923 12/24/16 0716  NA  --  139 143 142 137 136  K  --  4.0 4.8 4.5 4.2 4.1  CL  --  104 106 103 101 99*  CO2  --  _1 GLUCOSE  --  124* 141* 110* 135* 113*  BUN  --  _2 CREATININE  --  0.64 0.54 0.56 0.58 0.58  CALCIUM  --  8.5* 9.3 9.1 8.9 8.6*  MG 2.0  --  1.9  --   --  1.8  PHOS 2.8  --   --   --   --   --     GFR: Estimated Creatinine Clearance: 68.5 mL/min (by C-G formula based on SCr of 0.58 mg/dL).  Liver Function Tests:  Recent Labs Lab 12/20/16 0458 12/21/16 0312 12/22/16 0458 12/23/16 0355  AST 40 39 34 38  ALT 36 34 31 31  ALKPHOS 116 105 98 107  BILITOT 1.4* 1.0 0.9 0.9  PROT 6.0* 6.2* 6.0* 6.4*  ALBUMIN 2.7* 2.8* 2.8* 2.8*   No  results for input(s): LIPASE, AMYLASE in the last 168 hours.  Recent Labs Lab 12/20/16 1001  AMMONIA 18    Coagulation Profile:  Recent Labs Lab 12/21/16 0312  INR 1.00    Cardiac Enzymes: No results for input(s): CKTOTAL, CKMB, CKMBINDEX, TROPONINI in the last 168 hours.  BNP (last 3 results) No results for input(s): PROBNP in the last 8760 hours.  HbA1C: No results for input(s): HGBA1C in the last 72 hours.  CBG:  Recent Labs Lab 12/20/16 1214 12/21/16 0831  GLUCAP 103* 138*    Lipid Profile: No results for input(s): CHOL, HDL, LDLCALC, TRIG, CHOLHDL, LDLDIRECT in the last 72 hours.  Thyroid Function Tests: No results for input(s): TSH, T4TOTAL, FREET4, T3FREE, THYROIDAB in the last 72 hours.  Anemia Panel: No results for input(s): VITAMINB12, FOLATE, FERRITIN, TIBC, IRON, RETICCTPCT in the last 72 hours.  Urine analysis:    Component Value Date/Time   COLORURINE YELLOW 12/25/2016 1212   APPEARANCEUR HAZY (A) 12/25/2016 1212   LABSPEC 1.029 12/25/2016 1212   PHURINE 7.0 12/25/2016 1212   GLUCOSEU NEGATIVE 12/25/2016 1212   HGBUR NEGATIVE 12/25/2016 1212   BILIRUBINUR NEGATIVE  12/25/2016 1212   KETONESUR NEGATIVE 12/25/2016 1212   PROTEINUR NEGATIVE 12/25/2016 1212   UROBILINOGEN 0.2 05/13/2012 1440   NITRITE NEGATIVE 12/25/2016 1212   LEUKOCYTESUR NEGATIVE 12/25/2016 1212    Sepsis Labs: Lactic Acid, Venous No results found for: LATICACIDVEN  MICROBIOLOGY: Recent Results (from the past 240 hour(s))  MRSA PCR Screening     Status: None   Collection Time: 12/19/16  5:29 PM  Result Value Ref Range Status   MRSA by PCR NEGATIVE NEGATIVE Final    Comment:        The GeneXpert MRSA Assay (FDA approved for NASAL specimens only), is one component of a comprehensive MRSA colonization surveillance program. It is not intended to diagnose MRSA infection nor to guide or monitor treatment for MRSA infections.   Culture, Urine     Status: Abnormal   Collection Time: 12/20/16 11:10 AM  Result Value Ref Range Status   Specimen Description URINE, RANDOM  Final   Special Requests NONE  Final   Culture <10,000 COLONIES/mL INSIGNIFICANT GROWTH (A)  Final   Report Status 12/21/2016 FINAL  Final  Surgical pcr screen     Status: None   Collection Time: 12/22/16  9:09 PM  Result Value Ref Range Status   MRSA, PCR NEGATIVE NEGATIVE Final   Staphylococcus aureus NEGATIVE NEGATIVE Final    Comment:        The Xpert SA Assay (FDA approved for NASAL specimens in patients over 67 years of age), is one component of a comprehensive surveillance program.  Test performance has been validated by [facility] for patients greater than or equal to 20 year old. It is not intended to diagnose infection nor to guide or monitor treatment.     RADIOLOGY STUDIES/RESULTS: Dg Forearm Right  Result Date: 12/20/2016 CLINICAL DATA:  Right arm fracture EXAM: RIGHT FOREARM - 2 VIEW COMPARISON:  None. FINDINGS: No fracture or dislocation is seen in the forearm. Distal humeral fracture may be visible on one of the two views, but is better demonstrated on the prior study. Overlying  cast obscures fine osseous detail. IMPRESSION: No fracture or dislocation is seen in the forearm. Known distal humerus fracture is not well visualized. Electronically Signed   By: Julian Hy M.D.   On: 12/20/2016 11:19   Ct Chest W Contrast  Result Date:  12/24/2016 CLINICAL DATA:  Inpatient admitted with pathologic right humerus fracture and left breast mass. Suspected metastatic breast cancer. Staging. EXAM: CT CHEST, ABDOMEN, AND PELVIS WITH CONTRAST TECHNIQUE: Multidetector CT imaging of the chest, abdomen and pelvis was performed following the standard protocol during bolus administration of intravenous contrast. CONTRAST:  119m ISOVUE-300 IOPAMIDOL (ISOVUE-300) INJECTION 61% COMPARISON:  12/17/2016 chest CT. FINDINGS: CT CHEST FINDINGS Cardiovascular: Top-normal heart size. No significant pericardial fluid/thickening. Atherosclerotic nonaneurysmal thoracic aorta. Normal caliber pulmonary arteries. No central pulmonary emboli. Mediastinum/Nodes: No discrete thyroid nodules. Unremarkable esophagus. No pathologically enlarged axillary, mediastinal or hilar lymph nodes. Lungs/Pleura: No pneumothorax. No pleural effusion. Right middle lobe 5 mm (series 4/image 81) and 4 mm lingular (series 4/image 68) solid pulmonary nodules, both stable since 12/17/2016. Subsegmental atelectasis in the dependent lungs bilaterally. Otherwise no acute consolidative airspace disease, lung masses or additional significant pulmonary nodules. Musculoskeletal: Expansile lytic destructive bone lesion at the right third costovertebral junction (series 4/image 20) with mass-effect on the right thoracic spinal canal. Healed deformities of the lateral left seventh and eighth ribs. Partially visualized surgical fixation of the right distal humerus. Marked thoracic spondylosis . Irregular solid 5.3 x 2.8 cm outer left breast mass, extending to the skin (series 3/image 30). CT ABDOMEN PELVIS FINDINGS Hepatobiliary: Diffuse hepatic  steatosis. The known liver masses (at least 4) in the liver seen on the 12/17/2016 chest CT study are poorly visualized on the routine phase sequence through the liver on today's scan, probably due to differences in contrast timing. Representative liver masses include a 1.7 x 1.5 cm liver dome lesion (series 3/ image 43), a 1.5 x 1.1 cm segment 4B left liver lobe lesion (series 8/ image 7) and a 0.9 x 0.7 cm segment 3 left liver lobe lesion (series 8/image 8). Normal gallbladder with no radiopaque cholelithiasis. No biliary ductal dilatation. Pancreas: Normal, with no mass or duct dilation. Spleen: Normal size. No mass. Adrenals/Urinary Tract: Suggestion of an indeterminate 1.2 cm lower left adrenal nodule (series 3/ image 54) with density 875 HU. No additional discrete adrenal nodules. No hydronephrosis. Mild segmental renal cortical scarring in the upper right kidney. No renal mass. Bladder collapsed by indwelling Foley catheter, limiting bladder evaluation. Grossly normal bladder. Stomach/Bowel: Grossly normal stomach. Normal caliber small bowel with no small bowel wall thickening. Appendectomy. Normal large bowel with no diverticulosis, large bowel wall thickening or pericolonic fat stranding. Vascular/Lymphatic: Atherosclerotic nonaneurysmal abdominal aorta. Patent portal, splenic, hepatic and renal veins. No pathologically enlarged lymph nodes in the abdomen or pelvis. Reproductive: Suggestion of abnormal endometrial thickening (approximately 10 mm). No adnexal mass. Other: No pneumoperitoneum, ascites or focal fluid collection. Small fat containing umbilical hernia. Musculoskeletal: No aggressive appearing focal osseous lesions. Mild L3 and L4 superior vertebral compression fractures of indeterminate chronicity. Moderate lumbar spondylosis . IMPRESSION: 1. Irregular solid 5.3 cm upper left breast mass extending to the skin, suspicious for primary left breast malignancy. Recommend correlation with diagnostic  mammographic evaluation. 2. Expansile lytic destructive bone lesion at the right third costovertebral junction with mass-effect on the right thoracic spinal canal, compatible with osseous metastasis. Consider correlation with thoracic spine MRI without and with IV contrast to evaluate for possible thoracic cord compression, as clinically warranted . 3. Two subcentimeter pulmonary nodules, indeterminate for metastases. Recommend attention on follow-up chest CT in 3 months. 4. Four scattered small liver masses, suspicious for liver metastases. These could be further evaluated with MRI abdomen without and with IV contrast versus PET-CT, as clinically warranted . 5.  Possible small left adrenal metastasis. 6. Suggestion of abnormal endometrial thickening (10 mm). Recommend correlation would transabdominal and transvaginal pelvic ultrasound. 7. Mild L3 and L4 vertebral compression fractures of indeterminate chronicity. 8. Additional chronic findings as above. Electronically Signed   By: Ilona Sorrel M.D.   On: 12/24/2016 18:07   Ct Angio Chest Pe W Or Wo Contrast  Result Date: 12/17/2016 CLINICAL DATA:  Shortness of Breath EXAM: CT ANGIOGRAPHY CHEST WITH CONTRAST TECHNIQUE: Multidetector CT imaging of the chest was performed using the standard protocol during bolus administration of intravenous contrast. Multiplanar CT image reconstructions and MIPs were obtained to evaluate the vascular anatomy. CONTRAST:  75 mL Isovue 370 nonionic COMPARISON:  Chest radiograph December 17, 2016 FINDINGS: Cardiovascular: There is no demonstrable pulmonary embolus. There is no thoracic aortic aneurysm or dissection. Main pulmonary outflow tract measures 3.1 cm in length. There is mild calcification at the origin of the left subclavian artery. Other visualized great vessels appear unremarkable. Pericardium is not appreciably thickened. There is left ventricular hypertrophy. Mediastinum/Nodes: Visualized thyroid appears unremarkable. There is  no appreciable thoracic adenopathy. Lungs/Pleura: There is mild bibasilar atelectasis. There is no parenchymal lung edema or consolidation. No pleural effusion or pleural thickening evident. Upper Abdomen: There is diffuse hepatic steatosis. There is an enhancing focus in the anterior segment of the right lobe of the liver measuring 1.7 x 1.7 cm. A similar appearing lesion is noted near the dome of the liver in the medial segment left lobe measuring 1.5 x 1.3 cm. No other focal liver lesions are apparent ; note that liver is incompletely visualized. Visualized upper abdominal structures otherwise appear unremarkable. Musculoskeletal: There is degenerative change in the thoracic spine. There are no blastic or lytic bone lesions. There are several healed rib fractures on the left. Review of the MIP images confirms the above findings. IMPRESSION: No demonstrable pulmonary embolus. Prominence of the main pulmonary outflow tract suggests a degree of pulmonary arterial hypertension. No adenopathy. No lung edema or consolidation.  Bibasilar atelectasis noted. Enhancing lesions in the liver near the dome noted. Etiology uncertain. This finding warrants nonemergent pre and serial post-contrast CT or MR of the liver to further evaluate. Old healed rib fractures on the left. Left ventricular hypertrophy. Electronically Signed   By: Lowella Grip III M.D.   On: 12/17/2016 13:51   Mr Cervical Spine W Wo Contrast  Result Date: 12/25/2016 CLINICAL DATA:  Metastatic disease.  Breast mass. EXAM: MRI TOTAL SPINE WITHOUT AND WITH CONTRAST TECHNIQUE: Multisequence MR imaging of the spine from the cervical spine to the sacrum was performed prior to and following IV contrast administration for evaluation of spinal metastatic disease. CONTRAST:  33m MULTIHANCE GADOBENATE DIMEGLUMINE 529 MG/ML IV SOLN COMPARISON:  CT chest abdomen pelvis 12/24/2016 FINDINGS: MRI CERVICAL SPINE FINDINGS Image quality degraded by motion. Alignment:  Normal Vertebrae: No fracture identified. Enhancing lesion C7 vertebral body compatible with metastatic disease. No epidural extension. T3 metastatic disease also noted. Cord: Negative for cord compression.  Cord signal normal. Posterior Fossa, vertebral arteries, paraspinal tissues: Negative Disc levels: Mild disc degeneration at C4-5 and C5-6 and C6-7. Mild facet degeneration on the left at C4-5. Left foraminal narrowing C5-6 due to disc protrusion and spurring. MRI THORACIC SPINE FINDINGS Alignment:  Normal Vertebrae: Metastatic disease throughout the T3 vertebral body extending into the pedicle and posterior elements. There is also tumor in the right foramen at T2-3 and T3-4 which could cause radicular symptoms. Slight epidural tumor without cord compression. Tumor also  present in the spinous processes of T1, T2, T3, T8. Small tumor deposit T5 vertebral body and right superior articulating facet. Tumor in the right sixth rib. Small enhancing lesions T12 vertebral body compatible with metastatic disease. Accentuated dorsal kyphosis.  No pathologic fracture Cord:  Negative for cord compression.  Spinal cord signal normal. Paraspinal and other soft tissues: Negative Disc levels: Multilevel thoracic disc degeneration without significant spinal stenosis. MRI LUMBAR SPINE FINDINGS Segmentation:  Normal Alignment:  Normal Vertebrae: Enhancing metastatic disease is seen in the lumbar spine. Enhancing lesion on the right at L2. Diffuse vertebral body enhancement of L3 with pathologic fracture. Enhancing tumor extends in the pedicle and posterior elements on the right without foraminal encroachment. Small enhancing lesion S2 on the left. Mild fracture of L4 appears chronic and benign. Conus medullaris: Extends to the L1-2 level and appears normal. Paraspinal and other soft tissues: Diffuse paraspinous muscle atrophy. No retroperitoneal adenopathy Disc levels: L1-2:  Negative L2-3:  Disc and facet degeneration with  moderate spinal stenosis L3-4:  Disc and facet degeneration with severe spinal stenosis L4-5:  Disc and facet degeneration with moderate spinal stenosis L5-S1:  Severe facet degeneration without stenosis. IMPRESSION: Metastatic disease C7 vertebral body without epidural tumor or fracture Multiple metastatic deposits in the thoracic and lumbar spine. Prominent tumor is present on the right at T3 extending into the soft tissues as well as the foramina on the right at T2-3 and T3-4. See above detailed Advanced lumbar degenerative changes with spinal stenosis at L2-3, L3-4, L4-5 Electronically Signed   By: Franchot Gallo M.D.   On: 12/25/2016 11:51   Mr Thoracic Spine W Wo Contrast  Result Date: 12/25/2016 CLINICAL DATA:  Metastatic disease.  Breast mass. EXAM: MRI TOTAL SPINE WITHOUT AND WITH CONTRAST TECHNIQUE: Multisequence MR imaging of the spine from the cervical spine to the sacrum was performed prior to and following IV contrast administration for evaluation of spinal metastatic disease. CONTRAST:  65m MULTIHANCE GADOBENATE DIMEGLUMINE 529 MG/ML IV SOLN COMPARISON:  CT chest abdomen pelvis 12/24/2016 FINDINGS: MRI CERVICAL SPINE FINDINGS Image quality degraded by motion. Alignment: Normal Vertebrae: No fracture identified. Enhancing lesion C7 vertebral body compatible with metastatic disease. No epidural extension. T3 metastatic disease also noted. Cord: Negative for cord compression.  Cord signal normal. Posterior Fossa, vertebral arteries, paraspinal tissues: Negative Disc levels: Mild disc degeneration at C4-5 and C5-6 and C6-7. Mild facet degeneration on the left at C4-5. Left foraminal narrowing C5-6 due to disc protrusion and spurring. MRI THORACIC SPINE FINDINGS Alignment:  Normal Vertebrae: Metastatic disease throughout the T3 vertebral body extending into the pedicle and posterior elements. There is also tumor in the right foramen at T2-3 and T3-4 which could cause radicular symptoms. Slight  epidural tumor without cord compression. Tumor also present in the spinous processes of T1, T2, T3, T8. Small tumor deposit T5 vertebral body and right superior articulating facet. Tumor in the right sixth rib. Small enhancing lesions T12 vertebral body compatible with metastatic disease. Accentuated dorsal kyphosis.  No pathologic fracture Cord:  Negative for cord compression.  Spinal cord signal normal. Paraspinal and other soft tissues: Negative Disc levels: Multilevel thoracic disc degeneration without significant spinal stenosis. MRI LUMBAR SPINE FINDINGS Segmentation:  Normal Alignment:  Normal Vertebrae: Enhancing metastatic disease is seen in the lumbar spine. Enhancing lesion on the right at L2. Diffuse vertebral body enhancement of L3 with pathologic fracture. Enhancing tumor extends in the pedicle and posterior elements on the right without foraminal encroachment. Small enhancing  lesion S2 on the left. Mild fracture of L4 appears chronic and benign. Conus medullaris: Extends to the L1-2 level and appears normal. Paraspinal and other soft tissues: Diffuse paraspinous muscle atrophy. No retroperitoneal adenopathy Disc levels: L1-2:  Negative L2-3:  Disc and facet degeneration with moderate spinal stenosis L3-4:  Disc and facet degeneration with severe spinal stenosis L4-5:  Disc and facet degeneration with moderate spinal stenosis L5-S1:  Severe facet degeneration without stenosis. IMPRESSION: Metastatic disease C7 vertebral body without epidural tumor or fracture Multiple metastatic deposits in the thoracic and lumbar spine. Prominent tumor is present on the right at T3 extending into the soft tissues as well as the foramina on the right at T2-3 and T3-4. See above detailed Advanced lumbar degenerative changes with spinal stenosis at L2-3, L3-4, L4-5 Electronically Signed   By: Franchot Gallo M.D.   On: 12/25/2016 11:51   Mr Lumbar Spine W Wo Contrast  Result Date: 12/25/2016 CLINICAL DATA:  Metastatic  disease.  Breast mass. EXAM: MRI TOTAL SPINE WITHOUT AND WITH CONTRAST TECHNIQUE: Multisequence MR imaging of the spine from the cervical spine to the sacrum was performed prior to and following IV contrast administration for evaluation of spinal metastatic disease. CONTRAST:  64m MULTIHANCE GADOBENATE DIMEGLUMINE 529 MG/ML IV SOLN COMPARISON:  CT chest abdomen pelvis 12/24/2016 FINDINGS: MRI CERVICAL SPINE FINDINGS Image quality degraded by motion. Alignment: Normal Vertebrae: No fracture identified. Enhancing lesion C7 vertebral body compatible with metastatic disease. No epidural extension. T3 metastatic disease also noted. Cord: Negative for cord compression.  Cord signal normal. Posterior Fossa, vertebral arteries, paraspinal tissues: Negative Disc levels: Mild disc degeneration at C4-5 and C5-6 and C6-7. Mild facet degeneration on the left at C4-5. Left foraminal narrowing C5-6 due to disc protrusion and spurring. MRI THORACIC SPINE FINDINGS Alignment:  Normal Vertebrae: Metastatic disease throughout the T3 vertebral body extending into the pedicle and posterior elements. There is also tumor in the right foramen at T2-3 and T3-4 which could cause radicular symptoms. Slight epidural tumor without cord compression. Tumor also present in the spinous processes of T1, T2, T3, T8. Small tumor deposit T5 vertebral body and right superior articulating facet. Tumor in the right sixth rib. Small enhancing lesions T12 vertebral body compatible with metastatic disease. Accentuated dorsal kyphosis.  No pathologic fracture Cord:  Negative for cord compression.  Spinal cord signal normal. Paraspinal and other soft tissues: Negative Disc levels: Multilevel thoracic disc degeneration without significant spinal stenosis. MRI LUMBAR SPINE FINDINGS Segmentation:  Normal Alignment:  Normal Vertebrae: Enhancing metastatic disease is seen in the lumbar spine. Enhancing lesion on the right at L2. Diffuse vertebral body enhancement  of L3 with pathologic fracture. Enhancing tumor extends in the pedicle and posterior elements on the right without foraminal encroachment. Small enhancing lesion S2 on the left. Mild fracture of L4 appears chronic and benign. Conus medullaris: Extends to the L1-2 level and appears normal. Paraspinal and other soft tissues: Diffuse paraspinous muscle atrophy. No retroperitoneal adenopathy Disc levels: L1-2:  Negative L2-3:  Disc and facet degeneration with moderate spinal stenosis L3-4:  Disc and facet degeneration with severe spinal stenosis L4-5:  Disc and facet degeneration with moderate spinal stenosis L5-S1:  Severe facet degeneration without stenosis. IMPRESSION: Metastatic disease C7 vertebral body without epidural tumor or fracture Multiple metastatic deposits in the thoracic and lumbar spine. Prominent tumor is present on the right at T3 extending into the soft tissues as well as the foramina on the right at T2-3 and T3-4. See  above detailed Advanced lumbar degenerative changes with spinal stenosis at L2-3, L3-4, L4-5 Electronically Signed   By: Franchot Gallo M.D.   On: 12/25/2016 11:51   Ct Abdomen Pelvis W Contrast  Result Date: 12/24/2016 CLINICAL DATA:  Inpatient admitted with pathologic right humerus fracture and left breast mass. Suspected metastatic breast cancer. Staging. EXAM: CT CHEST, ABDOMEN, AND PELVIS WITH CONTRAST TECHNIQUE: Multidetector CT imaging of the chest, abdomen and pelvis was performed following the standard protocol during bolus administration of intravenous contrast. CONTRAST:  111m ISOVUE-300 IOPAMIDOL (ISOVUE-300) INJECTION 61% COMPARISON:  12/17/2016 chest CT. FINDINGS: CT CHEST FINDINGS Cardiovascular: Top-normal heart size. No significant pericardial fluid/thickening. Atherosclerotic nonaneurysmal thoracic aorta. Normal caliber pulmonary arteries. No central pulmonary emboli. Mediastinum/Nodes: No discrete thyroid nodules. Unremarkable esophagus. No pathologically  enlarged axillary, mediastinal or hilar lymph nodes. Lungs/Pleura: No pneumothorax. No pleural effusion. Right middle lobe 5 mm (series 4/image 81) and 4 mm lingular (series 4/image 68) solid pulmonary nodules, both stable since 12/17/2016. Subsegmental atelectasis in the dependent lungs bilaterally. Otherwise no acute consolidative airspace disease, lung masses or additional significant pulmonary nodules. Musculoskeletal: Expansile lytic destructive bone lesion at the right third costovertebral junction (series 4/image 20) with mass-effect on the right thoracic spinal canal. Healed deformities of the lateral left seventh and eighth ribs. Partially visualized surgical fixation of the right distal humerus. Marked thoracic spondylosis . Irregular solid 5.3 x 2.8 cm outer left breast mass, extending to the skin (series 3/image 30). CT ABDOMEN PELVIS FINDINGS Hepatobiliary: Diffuse hepatic steatosis. The known liver masses (at least 4) in the liver seen on the 12/17/2016 chest CT study are poorly visualized on the routine phase sequence through the liver on today's scan, probably due to differences in contrast timing. Representative liver masses include a 1.7 x 1.5 cm liver dome lesion (series 3/ image 43), a 1.5 x 1.1 cm segment 4B left liver lobe lesion (series 8/ image 7) and a 0.9 x 0.7 cm segment 3 left liver lobe lesion (series 8/image 8). Normal gallbladder with no radiopaque cholelithiasis. No biliary ductal dilatation. Pancreas: Normal, with no mass or duct dilation. Spleen: Normal size. No mass. Adrenals/Urinary Tract: Suggestion of an indeterminate 1.2 cm lower left adrenal nodule (series 3/ image 54) with density 875 HU. No additional discrete adrenal nodules. No hydronephrosis. Mild segmental renal cortical scarring in the upper right kidney. No renal mass. Bladder collapsed by indwelling Foley catheter, limiting bladder evaluation. Grossly normal bladder. Stomach/Bowel: Grossly normal stomach. Normal  caliber small bowel with no small bowel wall thickening. Appendectomy. Normal large bowel with no diverticulosis, large bowel wall thickening or pericolonic fat stranding. Vascular/Lymphatic: Atherosclerotic nonaneurysmal abdominal aorta. Patent portal, splenic, hepatic and renal veins. No pathologically enlarged lymph nodes in the abdomen or pelvis. Reproductive: Suggestion of abnormal endometrial thickening (approximately 10 mm). No adnexal mass. Other: No pneumoperitoneum, ascites or focal fluid collection. Small fat containing umbilical hernia. Musculoskeletal: No aggressive appearing focal osseous lesions. Mild L3 and L4 superior vertebral compression fractures of indeterminate chronicity. Moderate lumbar spondylosis . IMPRESSION: 1. Irregular solid 5.3 cm upper left breast mass extending to the skin, suspicious for primary left breast malignancy. Recommend correlation with diagnostic mammographic evaluation. 2. Expansile lytic destructive bone lesion at the right third costovertebral junction with mass-effect on the right thoracic spinal canal, compatible with osseous metastasis. Consider correlation with thoracic spine MRI without and with IV contrast to evaluate for possible thoracic cord compression, as clinically warranted . 3. Two subcentimeter pulmonary nodules, indeterminate for metastases. Recommend attention  on follow-up chest CT in 3 months. 4. Four scattered small liver masses, suspicious for liver metastases. These could be further evaluated with MRI abdomen without and with IV contrast versus PET-CT, as clinically warranted . 5. Possible small left adrenal metastasis. 6. Suggestion of abnormal endometrial thickening (10 mm). Recommend correlation would transabdominal and transvaginal pelvic ultrasound. 7. Mild L3 and L4 vertebral compression fractures of indeterminate chronicity. 8. Additional chronic findings as above. Electronically Signed   By: Ilona Sorrel M.D.   On: 12/24/2016 18:07   Dg  Chest Port 1 View  Result Date: 12/23/2016 CLINICAL DATA:  Shortness of breath EXAM: PORTABLE CHEST 1 VIEW COMPARISON:  12/17/2016 FINDINGS: Linear atelectasis at the medial left base. Cannot exclude small left pleural effusion. Mild cardiomegaly with atherosclerosis. No overt pulmonary edema. No pneumothorax. Old left rib fracture. IMPRESSION: 1. Cardiomegaly without edema or infiltrate 2. Subsegmental atelectasis at the left lung base. Electronically Signed   By: Donavan Foil M.D.   On: 12/23/2016 19:08   Dg Chest Portable 1 View  Result Date: 12/17/2016 CLINICAL DATA:  Shortness of breath and tachycardia EXAM: PORTABLE CHEST 1 VIEW COMPARISON:  05/13/2012 FINDINGS: Cardiomegaly noted. There is no evidence of focal airspace disease, pulmonary edema, suspicious pulmonary nodule/mass, pleural effusion, or pneumothorax. No acute bony abnormalities are identified. IMPRESSION: Cardiomegaly without evidence of acute cardiopulmonary disease. Electronically Signed   By: Margarette Canada M.D.   On: 12/17/2016 12:11   Dg Bone Survey Met  Result Date: 12/23/2016 CLINICAL DATA:  (Breast lesion. Pathologic fracture of the right humerus. Status post ORIF of the right humerus. Evaluate for metastasis. EXAM: METASTATIC BONE SURVEY COMPARISON:  CXR and right humerus radiographs from 12/17/2016, left ankle radiographs from 05/11/2012, left wrist radiographs small 05/11/2012 FINDINGS: Lateral skull: Occipital lytic skull lucency measuring 13 x 8 mm. No fracture. Both shoulders: Negative for lytic or blastic disease. No acute fracture nor dislocation. Left old sixth and seventh rib fractures are incidentally included. Both humeri: Pathologic fracture through moth eaten lytic lesion of the distal right humeral diaphysis transfixed by plate and screws. Lesion at the junction of the middle and distal third of the humeral shaft. Left humerus is negative. Radius and ulna: Negative for lytic or blastic disease. Volar plate and screw  fixation across distal left radius fracture. Cervical spine AP and lateral: Degenerative disc and facet arthropathy. No suspicious osseous abnormality. Thoracic spine: Multilevel degenerative disc and endplate changes consistent with spondylosis. Lumbar spine: Chronic appearing superior endplate compressions of L1, L3 and L4 without retropulsion. No lytic or blastic disease. Lower lumbar facet arthropathy from L3 through S1. Slight disc space narrowing at L5-S1. PA chest: Normal size heart. Aortic atherosclerosis. Atelectasis at the lung bases. No dominant mass. Elevation of the right hemidiaphragm versus eventration. Old left-sided rib fractures. AP pelvis: Lower lumbar facet arthropathy. Intact SI joints and pubic symphysis with osteoarthritis of the pubic symphysis. Old right inferior pubic ramus fracture. No suspicious lytic or blastic disease of the pelvis and included hips. Both femora, tibia and fibula: Negative for lytic or blastic disease. ORIF of left bimalleolar fractures. IMPRESSION: 13 x 8 mm lytic focus involving the occiput of the skull which could be a normal variant such is an arachnoid granulation. CT may help for further correlation. No additional lytic or blastic disease are apparent. Pathologic fracture transfixed by plate and screws involving the distal right humeral shaft. ORIF of the distal left radius and left bimalleolar fractures are also noted. Cervical, thoracic and lumbar  spondylosis. Chronic benign-appearing endplate compressions of L1, L3 and L4. Electronically Signed   By: Ashley Royalty M.D.   On: 12/23/2016 17:11   Dg Humerus Right  Result Date: 12/23/2016 CLINICAL DATA:  OPEN REDUCTION INTERNAL FIXATION (ORIF) DISTAL HUMERUS FRACTURE (Right Arm Upper). Radiation Safety Timeout performed by BIW. Fluoro time 12 seconds. EXAM: DG C-ARM 61-120 MIN; RIGHT HUMERUS - 2+ VIEW COMPARISON:  12/20/2016 FINDINGS: Patient has undergone screw plate fixation of right humerus fracture. A  cortical screw traverses the fracture site. There is near anatomic alignment. Elbow is unremarkable in appearance. IMPRESSION: Status post ORIF of the right humerus. Lucency at the fracture site, raising the question of pathologic fracture. Correlation with history is recommended. Electronically Signed   By: Nolon Nations M.D.   On: 12/23/2016 13:53   Dg Humerus Right  Result Date: 12/17/2016 CLINICAL DATA:  Acute onset pain while lifting heavy object EXAM: RIGHT HUMERUS - 2+ VIEW COMPARISON:  None. FINDINGS: Frontal and lateral views were obtained. There is an obliquely oriented fracture of the distal humerus with medial angulation and anterior displacement of the distal fracture fragment with respect proximal fragment. No other fractures. No dislocations. No abnormal periosteal reaction or appreciable arthropathy. IMPRESSION: Obliquely oriented fracture distal humerus with displacement angulation of fracture fragments. No dislocation. No abnormal periosteal reaction. No appreciable arthropathic change. Electronically Signed   By: Lowella Grip III M.D.   On: 12/17/2016 11:43   Dg Hand Complete Left  Result Date: 12/20/2016 CLINICAL DATA:  Left hand swelling EXAM: LEFT HAND - COMPLETE 3+ VIEW COMPARISON:  None. FINDINGS: No evidence of acute fracture or dislocation. Status post ORIF of the distal radius. Moderate degenerative changes at the 1st carpometacarpal joint. The visualized soft tissues are unremarkable. IMPRESSION: No acute osseus abnormality is seen. Status post ORIF of the distal radius. Moderate degenerative changes of the 1st carpometacarpal joint. Electronically Signed   By: Julian Hy M.D.   On: 12/20/2016 11:10   Dg C-arm 1-60 Min  Result Date: 12/23/2016 CLINICAL DATA:  OPEN REDUCTION INTERNAL FIXATION (ORIF) DISTAL HUMERUS FRACTURE (Right Arm Upper). Radiation Safety Timeout performed by BIW. Fluoro time 12 seconds. EXAM: DG C-ARM 61-120 MIN; RIGHT HUMERUS - 2+ VIEW  COMPARISON:  12/20/2016 FINDINGS: Patient has undergone screw plate fixation of right humerus fracture. A cortical screw traverses the fracture site. There is near anatomic alignment. Elbow is unremarkable in appearance. IMPRESSION: Status post ORIF of the right humerus. Lucency at the fracture site, raising the question of pathologic fracture. Correlation with history is recommended. Electronically Signed   By: Nolon Nations M.D.   On: 12/23/2016 13:53     LOS: 7 days   Reyne Dumas, MD  [facility] Pager: [phone]  If 7PM-7AM, please contact night-coverage [email or web address] Password [email or web address] 12/26/2016, 9:09 AM

## 2016-12-26 NOTE — Progress Notes (Signed)
Physical Therapy Treatment Patient Details Name: Taylor Brown MRN: [id number] DOB: 1950-11-23 Today's Date: 12/26/2016    History of Present Illness Pt is a 66 y.o. female presenting with R distal humerus fracture with acute radial nerve injury s/p fall. During hospital admission pt was found to have antral gastritis, liver lesions, and L breast mass with possible metastatic breast cancer. Pt is now s/p ORIF R distal humerus fracture and exploration of radial nerve on 6/15. PMHx: Arthritis, GERD, ORIF L ankle fx, ORIF L wrist fx.    PT Comments    Pt arriving back on a stretcher from transport.  PTA assisted patient after transport tech unable to mobilize patient.  Pt presents with increased pain and fatigue and unable to ambulate back to her room from doorway.  PTA brought recliner closer and performed stand pivot.  Pt then positioned in chair with foldeed pillow under her knees to reduce stress on her spine.  Will f/u tomorrow to progress mobility further.  Plan for SNF remains appropriate.    Follow Up Recommendations  SNF     Equipment Recommendations  Other (comment) (TBD)    Recommendations for Other Services       Precautions / Restrictions Precautions Precautions: Fall Precaution Comments: No active abduction R shoulder. Ok for passive abduction R shoulder. [place] for active and passive R shoulder flexion. Unrestricted ROM R elbow, forearm, wrist and hand. 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  Required Braces or Orthoses: Other Brace/Splint Other Brace/Splint: R pre-fab wrist cock up splint Restrictions Weight Bearing Restrictions: Yes RUE Weight Bearing: Non weight bearing    Mobility  Bed Mobility Overal bed mobility: Needs Assistance Bed Mobility: Supine to Sit     Supine to sit: Mod assist     General bed mobility comments: Pt from flat stretcher required assist to sit up after returning from testing.  pt presents with fatigue and  decreased strength.  Upon sitting edge of bed, patient with c/o pain in low back.    Transfers Overall transfer level: Needs assistance Equipment used: 2 person hand held assist Transfers: Sit to/from Stand Sit to Stand: Mod assist         General transfer comment: Pt required assist for hand placement to and from seated surface and cues to keep RUE NWB.  Pt attempted to stand and ambulate but quickly returned to a seated position and reports she feels to weak to walk.  PTA then performed standing pivot from stretcher to chair.  Pt with DOE and 2L O2 applied after transfer.    Ambulation/Gait Ambulation/Gait assistance:  (Pt to fatigued for gait training.  )               Stairs            Wheelchair Mobility    Modified Rankin (Stroke Patients Only)       Balance Overall balance assessment: Needs assistance Sitting-balance support: Feet supported;Single extremity supported Sitting balance-Leahy Scale: Fair     Standing balance support: Bilateral upper extremity supported Standing balance-Leahy Scale: Poor                              Cognition Arousal/Alertness: Awake/alert Behavior During Therapy: WFL for tasks assessed/performed Overall Cognitive Status: Within Functional Limits for tasks assessed  Exercises      General Comments        Pertinent Vitals/Pain Pain Assessment: 0-10 Faces Pain Scale: Hurts even more Pain Location: Back pain Pain Descriptors / Indicators: Aching;Grimacing;Guarding Pain Intervention(s): Monitored during session;Repositioned    Home Living                      Prior Function            PT Goals (current goals can now be found in the care plan section) Acute Rehab PT Goals Patient Stated Goal: to get better Potential to Achieve Goals: Good Progress towards PT goals: Progressing toward goals    Frequency    Min 3X/week      PT  Plan Current plan remains appropriate    Co-evaluation              AM-PAC PT "6 Clicks" Daily Activity  Outcome Measure  Difficulty turning over in bed (including adjusting bedclothes, sheets and blankets)?: Total Difficulty moving from lying on back to sitting on the side of the bed? : Total Difficulty sitting down on and standing up from a chair with arms (e.g., wheelchair, bedside commode, etc,.)?: Total Help needed moving to and from a bed to chair (including a wheelchair)?: A Lot Help needed walking in hospital room?: Total Help needed climbing 3-5 steps with a railing? : Total 6 Click Score: 7    End of Session Equipment Utilized During Treatment: Gait belt Activity Tolerance: Patient tolerated treatment well Patient left: in chair;with call bell/phone within reach Nurse Communication: Mobility status PT Visit Diagnosis: Unsteadiness on feet (R26.81);Muscle weakness (generalized) (M62.81);Other abnormalities of gait and mobility (R26.89)     Time: 7106-2694 PT Time Calculation (min) (ACUTE ONLY): 17 min  Charges:  $Therapeutic Activity: 8-22 mins                    G Codes:       Governor [place], PTA pager [phone]    Cristela Blue 12/26/2016, 5:00 PM

## 2016-12-26 NOTE — Progress Notes (Signed)
OT Cancellation Note  Patient Details Name: Taylor Brown MRN: [id number] DOB: 11-17-1950   Cancelled Treatment:    Reason Eval/Treat Not Completed: Patient at procedure or test/ unavailable. Attempted to see pt for fabricaiton of radial nerve palsy splint. Pt having bone scan then per nursing breast biopsy. Will follow up as pt is available.   [place], OT/L  [phone] 12/26/2016 12/26/2016, 2:20 PM

## 2016-12-26 NOTE — Consult Note (Signed)
Radiation Oncology         [phone] ________________________________  Initial inpatient Consultation  Name: Taylor Brown MRN: [id number]  Date: 12/26/16  DOB: [birth year]  MW:[id number], [name]  No ref. provider found   REFERRING PHYSICIAN: No ref. 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    ICD-10-CM   1. Ecchymosis R58 DG Hand Complete Left    DG Hand Complete Left    CANCELED: DG Hand Complete Left    CANCELED: DG Hand Complete Left  2. Right supracondylar humerus fracture S42.411A DG Forearm Right    DG Forearm Right  3. Surgery, elective Z41.9 DG Humerus Right    DG Humerus Right  4. Liver lesion K76.9   5. Breast lesion N64.9   6. Pathologic fracture of right humerus M84.421A   7. Metastatic breast cancer ([place]) C50.919 [place] Core Biopsy    [place] Core Biopsy  8. Shortness of breath R06.02 DG CHEST PORT 1 VIEW    DG CHEST PORT 1 VIEW  9. 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 female seen at the request of Dr. Marcelino Scot for a putative metastatic breast cancer. The patient had noted a left breast mass for about 6 months but had not sought care for this. She lifted a heavy bag of dog food at home and felt an acute pain in her right mid arm. She presented to [facility] ED with a right distal humeral fracture and was taken in transfer to have the benefit of a trauma orthopedist. She underwent imaging as below including plain films, CT and MRI imaging. Her workup has revealed a left breast mass, presumed disease in the right third costoverebral junction with mass effect on the right thoracic spinal canal. In the left breast was a 5.3 x 2.8 cm mass in the outer breast extending to the skin. At least 4 liver lesions are presented with the largest measuring 1.7 x 1.5 cm. Her MRI of the thoracic spine revealed T3 disease and tumor in the right foramen at T2-3, T3-4, and slight epidural tumor without cord compression, also noted at the spinous processes of T1, T2, T3, and T8. A small tumor deposit at  T5 vertebral body, and at the right sixth rib, and T12 vertebral body. MRI of the cervical spine revealed arthritic appearing changes, and the lumbar spine revealed an enhancing lesion at  L3 with pathologic fracture, extending in the pedicle and posterior elements on the right without foramenal encroachment. Small lesion at S2 was also noted. We are asked to see the patient to consider postop radiotherapy to the right humerus and to consider radiotherapy as well in T3 and L3 region.    PREVIOUS RADIATION THERAPY: No  PAST MEDICAL HISTORY:  Past Medical History:  Diagnosis Date  . 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 Arthritis    osteo  . Cancer ([place])   . GERD (gastroesophageal reflux disease)   . Humerus fracture 12/19/2016   right  . Left breast mass 12/26/2016  . Liver lesion 12/20/2016  . Nicotine  dependence 12/20/2016      PAST SURGICAL HISTORY: Past Surgical History:  Procedure Laterality Date  . ANKLE FRACTURE SURGERY  05/2012  . APPENDECTOMY    . ESOPHAGOGASTRODUODENOSCOPY N/A 12/18/2016   Procedure: ESOPHAGOGASTRODUODENOSCOPY (EGD);  Surgeon: Wilford Corner, MD;  Location: [facility] ENDOSCOPY;  Service: Endoscopy;  Laterality: N/A;  . ORIF ANKLE FRACTURE  05/12/2012   Procedure: OPEN REDUCTION INTERNAL FIXATION (ORIF) ANKLE FRACTURE;  Surgeon: Wylene Simmer, MD;  Location: [place];  Service: Orthopedics;  Laterality: Left;  . ORIF WRIST FRACTURE  05/12/2012   Procedure: OPEN REDUCTION INTERNAL FIXATION (ORIF) WRIST FRACTURE;  Surgeon: Mcarthur Rossetti, MD;  Location: [place];  Service: Orthopedics;  Laterality: Left;  . TUBAL LIGATION    . WRIST FRACTURE SURGERY  05/2012    FAMILY HISTORY:  Family History  Problem Relation Age of Onset  . Hypertension Mother     SOCIAL HISTORY:  Social History   Social History  . Marital status: Married    Spouse name: N/A  . Number of children: N/A  . Years of education: N/A   Occupational History  . Not on file.   Social History Main Topics  . Smoking status: Current Every Day Smoker    Packs/day: 2.00    Years: 30.00    Types: Cigarettes  . Smokeless tobacco: Never Used     Comment: smoking cessation material refused   . Alcohol use Yes     Comment: 6 beers per day  . Drug use: No  . Sexual activity: Not Currently   Other Topics Concern  . Not on file   Social History Narrative  . No narrative on file  The patient is widowed and lives in [place]. She has an adult daughter who lives about 1 1/2 away. 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 She retired from working at [employer].  ALLERGIES: Ativan [lorazepam] and No known allergies  MEDICATIONS:  Current Facility-Administered Medications  Medication Dose Route Frequency Provider Last Rate Last Dose  . acetaminophen (TYLENOL) tablet 500 mg  500 mg Oral Q6H PRN  Cherene Altes, MD   500 mg at 12/26/16 0018   Or  . acetaminophen (TYLENOL) suppository 325 mg  325 mg Rectal Q6H PRN Cherene Altes, MD      . bisacodyl (DULCOLAX) suppository 10 mg  10 mg Rectal Daily PRN Jonetta Osgood, MD      . cholecalciferol (VITAMIN D) tablet 2,000 Units  2,000 Units Oral BID Ainsley Spinner, PA-C   2,000 Units at 12/26/16 9244  . cloNIDine (CATAPRES) tablet 0.1 mg  0.1  mg Oral TID PRN Cherene Altes, MD      . enoxaparin (LOVENOX) injection 40 mg  40 mg Subcutaneous Q24H Altamese Boligee, MD   40 mg at 12/26/16 1126  . feeding supplement (BOOST / RESOURCE BREEZE) liquid 1 Container  1 Container Oral BID BM Ainsley Spinner, PA-C   1 Container at 12/25/16 1150  . folic acid (FOLVITE) tablet 1 mg  1 mg Oral Daily Ainsley Spinner, PA-C   1 mg at 12/26/16 5956  . hydrALAZINE (APRESOLINE) injection 10 mg  10 mg Intravenous Q6H PRN Joette Catching T, MD      . ipratropium-albuterol (DUONEB) 0.5-2.5 (3) MG/3ML nebulizer solution 3 mL  3 mL Nebulization Q6H PRN Jonetta Osgood, MD   3 mL at 12/23/16 1820  . lactated ringers infusion   Intravenous Continuous Nolon Nations, MD 10 mL/hr at 12/23/16 0930    . MEDLINE mouth rinse  15 mL Mouth Rinse BID Altamese Middlebush, MD   15 mL at 12/26/16 0939  . methocarbamol (ROBAXIN) tablet 500 mg  500 mg Oral Q6H PRN Ainsley Spinner, PA-C   500 mg at 12/26/16 0018   Or  . methocarbamol (ROBAXIN) 500 mg in dextrose 5 % 50 mL IVPB  500 mg Intravenous Q6H PRN Ainsley Spinner, PA-C      . metoprolol tartrate (LOPRESSOR) tablet 12.5 mg  12.5 mg Oral BID Cherene Altes, MD   12.5 mg at 12/26/16 0933  . morphine 4 MG/ML injection 1-2 mg  1-2 mg Intravenous Q2H PRN Ainsley Spinner, PA-C   2 mg at 12/25/16 2101  . multivitamin with minerals tablet 1 tablet  1 tablet Oral Daily Ainsley Spinner, PA-C   1 tablet at 12/26/16 0934  . oxyCODONE (Oxy IR/ROXICODONE) immediate release tablet 2.5-5 mg  2.5-5 mg Oral Q6H PRN Cherene Altes, MD   5 mg at 12/26/16  0018  . pantoprazole (PROTONIX) EC tablet 40 mg  40 mg Oral BID AC Ainsley Spinner, PA-C   40 mg at 12/26/16 0817  . polyethylene glycol (MIRALAX / GLYCOLAX) packet 17 g  17 g Oral Daily Jonetta Osgood, MD   17 g at 12/26/16 0934  . senna-docusate (Senokot-S) tablet 2 tablet  2 tablet Oral QHS Jonetta Osgood, MD   2 tablet at 12/25/16 2100  . thiamine (VITAMIN B-1) tablet 100 mg  100 mg Oral Daily Ainsley Spinner, PA-C   100 mg at 12/26/16 0935  . vitamin B-12 (CYANOCOBALAMIN) tablet 1,000 mcg  1,000 mcg Oral Daily Reyne Dumas, MD   1,000 mcg at 12/26/16 0935    REVIEW OF SYSTEMS:  On review of systems, the patient reports that she is doing well overall since her surgery. She denies significant pain in her right arm at this time. She denies any back pain. she denies any chest pain, shortness of breath, cough, fevers, chills, night sweats, unintended weight changes. She denies any bowel or bladder disturbances, and denies abdominal pain, nausea or vomiting. She denies any other musculoskeletal or joint aches or pains. She also denies any numbness in her chest wall. She reports some tingling occasionally in the left upper extremity. A complete review of systems is obtained and is otherwise negative.    PHYSICAL EXAM:  Wt Readings from Last 3 Encounters:  12/18/16 195 lb (88.5 kg)  05/12/12 155 lb 6.8 oz (70.5 kg)   Temp Readings from Last 3 Encounters:  12/26/16 98 F (36.7 C) (Oral)  12/19/16 98.7 F (37.1 C) (Oral)  05/14/12 98.7 F (37.1 C) (Oral)   BP Readings from Last 3 Encounters:  12/26/16 (!) 130/59  12/19/16 (!) 161/84  05/14/12 129/69   Pulse Readings from Last 3 Encounters:  12/26/16 89  12/19/16 (!) 111  05/14/12 82   Pain Assessment Pain Score:0/10  In general this is a slightly unkept appearing caucasian female in no acute distress. She is alert and oriented x4 and appropriate throughout the examination. HEENT reveals that the patient is normocephalic, atraumatic.  EOMs are intact. PERRLA. Skin is intact without any evidence of gross lesions. Cardiopulmonary assessment is negative for acute distress and she exhibits normal effort. Lymphatic assessment is performed and reveals fullness in the left upper outer quadrant of the left breast with fullness in the left axilla. The left breast is retracted and has skin involvement of a large palpable mass, and an eschar is noted, though the patient denies known bleeding or drainage from this site. Abdomen is soft, non tender, non distended. Lower extremities are negative for pretibial pitting edema, deep calf tenderness, cyanosis or clubbing. She is neurologically intact grossly and has a well healing left upper arm incision noted. No erythema is noted. She has 4/5 grip strength on the right, and 5/5 on the left. She has intact sensory perception in bilateral upper and lower extremities. She has 5/5 strength in the left upper extremity, and of bilateral lower extremities. Her right upper extremity is not further tested due to recent surgery.   KPS = 40  100 - Normal; no complaints; no evidence of disease. 90   - Able to carry on normal activity; minor signs or symptoms of disease. 80   - Normal activity with effort; some signs or symptoms of disease. 1   - Cares for self; unable to carry on normal activity or to do active work. 60   - Requires occasional assistance, but is able to care for most of his personal needs. 50   - Requires considerable assistance and frequent medical care. 19   - Disabled; requires special care and assistance. 54   - Severely disabled; hospital admission is indicated although death not imminent. 69   - Very sick; hospital admission necessary; active supportive treatment necessary. 10   - Moribund; fatal processes progressing rapidly. 0     - Dead  Karnofsky DA, Abelmann [place], Craver LS and [facility] [phone]) The use of the nitrogen mustards in the palliative treatment of carcinoma: with  particular reference to bronchogenic carcinoma Cancer 1 634-56  LABORATORY DATA:  Lab Results  Component Value Date   WBC 8.0 12/23/2016   HGB 12.5 12/23/2016   HCT 39.2 12/23/2016   MCV 107.7 (H) 12/23/2016   PLT 267 12/23/2016   Lab Results  Component Value Date   NA 136 12/24/2016   K 4.1 12/24/2016   CL 99 (L) 12/24/2016   CO2 28 12/24/2016   Lab Results  Component Value Date   ALT 31 12/23/2016   AST 38 12/23/2016   ALKPHOS 107 12/23/2016   BILITOT 0.9 12/23/2016     RADIOGRAPHY: Dg Forearm Right  Result Date: 12/20/2016 CLINICAL DATA:  Right arm fracture EXAM: RIGHT FOREARM - 2 VIEW COMPARISON:  None. FINDINGS: No fracture or dislocation is seen in the forearm. Distal humeral fracture may be visible on one of the two views, but is better demonstrated on the prior study. Overlying cast obscures fine osseous detail. IMPRESSION: No fracture or dislocation is seen in the forearm. Known distal humerus fracture  is not well visualized. Electronically Signed   By: Julian Hy M.D.   On: 12/20/2016 11:19   Ct Chest W Contrast  Result Date: 12/24/2016 CLINICAL DATA:  Inpatient admitted with pathologic right humerus fracture and left breast mass. Suspected metastatic breast cancer. Staging. EXAM: CT CHEST, ABDOMEN, AND PELVIS WITH CONTRAST TECHNIQUE: Multidetector CT imaging of the chest, abdomen and pelvis was performed following the standard protocol during bolus administration of intravenous contrast. CONTRAST:  139m ISOVUE-300 IOPAMIDOL (ISOVUE-300) INJECTION 61% COMPARISON:  12/17/2016 chest CT. FINDINGS: CT CHEST FINDINGS Cardiovascular: Top-normal heart size. No significant pericardial fluid/thickening. Atherosclerotic nonaneurysmal thoracic aorta. Normal caliber pulmonary arteries. No central pulmonary emboli. Mediastinum/Nodes: No discrete thyroid nodules. Unremarkable esophagus. No pathologically enlarged axillary, mediastinal or hilar lymph nodes. Lungs/Pleura: No  pneumothorax. No pleural effusion. Right middle lobe 5 mm (series 4/image 81) and 4 mm lingular (series 4/image 68) solid pulmonary nodules, both stable since 12/17/2016. Subsegmental atelectasis in the dependent lungs bilaterally. Otherwise no acute consolidative airspace disease, lung masses or additional significant pulmonary nodules. Musculoskeletal: Expansile lytic destructive bone lesion at the right third costovertebral junction (series 4/image 20) with mass-effect on the right thoracic spinal canal. Healed deformities of the lateral left seventh and eighth ribs. Partially visualized surgical fixation of the right distal humerus. Marked thoracic spondylosis . Irregular solid 5.3 x 2.8 cm outer left breast mass, extending to the skin (series 3/image 30). CT ABDOMEN PELVIS FINDINGS Hepatobiliary: Diffuse hepatic steatosis. The known liver masses (at least 4) in the liver seen on the 12/17/2016 chest CT study are poorly visualized on the routine phase sequence through the liver on today's scan, probably due to differences in contrast timing. Representative liver masses include a 1.7 x 1.5 cm liver dome lesion (series 3/ image 43), a 1.5 x 1.1 cm segment 4B left liver lobe lesion (series 8/ image 7) and a 0.9 x 0.7 cm segment 3 left liver lobe lesion (series 8/image 8). Normal gallbladder with no radiopaque cholelithiasis. No biliary ductal dilatation. Pancreas: Normal, with no mass or duct dilation. Spleen: Normal size. No mass. Adrenals/Urinary Tract: Suggestion of an indeterminate 1.2 cm lower left adrenal nodule (series 3/ image 54) with density 875 HU. No additional discrete adrenal nodules. No hydronephrosis. Mild segmental renal cortical scarring in the upper right kidney. No renal mass. Bladder collapsed by indwelling Foley catheter, limiting bladder evaluation. Grossly normal bladder. Stomach/Bowel: Grossly normal stomach. Normal caliber small bowel with no small bowel wall thickening. Appendectomy.  Normal large bowel with no diverticulosis, large bowel wall thickening or pericolonic fat stranding. Vascular/Lymphatic: Atherosclerotic nonaneurysmal abdominal aorta. Patent portal, splenic, hepatic and renal veins. No pathologically enlarged lymph nodes in the abdomen or pelvis. Reproductive: Suggestion of abnormal endometrial thickening (approximately 10 mm). No adnexal mass. Other: No pneumoperitoneum, ascites or focal fluid collection. Small fat containing umbilical hernia. Musculoskeletal: No aggressive appearing focal osseous lesions. Mild L3 and L4 superior vertebral compression fractures of indeterminate chronicity. Moderate lumbar spondylosis . IMPRESSION: 1. Irregular solid 5.3 cm upper left breast mass extending to the skin, suspicious for primary left breast malignancy. Recommend correlation with diagnostic mammographic evaluation. 2. Expansile lytic destructive bone lesion at the right third costovertebral junction with mass-effect on the right thoracic spinal canal, compatible with osseous metastasis. Consider correlation with thoracic spine MRI without and with IV contrast to evaluate for possible thoracic cord compression, as clinically warranted . 3. Two subcentimeter pulmonary nodules, indeterminate for metastases. Recommend attention on follow-up chest CT in 3 months. 4. Four scattered  small liver masses, suspicious for liver metastases. These could be further evaluated with MRI abdomen without and with IV contrast versus PET-CT, as clinically warranted . 5. Possible small left adrenal metastasis. 6. Suggestion of abnormal endometrial thickening (10 mm). Recommend correlation would transabdominal and transvaginal pelvic ultrasound. 7. Mild L3 and L4 vertebral compression fractures of indeterminate chronicity. 8. Additional chronic findings as above. Electronically Signed   By: Ilona Sorrel M.D.   On: 12/24/2016 18:07   Ct Angio Chest Pe W Or Wo Contrast  Result Date: 12/17/2016 CLINICAL DATA:   Shortness of Breath EXAM: CT ANGIOGRAPHY CHEST WITH CONTRAST TECHNIQUE: Multidetector CT imaging of the chest was performed using the standard protocol during bolus administration of intravenous contrast. Multiplanar CT image reconstructions and MIPs were obtained to evaluate the vascular anatomy. CONTRAST:  75 mL Isovue 370 nonionic COMPARISON:  Chest radiograph December 17, 2016 FINDINGS: Cardiovascular: There is no demonstrable pulmonary embolus. There is no thoracic aortic aneurysm or dissection. Main pulmonary outflow tract measures 3.1 cm in length. There is mild calcification at the origin of the left subclavian artery. Other visualized great vessels appear unremarkable. Pericardium is not appreciably thickened. There is left ventricular hypertrophy. Mediastinum/Nodes: Visualized thyroid appears unremarkable. There is no appreciable thoracic adenopathy. Lungs/Pleura: There is mild bibasilar atelectasis. There is no parenchymal lung edema or consolidation. No pleural effusion or pleural thickening evident. Upper Abdomen: There is diffuse hepatic steatosis. There is an enhancing focus in the anterior segment of the right lobe of the liver measuring 1.7 x 1.7 cm. A similar appearing lesion is noted near the dome of the liver in the medial segment left lobe measuring 1.5 x 1.3 cm. No other focal liver lesions are apparent ; note that liver is incompletely visualized. Visualized upper abdominal structures otherwise appear unremarkable. Musculoskeletal: There is degenerative change in the thoracic spine. There are no blastic or lytic bone lesions. There are several healed rib fractures on the left. Review of the MIP images confirms the above findings. IMPRESSION: No demonstrable pulmonary embolus. Prominence of the main pulmonary outflow tract suggests a degree of pulmonary arterial hypertension. No adenopathy. No lung edema or consolidation.  Bibasilar atelectasis noted. Enhancing lesions in the liver near the dome  noted. Etiology uncertain. This finding warrants nonemergent pre and serial post-contrast CT or MR of the liver to further evaluate. Old healed rib fractures on the left. Left ventricular hypertrophy. Electronically Signed   By: Lowella Grip III M.D.   On: 12/17/2016 13:51   Mr Cervical Spine W Wo Contrast  Result Date: 12/25/2016 CLINICAL DATA:  Metastatic disease.  Breast mass. EXAM: MRI TOTAL SPINE WITHOUT AND WITH CONTRAST TECHNIQUE: Multisequence MR imaging of the spine from the cervical spine to the sacrum was performed prior to and following IV contrast administration for evaluation of spinal metastatic disease. CONTRAST:  75m MULTIHANCE GADOBENATE DIMEGLUMINE 529 MG/ML IV SOLN COMPARISON:  CT chest abdomen pelvis 12/24/2016 FINDINGS: MRI CERVICAL SPINE FINDINGS Image quality degraded by motion. Alignment: Normal Vertebrae: No fracture identified. Enhancing lesion C7 vertebral body compatible with metastatic disease. No epidural extension. T3 metastatic disease also noted. Cord: Negative for cord compression.  Cord signal normal. Posterior Fossa, vertebral arteries, paraspinal tissues: Negative Disc levels: Mild disc degeneration at C4-5 and C5-6 and C6-7. Mild facet degeneration on the left at C4-5. Left foraminal narrowing C5-6 due to disc protrusion and spurring. MRI THORACIC SPINE FINDINGS Alignment:  Normal Vertebrae: Metastatic disease throughout the T3 vertebral body extending into the pedicle and  posterior elements. There is also tumor in the right foramen at T2-3 and T3-4 which could cause radicular symptoms. Slight epidural tumor without cord compression. Tumor also present in the spinous processes of T1, T2, T3, T8. Small tumor deposit T5 vertebral body and right superior articulating facet. Tumor in the right sixth rib. Small enhancing lesions T12 vertebral body compatible with metastatic disease. Accentuated dorsal kyphosis.  No pathologic fracture Cord:  Negative for cord compression.   Spinal cord signal normal. Paraspinal and other soft tissues: Negative Disc levels: Multilevel thoracic disc degeneration without significant spinal stenosis. MRI LUMBAR SPINE FINDINGS Segmentation:  Normal Alignment:  Normal Vertebrae: Enhancing metastatic disease is seen in the lumbar spine. Enhancing lesion on the right at L2. Diffuse vertebral body enhancement of L3 with pathologic fracture. Enhancing tumor extends in the pedicle and posterior elements on the right without foraminal encroachment. Small enhancing lesion S2 on the left. Mild fracture of L4 appears chronic and benign. Conus medullaris: Extends to the L1-2 level and appears normal. Paraspinal and other soft tissues: Diffuse paraspinous muscle atrophy. No retroperitoneal adenopathy Disc levels: L1-2:  Negative L2-3:  Disc and facet degeneration with moderate spinal stenosis L3-4:  Disc and facet degeneration with severe spinal stenosis L4-5:  Disc and facet degeneration with moderate spinal stenosis L5-S1:  Severe facet degeneration without stenosis. IMPRESSION: Metastatic disease C7 vertebral body without epidural tumor or fracture Multiple metastatic deposits in the thoracic and lumbar spine. Prominent tumor is present on the right at T3 extending into the soft tissues as well as the foramina on the right at T2-3 and T3-4. See above detailed Advanced lumbar degenerative changes with spinal stenosis at L2-3, L3-4, L4-5 Electronically Signed   By: Franchot Gallo M.D.   On: 12/25/2016 11:51   Mr Thoracic Spine W Wo Contrast  Result Date: 12/25/2016 CLINICAL DATA:  Metastatic disease.  Breast mass. EXAM: MRI TOTAL SPINE WITHOUT AND WITH CONTRAST TECHNIQUE: Multisequence MR imaging of the spine from the cervical spine to the sacrum was performed prior to and following IV contrast administration for evaluation of spinal metastatic disease. CONTRAST:  37m MULTIHANCE GADOBENATE DIMEGLUMINE 529 MG/ML IV SOLN COMPARISON:  CT chest abdomen pelvis  12/24/2016 FINDINGS: MRI CERVICAL SPINE FINDINGS Image quality degraded by motion. Alignment: Normal Vertebrae: No fracture identified. Enhancing lesion C7 vertebral body compatible with metastatic disease. No epidural extension. T3 metastatic disease also noted. Cord: Negative for cord compression.  Cord signal normal. Posterior Fossa, vertebral arteries, paraspinal tissues: Negative Disc levels: Mild disc degeneration at C4-5 and C5-6 and C6-7. Mild facet degeneration on the left at C4-5. Left foraminal narrowing C5-6 due to disc protrusion and spurring. MRI THORACIC SPINE FINDINGS Alignment:  Normal Vertebrae: Metastatic disease throughout the T3 vertebral body extending into the pedicle and posterior elements. There is also tumor in the right foramen at T2-3 and T3-4 which could cause radicular symptoms. Slight epidural tumor without cord compression. Tumor also present in the spinous processes of T1, T2, T3, T8. Small tumor deposit T5 vertebral body and right superior articulating facet. Tumor in the right sixth rib. Small enhancing lesions T12 vertebral body compatible with metastatic disease. Accentuated dorsal kyphosis.  No pathologic fracture Cord:  Negative for cord compression.  Spinal cord signal normal. Paraspinal and other soft tissues: Negative Disc levels: Multilevel thoracic disc degeneration without significant spinal stenosis. MRI LUMBAR SPINE FINDINGS Segmentation:  Normal Alignment:  Normal Vertebrae: Enhancing metastatic disease is seen in the lumbar spine. Enhancing lesion on the right at  L2. Diffuse vertebral body enhancement of L3 with pathologic fracture. Enhancing tumor extends in the pedicle and posterior elements on the right without foraminal encroachment. Small enhancing lesion S2 on the left. Mild fracture of L4 appears chronic and benign. Conus medullaris: Extends to the L1-2 level and appears normal. Paraspinal and other soft tissues: Diffuse paraspinous muscle atrophy. No  retroperitoneal adenopathy Disc levels: L1-2:  Negative L2-3:  Disc and facet degeneration with moderate spinal stenosis L3-4:  Disc and facet degeneration with severe spinal stenosis L4-5:  Disc and facet degeneration with moderate spinal stenosis L5-S1:  Severe facet degeneration without stenosis. IMPRESSION: Metastatic disease C7 vertebral body without epidural tumor or fracture Multiple metastatic deposits in the thoracic and lumbar spine. Prominent tumor is present on the right at T3 extending into the soft tissues as well as the foramina on the right at T2-3 and T3-4. See above detailed Advanced lumbar degenerative changes with spinal stenosis at L2-3, L3-4, L4-5 Electronically Signed   By: Franchot Gallo M.D.   On: 12/25/2016 11:51   Mr Lumbar Spine W Wo Contrast  Result Date: 12/25/2016 CLINICAL DATA:  Metastatic disease.  Breast mass. EXAM: MRI TOTAL SPINE WITHOUT AND WITH CONTRAST TECHNIQUE: Multisequence MR imaging of the spine from the cervical spine to the sacrum was performed prior to and following IV contrast administration for evaluation of spinal metastatic disease. CONTRAST:  55m MULTIHANCE GADOBENATE DIMEGLUMINE 529 MG/ML IV SOLN COMPARISON:  CT chest abdomen pelvis 12/24/2016 FINDINGS: MRI CERVICAL SPINE FINDINGS Image quality degraded by motion. Alignment: Normal Vertebrae: No fracture identified. Enhancing lesion C7 vertebral body compatible with metastatic disease. No epidural extension. T3 metastatic disease also noted. Cord: Negative for cord compression.  Cord signal normal. Posterior Fossa, vertebral arteries, paraspinal tissues: Negative Disc levels: Mild disc degeneration at C4-5 and C5-6 and C6-7. Mild facet degeneration on the left at C4-5. Left foraminal narrowing C5-6 due to disc protrusion and spurring. MRI THORACIC SPINE FINDINGS Alignment:  Normal Vertebrae: Metastatic disease throughout the T3 vertebral body extending into the pedicle and posterior elements. There is also  tumor in the right foramen at T2-3 and T3-4 which could cause radicular symptoms. Slight epidural tumor without cord compression. Tumor also present in the spinous processes of T1, T2, T3, T8. Small tumor deposit T5 vertebral body and right superior articulating facet. Tumor in the right sixth rib. Small enhancing lesions T12 vertebral body compatible with metastatic disease. Accentuated dorsal kyphosis.  No pathologic fracture Cord:  Negative for cord compression.  Spinal cord signal normal. Paraspinal and other soft tissues: Negative Disc levels: Multilevel thoracic disc degeneration without significant spinal stenosis. MRI LUMBAR SPINE FINDINGS Segmentation:  Normal Alignment:  Normal Vertebrae: Enhancing metastatic disease is seen in the lumbar spine. Enhancing lesion on the right at L2. Diffuse vertebral body enhancement of L3 with pathologic fracture. Enhancing tumor extends in the pedicle and posterior elements on the right without foraminal encroachment. Small enhancing lesion S2 on the left. Mild fracture of L4 appears chronic and benign. Conus medullaris: Extends to the L1-2 level and appears normal. Paraspinal and other soft tissues: Diffuse paraspinous muscle atrophy. No retroperitoneal adenopathy Disc levels: L1-2:  Negative L2-3:  Disc and facet degeneration with moderate spinal stenosis L3-4:  Disc and facet degeneration with severe spinal stenosis L4-5:  Disc and facet degeneration with moderate spinal stenosis L5-S1:  Severe facet degeneration without stenosis. IMPRESSION: Metastatic disease C7 vertebral body without epidural tumor or fracture Multiple metastatic deposits in the thoracic and lumbar spine.  Prominent tumor is present on the right at T3 extending into the soft tissues as well as the foramina on the right at T2-3 and T3-4. See above detailed Advanced lumbar degenerative changes with spinal stenosis at L2-3, L3-4, L4-5 Electronically Signed   By: Franchot Gallo M.D.   On: 12/25/2016  11:51   Ct Abdomen Pelvis W Contrast  Result Date: 12/24/2016 CLINICAL DATA:  Inpatient admitted with pathologic right humerus fracture and left breast mass. Suspected metastatic breast cancer. Staging. EXAM: CT CHEST, ABDOMEN, AND PELVIS WITH CONTRAST TECHNIQUE: Multidetector CT imaging of the chest, abdomen and pelvis was performed following the standard protocol during bolus administration of intravenous contrast. CONTRAST:  158m ISOVUE-300 IOPAMIDOL (ISOVUE-300) INJECTION 61% COMPARISON:  12/17/2016 chest CT. FINDINGS: CT CHEST FINDINGS Cardiovascular: Top-normal heart size. No significant pericardial fluid/thickening. Atherosclerotic nonaneurysmal thoracic aorta. Normal caliber pulmonary arteries. No central pulmonary emboli. Mediastinum/Nodes: No discrete thyroid nodules. Unremarkable esophagus. No pathologically enlarged axillary, mediastinal or hilar lymph nodes. Lungs/Pleura: No pneumothorax. No pleural effusion. Right middle lobe 5 mm (series 4/image 81) and 4 mm lingular (series 4/image 68) solid pulmonary nodules, both stable since 12/17/2016. Subsegmental atelectasis in the dependent lungs bilaterally. Otherwise no acute consolidative airspace disease, lung masses or additional significant pulmonary nodules. Musculoskeletal: Expansile lytic destructive bone lesion at the right third costovertebral junction (series 4/image 20) with mass-effect on the right thoracic spinal canal. Healed deformities of the lateral left seventh and eighth ribs. Partially visualized surgical fixation of the right distal humerus. Marked thoracic spondylosis . Irregular solid 5.3 x 2.8 cm outer left breast mass, extending to the skin (series 3/image 30). CT ABDOMEN PELVIS FINDINGS Hepatobiliary: Diffuse hepatic steatosis. The known liver masses (at least 4) in the liver seen on the 12/17/2016 chest CT study are poorly visualized on the routine phase sequence through the liver on today's scan, probably due to differences  in contrast timing. Representative liver masses include a 1.7 x 1.5 cm liver dome lesion (series 3/ image 43), a 1.5 x 1.1 cm segment 4B left liver lobe lesion (series 8/ image 7) and a 0.9 x 0.7 cm segment 3 left liver lobe lesion (series 8/image 8). Normal gallbladder with no radiopaque cholelithiasis. No biliary ductal dilatation. Pancreas: Normal, with no mass or duct dilation. Spleen: Normal size. No mass. Adrenals/Urinary Tract: Suggestion of an indeterminate 1.2 cm lower left adrenal nodule (series 3/ image 54) with density 875 HU. No additional discrete adrenal nodules. No hydronephrosis. Mild segmental renal cortical scarring in the upper right kidney. No renal mass. Bladder collapsed by indwelling Foley catheter, limiting bladder evaluation. Grossly normal bladder. Stomach/Bowel: Grossly normal stomach. Normal caliber small bowel with no small bowel wall thickening. Appendectomy. Normal large bowel with no diverticulosis, large bowel wall thickening or pericolonic fat stranding. Vascular/Lymphatic: Atherosclerotic nonaneurysmal abdominal aorta. Patent portal, splenic, hepatic and renal veins. No pathologically enlarged lymph nodes in the abdomen or pelvis. Reproductive: Suggestion of abnormal endometrial thickening (approximately 10 mm). No adnexal mass. Other: No pneumoperitoneum, ascites or focal fluid collection. Small fat containing umbilical hernia. Musculoskeletal: No aggressive appearing focal osseous lesions. Mild L3 and L4 superior vertebral compression fractures of indeterminate chronicity. Moderate lumbar spondylosis . IMPRESSION: 1. Irregular solid 5.3 cm upper left breast mass extending to the skin, suspicious for primary left breast malignancy. Recommend correlation with diagnostic mammographic evaluation. 2. Expansile lytic destructive bone lesion at the right third costovertebral junction with mass-effect on the right thoracic spinal canal, compatible with osseous metastasis. Consider  correlation with thoracic  spine MRI without and with IV contrast to evaluate for possible thoracic cord compression, as clinically warranted . 3. Two subcentimeter pulmonary nodules, indeterminate for metastases. Recommend attention on follow-up chest CT in 3 months. 4. Four scattered small liver masses, suspicious for liver metastases. These could be further evaluated with MRI abdomen without and with IV contrast versus PET-CT, as clinically warranted . 5. Possible small left adrenal metastasis. 6. Suggestion of abnormal endometrial thickening (10 mm). Recommend correlation would transabdominal and transvaginal pelvic ultrasound. 7. Mild L3 and L4 vertebral compression fractures of indeterminate chronicity. 8. Additional chronic findings as above. Electronically Signed   By: Ilona Sorrel M.D.   On: 12/24/2016 18:07   Dg Chest Port 1 View  Result Date: 12/23/2016 CLINICAL DATA:  Shortness of breath EXAM: PORTABLE CHEST 1 VIEW COMPARISON:  12/17/2016 FINDINGS: Linear atelectasis at the medial left base. Cannot exclude small left pleural effusion. Mild cardiomegaly with atherosclerosis. No overt pulmonary edema. No pneumothorax. Old left rib fracture. IMPRESSION: 1. Cardiomegaly without edema or infiltrate 2. Subsegmental atelectasis at the left lung base. Electronically Signed   By: Donavan Foil M.D.   On: 12/23/2016 19:08   Dg Chest Portable 1 View  Result Date: 12/17/2016 CLINICAL DATA:  Shortness of breath and tachycardia EXAM: PORTABLE CHEST 1 VIEW COMPARISON:  05/13/2012 FINDINGS: Cardiomegaly noted. There is no evidence of focal airspace disease, pulmonary edema, suspicious pulmonary nodule/mass, pleural effusion, or pneumothorax. No acute bony abnormalities are identified. IMPRESSION: Cardiomegaly without evidence of acute cardiopulmonary disease. Electronically Signed   By: Margarette Canada M.D.   On: 12/17/2016 12:11   Dg Bone Survey Met  Result Date: 12/23/2016 CLINICAL DATA:  (Breast lesion.  Pathologic fracture of the right humerus. Status post ORIF of the right humerus. Evaluate for metastasis. EXAM: METASTATIC BONE SURVEY COMPARISON:  CXR and right humerus radiographs from 12/17/2016, left ankle radiographs from 05/11/2012, left wrist radiographs small 05/11/2012 FINDINGS: Lateral skull: Occipital lytic skull lucency measuring 13 x 8 mm. No fracture. Both shoulders: Negative for lytic or blastic disease. No acute fracture nor dislocation. Left old sixth and seventh rib fractures are incidentally included. Both humeri: Pathologic fracture through moth eaten lytic lesion of the distal right humeral diaphysis transfixed by plate and screws. Lesion at the junction of the middle and distal third of the humeral shaft. Left humerus is negative. Radius and ulna: Negative for lytic or blastic disease. Volar plate and screw fixation across distal left radius fracture. Cervical spine AP and lateral: Degenerative disc and facet arthropathy. No suspicious osseous abnormality. Thoracic spine: Multilevel degenerative disc and endplate changes consistent with spondylosis. Lumbar spine: Chronic appearing superior endplate compressions of L1, L3 and L4 without retropulsion. No lytic or blastic disease. Lower lumbar facet arthropathy from L3 through S1. Slight disc space narrowing at L5-S1. PA chest: Normal size heart. Aortic atherosclerosis. Atelectasis at the lung bases. No dominant mass. Elevation of the right hemidiaphragm versus eventration. Old left-sided rib fractures. AP pelvis: Lower lumbar facet arthropathy. Intact SI joints and pubic symphysis with osteoarthritis of the pubic symphysis. Old right inferior pubic ramus fracture. No suspicious lytic or blastic disease of the pelvis and included hips. Both femora, tibia and fibula: Negative for lytic or blastic disease. ORIF of left bimalleolar fractures. IMPRESSION: 13 x 8 mm lytic focus involving the occiput of the skull which could be a normal variant such is  an arachnoid granulation. CT may help for further correlation. No additional lytic or blastic disease are apparent. Pathologic fracture  transfixed by plate and screws involving the distal right humeral shaft. ORIF of the distal left radius and left bimalleolar fractures are also noted. Cervical, thoracic and lumbar spondylosis. Chronic benign-appearing endplate compressions of L1, L3 and L4. Electronically Signed   By: Ashley Royalty M.D.   On: 12/23/2016 17:11   Dg Humerus Right  Result Date: 12/23/2016 CLINICAL DATA:  OPEN REDUCTION INTERNAL FIXATION (ORIF) DISTAL HUMERUS FRACTURE (Right Arm Upper). Radiation Safety Timeout performed by BIW. Fluoro time 12 seconds. EXAM: DG C-ARM 61-120 MIN; RIGHT HUMERUS - 2+ VIEW COMPARISON:  12/20/2016 FINDINGS: Patient has undergone screw plate fixation of right humerus fracture. A cortical screw traverses the fracture site. There is near anatomic alignment. Elbow is unremarkable in appearance. IMPRESSION: Status post ORIF of the right humerus. Lucency at the fracture site, raising the question of pathologic fracture. Correlation with history is recommended. Electronically Signed   By: Nolon Nations M.D.   On: 12/23/2016 13:53   Dg Humerus Right  Result Date: 12/17/2016 CLINICAL DATA:  Acute onset pain while lifting heavy object EXAM: RIGHT HUMERUS - 2+ VIEW COMPARISON:  None. FINDINGS: Frontal and lateral views were obtained. There is an obliquely oriented fracture of the distal humerus with medial angulation and anterior displacement of the distal fracture fragment with respect proximal fragment. No other fractures. No dislocations. No abnormal periosteal reaction or appreciable arthropathy. IMPRESSION: Obliquely oriented fracture distal humerus with displacement angulation of fracture fragments. No dislocation. No abnormal periosteal reaction. No appreciable arthropathic change. Electronically Signed   By: Lowella Grip III M.D.   On: 12/17/2016 11:43   Dg Hand  Complete Left  Result Date: 12/20/2016 CLINICAL DATA:  Left hand swelling EXAM: LEFT HAND - COMPLETE 3+ VIEW COMPARISON:  None. FINDINGS: No evidence of acute fracture or dislocation. Status post ORIF of the distal radius. Moderate degenerative changes at the 1st carpometacarpal joint. The visualized soft tissues are unremarkable. IMPRESSION: No acute osseus abnormality is seen. Status post ORIF of the distal radius. Moderate degenerative changes of the 1st carpometacarpal joint. Electronically Signed   By: Julian Hy M.D.   On: 12/20/2016 11:10   Dg C-arm 1-60 Min  Result Date: 12/23/2016 CLINICAL DATA:  OPEN REDUCTION INTERNAL FIXATION (ORIF) DISTAL HUMERUS FRACTURE (Right Arm Upper). Radiation Safety Timeout performed by BIW. Fluoro time 12 seconds. EXAM: DG C-ARM 61-120 MIN; RIGHT HUMERUS - 2+ VIEW COMPARISON:  12/20/2016 FINDINGS: Patient has undergone screw plate fixation of right humerus fracture. A cortical screw traverses the fracture site. There is near anatomic alignment. Elbow is unremarkable in appearance. IMPRESSION: Status post ORIF of the right humerus. Lucency at the fracture site, raising the question of pathologic fracture. Correlation with history is recommended. Electronically Signed   By: Nolon Nations M.D.   On: 12/23/2016 13:53      IMPRESSION/PLAN: 1. 66 y.o. woman with putative metastatic Stage IV breast cancer to right humerus, T3, and L3 vertebral bodies, and liver. Dr. Tammi Klippel has reviewed the records and imaging studies to date. We are awaiting her final pathology from her recent surgery. We would anticipate a role for radiotherapy in the palliative setting to the right humerus, as well as to the T3 and L3 vertebral bodies. We would like to hold off for two weeks in treating the right humerus to allow for appropriate healing. We will follow up with this in the outpatient setting. She could simulate next week in lieu of this, and we will also consider treatment of her  disease at  T3 and L3 at the same time. If she develops new onset of neuro symptoms prior, we would consider moving forward sooner to the spine with radiotherapy. She is interested in receiving treatment in [place] but will need help with rides and transportation initially. We will plan to see her as an outpatient but would be happy to see her sooner while admitted if she develops neuro symptoms. I have written for nursing to perform neuro checks regularly as well.  2. Alcohol abuse and symptoms of withdrawal upon admission. We appreciate the primary team's assessment and treatment of this.   In a visit lasting 70 minutes, greater than 50% of the time was spent face to face discussing radiotherapy options and in floor time coordinating the patient's care.     Carola Rhine, PAC

## 2016-12-26 NOTE — NC FL2 (Signed)
[place] LEVEL OF CARE SCREENING TOOL     IDENTIFICATION  Patient Name: Taylor Brown Birthdate: [birth year] Sex: female Admission Date (Current Location): 12/19/2016  [facility] and [place] Number:  Herbalist and Address:  [facility], [place] [address] [place], [place] [address]      Provider Number: [id number]  Attending Physician Name and Address:  Reyne Dumas, MD  Relative Name and Phone Number:       Current Level of Care: Hospital Recommended Level of Care: [place] Prior Approval Number:    Date Approved/Denied: 12/26/16 PASRR Number: [id number]  Discharge Plan: SNF    Current Diagnoses: Patient Active Problem List   Diagnosis Date Noted  . Left breast mass 12/26/2016  . Acute encephalopathy 12/20/2016  . Alcohol abuse 12/20/2016  . Essential hypertension 12/20/2016  . Hypokalemia 12/20/2016  . Nicotine dependence 12/20/2016  . 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 Liver lesion 12/20/2016  . Right supracondylar humerus fracture 12/19/2016  . Hematochezia 12/17/2016  . GI bleed 12/17/2016  . Alcohol dependence ([place]) 05/13/2012    Orientation RESPIRATION BLADDER Height & Weight     Self, Time, Situation, Place  O2 (Nasal Cannula 2L) Continent Weight:   Height:     BEHAVIORAL SYMPTOMS/MOOD NEUROLOGICAL BOWEL NUTRITION STATUS      Continent Diet (see DC Summary)  AMBULATORY STATUS COMMUNICATION OF NEEDS Skin   Extensive Assist Verbally Surgical wounds (Right Arm Closed Incision, compression wrap)                       Personal Care Assistance Level of Assistance  Bathing, Feeding, Dressing Bathing Assistance: Maximum assistance Feeding assistance: Limited assistance Dressing Assistance: Maximum assistance     Functional Limitations Info  Sight, Hearing, Speech Sight Info: Adequate Hearing Info: Adequate Speech Info: Adequate    SPECIAL CARE FACTORS  FREQUENCY  PT (By licensed PT), OT (By licensed OT)     PT Frequency: 5xweek OT Frequency: 5xweek            Contractures      Additional Factors Info  Code Status, Allergies Code Status Info: Full Allergies Info: ATIVAN LORAZEPAM, NO KNOWN ALLERGIES            Current Medications (12/26/2016):  This is the current hospital active medication list Current Facility-Administered Medications  Medication Dose Route Frequency Provider Last Rate Last Dose  . acetaminophen (TYLENOL) tablet 500 mg  500 mg Oral Q6H PRN Cherene Altes, MD   500 mg at 12/26/16 0018   Or  . acetaminophen (TYLENOL) suppository 325 mg  325 mg Rectal Q6H PRN Cherene Altes, MD      . bisacodyl (DULCOLAX) suppository 10 mg  10 mg Rectal Daily PRN Jonetta Osgood, MD      . cholecalciferol (VITAMIN D) tablet 2,000 Units  2,000 Units Oral BID Ainsley Spinner, PA-C   2,000 Units at 12/26/16 0881  . cloNIDine (CATAPRES) tablet 0.1 mg  0.1 mg Oral TID PRN Joette Catching T, MD      . enoxaparin (LOVENOX) injection 40 mg  40 mg Subcutaneous Q24H Altamese [place], MD   40 mg at 12/26/16 1126  . feeding supplement (BOOST / RESOURCE BREEZE) liquid 1 Container  1 Container Oral BID BM Ainsley Spinner, PA-C   1 Container at 12/25/16 1150  . folic acid (FOLVITE) tablet 1 mg  1 mg Oral Daily Eddie Dibbles,  Lanny Hurst, PA-C   1 mg at 12/26/16 0933  . hydrALAZINE (APRESOLINE) injection 10 mg  10 mg Intravenous Q6H PRN Joette Catching T, MD      . ipratropium-albuterol (DUONEB) 0.5-2.5 (3) MG/3ML nebulizer solution 3 mL  3 mL Nebulization Q6H PRN Jonetta Osgood, MD   3 mL at 12/23/16 1820  . lactated ringers infusion   Intravenous Continuous Nolon Nations, MD 10 mL/hr at 12/23/16 0930    . MEDLINE mouth rinse  15 mL Mouth Rinse BID Altamese [place], MD   15 mL at 12/26/16 0939  . methocarbamol (ROBAXIN) tablet 500 mg  500 mg Oral Q6H PRN Ainsley Spinner, PA-C   500 mg at 12/26/16 0018   Or  . methocarbamol (ROBAXIN) 500 mg in dextrose 5  % 50 mL IVPB  500 mg Intravenous Q6H PRN Ainsley Spinner, PA-C      . metoprolol tartrate (LOPRESSOR) tablet 12.5 mg  12.5 mg Oral BID Cherene Altes, MD   12.5 mg at 12/26/16 0933  . morphine 4 MG/ML injection 1-2 mg  1-2 mg Intravenous Q2H PRN Ainsley Spinner, PA-C   2 mg at 12/25/16 2101  . multivitamin with minerals tablet 1 tablet  1 tablet Oral Daily Ainsley Spinner, PA-C   1 tablet at 12/26/16 0934  . oxyCODONE (Oxy IR/ROXICODONE) immediate release tablet 2.5-5 mg  2.5-5 mg Oral Q6H PRN Cherene Altes, MD   5 mg at 12/26/16 0018  . pantoprazole (PROTONIX) EC tablet 40 mg  40 mg Oral BID AC Ainsley Spinner, PA-C   40 mg at 12/26/16 0817  . polyethylene glycol (MIRALAX / GLYCOLAX) packet 17 g  17 g Oral Daily Jonetta Osgood, MD   17 g at 12/26/16 0934  . senna-docusate (Senokot-S) tablet 2 tablet  2 tablet Oral QHS Jonetta Osgood, MD   2 tablet at 12/25/16 2100  . thiamine (VITAMIN B-1) tablet 100 mg  100 mg Oral Daily Ainsley Spinner, PA-C   100 mg at 12/26/16 0935  . vitamin B-12 (CYANOCOBALAMIN) tablet 1,000 mcg  1,000 mcg Oral Daily Reyne Dumas, MD   1,000 mcg at 12/26/16 0935     Discharge Medications: Please see discharge summary for a list of discharge medications.  Relevant Imaging Results:  Relevant Lab Results:   Additional Information SS: [place], LCSW

## 2016-12-26 NOTE — Progress Notes (Signed)
Orthopedic Trauma Service Progress Note    Subjective:  No specific complaints Pain well controlled in R arm  MRIs completed yesterday and show significant metastatic disease around C7 and T3. Tumor noted extending into the soft tissues and foramina on the R at T2-3 and T3-4. Numerous metastatic deposits in T and L spines. Lesion also noted on S2 on the left   ROS As above  Objective:   VITALS:   Vitals:   12/25/16 1300 12/25/16 2049 12/26/16 0819 12/26/16 0933  BP: (!) 109/57 (!) 115/56 127/61 (!) 130/59  Pulse: 87 88 93 89  Resp: 18 16 16    Temp: 99.3 F (37.4 C) 98.5 F (36.9 C) 98.6 F (37 C)   TempSrc: Oral Oral Oral   SpO2: 99% 99% 97%     Intake/Output      06/17 0701 - 06/18 0700 06/18 0701 - 06/19 0700   P.O. 480 120   Total Intake 480 120   Urine 1000    Stool 0    Total Output 1000     Net -520 +120        Urine Occurrence 6 x    Stool Occurrence 3 x      LABS  Results for orders placed or performed during the hospital encounter of 12/19/16 (from the past 24 hour(s))  Urinalysis, Routine w reflex microscopic     Status: Abnormal   Collection Time: 12/25/16 12:12 PM  Result Value Ref Range   Color, Urine YELLOW YELLOW   APPearance HAZY (A) CLEAR   Specific Gravity, Urine 1.029 1.005 - 1.030   pH 7.0 5.0 - 8.0   Glucose, UA NEGATIVE NEGATIVE mg/dL   Hgb urine dipstick NEGATIVE NEGATIVE   Bilirubin Urine NEGATIVE NEGATIVE   Ketones, ur NEGATIVE NEGATIVE mg/dL   Protein, ur NEGATIVE NEGATIVE mg/dL   Nitrite NEGATIVE NEGATIVE   Leukocytes, UA NEGATIVE NEGATIVE     PHYSICAL EXAM:   Gen: resting comfortably in bed, NAD Ext:       Right Upper Extremity   Dressing removed  Incision looks fantastic   Ext warm  Ulnar and median nerves intact  No wrist extension noted  No thumb extension or abduction noted  Diminished radial nerve sensation   Ulnar and median nerve sensation intact   + radial pulse  Swelling well  controlled   Assessment/Plan: 3 Days Post-Op   Principal Problem:   Right supracondylar humerus fracture Active Problems:   Alcohol dependence (HCC)   GI bleed   Acute encephalopathy   Alcohol abuse   Essential hypertension   Hypokalemia   Nicotine dependence   Acute radial nerve palsy of right upper extremity   Liver lesion   Left breast mass   Anti-infectives    Start     Dose/Rate Route Frequency Ordered Stop   12/23/16 1700  ceFAZolin (ANCEF) IVPB 1 g/50 mL premix     1 g 100 mL/hr over 30 Minutes Intravenous Every 8 hours 12/23/16 1421 12/24/16 1041   12/23/16 1000  ceFAZolin (ANCEF) IVPB 2g/100 mL premix     2 g 200 mL/hr over 30 Minutes Intravenous  Once 12/22/16 0956 12/23/16 1003   12/20/16 0600  ceFAZolin (ANCEF) IVPB 2g/100 mL premix  Status:  Discontinued     2 g 200 mL/hr over 30 Minutes Intravenous To Short Stay 12/19/16 1611 12/21/16 0600    .  POD/HD#: 40  66 year old right-hand-dominant female with right supracondylar distal humerus fracture     -Right  supracondylar distal humerus fracture, closed. 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 Marlowe Aschoff eval    Pt now on medical service    - Medical issues              Hypertension                         BP is stable                           Metoprolol and norvasc that were started at [place] are still on hold                ? Ascites/ ? Liver disease                        as above    Lesions likely metastatic                Cardiovascular                          Left ventricular hypertrophy noted on CT scan at [facility] as well                         Echo consistent with grade 1 diastolic dysfunction                  Chronic alcohol use                         As above                         Withdrawal protocol                         Vitamin and mineral supplementation along with IV fluids     =              Nicotine dependence                         Absolutely no nicotine products of any kind                         Nicotine patch dc'd  Directly inhibits bone and wound healing                Social issues                         SW consult for wellness eval, ? eval by adult protective services given condition that pt arrived in at [facility] (disheveled, dirty, smelling of urine)                                     Pt reportedly lives alone with 12 small dogs (daughter counted 10)       - Pain management  Minimize narcotics    - ABL anemia/Hemodynamics             Stable                - DVT/PE prophylaxis:             lovenox per pharmacy consult      - Metabolic Bone Disease:             vitamin d deficiency              Elevated calcitriol likely indicates longs standing vitamin d deficiency and poor nutritional intake             Supplement    - FEN/GI prophylaxis/Foley/Lines:             protonix  Heart healthy diet               poor nutritional intake PTA   Added boost shakes BID      - Dispo:             Ortho issues currently addressed  Continue per medical services  Will follow along                Jari Pigg, PA-C [facility] [phone] (P) [phone] (O) 12/26/2016, 10:03 AM

## 2016-12-26 NOTE — Clinical Social Work Note (Signed)
Clinical Social Work Assessment  Patient Details  Name: Taylor Brown MRN: [id number] Date of Birth: 07/24/1950  Date of referral:  12/26/16               Reason for consult:  Facility Placement                Permission sought to share information with:    Permission granted to share information::  Yes, Verbal Permission Granted  Name::     Soil scientist::  SNF  Relationship::     Contact Information:     Housing/Transportation Living arrangements for the past 2 months:  Mobile Home Source of Information:  Patient Patient Interpreter Needed:  None Criminal Activity/Legal Involvement Pertinent to Current Situation/Hospitalization:  No - Comment as needed Significant Relationships:  Adult Children, Neighbor Lives with:  Self, Pets Do you feel safe going back to the place where you live?  No Need for family participation in patient care:  Yes (Comment)  Care giving concerns: Patient resided at home alone and has 12 yorkies in a 1-2 bedroom mobile home. There were concern about her safety and hygeine as she came hospital in a poor physical state and appeared unkept.  CSW discussed with patient her poor hygiene and advised that CSW would have to call APS. Patient aware of same and indicated that she was feeling "lazy" and did not care for her self at that time. Patient gave CSW permission to call daughter, Taylor Brown as well. Taylor Brown indicated that she is aware and has been trying to get her mom to move with her to [facility] and she has declined. CSW explained her role and that that APS will be notified for safety and concern of patient when she is in community. Daughter in agreement. CSW advised daughter that she can call APS at anytime as well when she is concerned about her mom's hygiene at home.    CSW called APS and was advised that since patient going to SNF they cannot do anything until patient returns home.  APS encouraged CSW to call them when patient returns home. CSW advised of her  role. APS advised there is nothing they can do at this time. CSW advised that daughter aware and may call if concerned when patient is DC home from SNF. At this time, APS did not pick up case for investigations.  Patient physically not safe to return home and will DC to SNF at appropriate time.  Social Worker assessment / plan:  CSW met with patient to discuss recommendations from the clinical team to DC to SNF. Patient agreeable to SNF placement. CW explained role and SNF placement/options. CSW discussed insurance auth and having the facility obtain insurance auth prior to DC.  CSW explained importance of selecting a SNF as soon as we have offers to give SNF time to obtain insurance ath. Patient agreeable to same. CSW obtained permission to send out offers to local SNF;s.  FL2 completed. PASSR obtained. Offers sent.   Employment status:  Retired Nurse, adult PT Recommendations:  [place] / Referral to community resources:  [place]  Patient/Family's Response to care:  CSW appreciative of assistance. No issues or concerns at this time.  Patient/Family's Understanding of and Emotional Response to Diagnosis, Current Treatment, and Prognosis:  Patient has good understanding of diagnosis, current treatment and prognosis and is hopeful that rehabilitation will address impairment. No issues or concerns at this time.  Emotional Assessment Appearance:  Appears stated age Attitude/Demeanor/Rapport:   (cooperative) Affect (typically observed):  Accepting, Appropriate Orientation:  Oriented to Self, Oriented to Place, Oriented to  Time, Oriented to Situation Alcohol / Substance use:  Not Applicable Psych involvement (Current and /or in the community):  No (Comment)  Discharge Needs  Concerns to be addressed:  Care Coordination Readmission within the last 30 days:  No Current discharge risk:  Physical Impairment, Dependent with  Mobility Barriers to Discharge:  No Barriers Identified   Normajean Baxter, LCSW 12/26/2016, 12:17 PM

## 2016-12-27 DIAGNOSIS — C787 Secondary malignant neoplasm of liver and intrahepatic bile duct: Secondary | ICD-10-CM

## 2016-12-27 DIAGNOSIS — C7951 Secondary malignant neoplasm of bone: Secondary | ICD-10-CM

## 2016-12-27 DIAGNOSIS — S42411A Displaced simple supracondylar fracture without intercondylar fracture of right humerus, initial encounter for closed fracture: Secondary | ICD-10-CM

## 2016-12-27 DIAGNOSIS — C7800 Secondary malignant neoplasm of unspecified lung: Secondary | ICD-10-CM

## 2016-12-27 LAB — COMPREHENSIVE METABOLIC PANEL
ALK PHOS: 100 U/L (ref 38–126)
ALT: 19 U/L (ref 14–54)
AST: 29 U/L (ref 15–41)
Albumin: 2.5 g/dL — ABNORMAL LOW (ref 3.5–5.0)
Anion gap: 9 (ref 5–15)
BUN: 9 mg/dL (ref 6–20)
CALCIUM: 8.7 mg/dL — AB (ref 8.9–10.3)
CHLORIDE: 99 mmol/L — AB (ref 101–111)
CO2: 29 mmol/L (ref 22–32)
CREATININE: 0.55 mg/dL (ref 0.44–1.00)
GFR calc non Af Amer: >60 mL/min (ref >60)
GLUCOSE: 105 mg/dL — AB (ref 65–99)
Potassium: 3.3 mmol/L — ABNORMAL LOW (ref 3.5–5.1)
SODIUM: 137 mmol/L (ref 135–145)
Total Bilirubin: 0.5 mg/dL (ref 0.3–1.2)
Total Protein: 5.8 g/dL — ABNORMAL LOW (ref 6.5–8.1)

## 2016-12-27 LAB — METHYLMALONIC ACID, SERUM: METHYLMALONIC ACID, QUANTITATIVE: 348 nmol/L (ref 0–378)

## 2016-12-27 LAB — CBC
HCT: 37.3 % (ref 36.0–46.0)
HEMOGLOBIN: 11.8 g/dL — AB (ref 12.0–15.0)
MCH: 33.9 pg (ref 26.0–34.0)
MCHC: 31.6 g/dL (ref 30.0–36.0)
MCV: 107.2 fL — ABNORMAL HIGH (ref 78.0–100.0)
Platelets: 409 10*3/uL — ABNORMAL HIGH (ref 150–400)
RBC: 3.48 MIL/uL — AB (ref 3.87–5.11)
RDW: 14.6 % (ref 11.5–15.5)
WBC: 7.7 10*3/uL (ref 4.0–10.5)

## 2016-12-27 MED ORDER — METHOCARBAMOL 500 MG PO TABS: 500.0000 mg | ORAL_TABLET | Freq: Four times a day (QID) | ORAL | 1 refills | Status: DC | PRN

## 2016-12-27 MED ORDER — POTASSIUM CHLORIDE 20 MEQ PO PACK: 20.0000 meq | PACK | Freq: Every day | ORAL | 0 refills | Status: DC

## 2016-12-27 MED ORDER — EXEMESTANE 25 MG PO TABS
25.0000 mg | ORAL_TABLET | Freq: Every day | ORAL | Status: DC
  Administered 2016-12-27 – 2016-12-28 (×2): 25 mg via ORAL
  Filled 2016-12-27 (×2): qty 1

## 2016-12-27 MED ORDER — ZOLEDRONIC ACID 4 MG/5ML IV CONC
4.0000 mg | Freq: Once | INTRAVENOUS | Status: AC
  Administered 2016-12-27: 4 mg via INTRAVENOUS
  Filled 2016-12-27: qty 5

## 2016-12-27 MED ORDER — FOLIC ACID 1 MG PO TABS: 1.0000 mg | ORAL_TABLET | Freq: Every day | ORAL | 1 refills | Status: AC

## 2016-12-27 MED ORDER — POTASSIUM CHLORIDE 20 MEQ PO PACK
20.0000 meq | PACK | Freq: Two times a day (BID) | ORAL | Status: DC
  Administered 2016-12-27 – 2016-12-28 (×3): 20 meq via ORAL
  Filled 2016-12-27 (×3): qty 1

## 2016-12-27 MED ORDER — THIAMINE HCL 100 MG PO TABS: 100.0000 mg | ORAL_TABLET | Freq: Every day | ORAL | 1 refills | Status: AC

## 2016-12-27 MED ORDER — BOOST / RESOURCE BREEZE PO LIQD: 1.0000 | Freq: Two times a day (BID) | ORAL | 0 refills | Status: DC

## 2016-12-27 MED ORDER — POLYETHYLENE GLYCOL 3350 17 G PO PACK: 17.0000 g | PACK | Freq: Every day | ORAL | 0 refills | Status: AC

## 2016-12-27 MED ORDER — BISACODYL 10 MG RE SUPP: 10.0000 mg | Freq: Every day | RECTAL | 0 refills | Status: AC | PRN

## 2016-12-27 MED ORDER — CYANOCOBALAMIN 1000 MCG PO TABS: 1000.0000 ug | ORAL_TABLET | Freq: Every day | ORAL | 1 refills | Status: AC

## 2016-12-27 MED ORDER — OXYCODONE HCL 5 MG PO TABS: 5.0000 mg | ORAL_TABLET | Freq: Four times a day (QID) | ORAL | 0 refills | Status: DC | PRN

## 2016-12-27 MED ORDER — EXEMESTANE 25 MG PO TABS: 25.0000 mg | ORAL_TABLET | Freq: Every day | ORAL | 0 refills | Status: DC

## 2016-12-27 NOTE — Progress Notes (Signed)
Occupational Therapy Treatment Patient Details Name: Taylor Brown MRN: [id number] DOB: 1950-07-28 Today's Date: 12/27/2016    History of present illness Pt is a 66 y.o. female presenting with R distal humerus fracture with acute radial nerve injury s/p fall. During hospital admission pt was found to have antral gastritis, liver lesions, and L breast mass with possible metastatic breast cancer. Pt is now s/p ORIF R distal humerus fracture and exploration of radial nerve on 6/15. PMHx: Arthritis, GERD, ORIF L ankle fx, ORIF L wrist fx. Pt currently being worked up for metatstatic breast cancer.    OT comments  Pt seen for splint check. Noted redness @ R ulnar styloid process. Splint adjusted to reduce pressure. Nsg educated to leave splints off at this time until @ 1700. Pt to wear splint for around 1 hour, then note if any redness on R styloid process. Will follow up  Tomorrow prior to DC.   Follow Up Recommendations  SNF;Supervision/Assistance - 24 hour    Equipment Recommendations  Other (comment)    Recommendations for Other Services PT consult    Precautions / Restrictions Precautions Precautions: Fall Precaution Comments: No active abduction R shoulder. Ok for passive abduction R shoulder. [place] for active and passive R shoulder flexion. Unrestricted ROM R elbow, forearm, wrist and hand. 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  Required Braces or Orthoses: Other Brace/Splint Other Brace/Splint: R pre-fab wrist cock up splint; sling when walking; radial n palsy splint for function                                                                   Cognition Arousal/Alertness: Awake/alert Behavior During Therapy: WFL for tasks assessed/performed Overall Cognitive Status: Within Functional Limits for tasks assessed                                          Exercises     Shoulder Instructions       Pertinent  Vitals/ Pain       Pain Assessment: 0-10 Pain Score: 4  Pain Location: back; RUE Pain Descriptors / Indicators: Aching;Sore Pain Intervention(s): Limited activity within patient's tolerance;Repositioned;Ice applied  Home Living                                          Prior Functioning/Environment              Frequency  Min 3X/week        Progress Toward Goals  OT Goals(current goals can now be found in the care plan section)  Progress towards OT goals: Progressing toward goals  Acute Rehab OT Goals Patient Stated Goal: to get better OT Goal Formulation: With patient Time For Goal Achievement: 01/07/17 Potential to Achieve Goals: Good ADL Goals Pt Will Perform Grooming: with min guard assist;sitting Pt Will Transfer to Toilet: with min assist;ambulating;bedside commode Pt Will Perform Toileting - Clothing Manipulation and hygiene: with min assist;sit to/from stand Pt/caregiver will Perform Home Exercise Program: Increased strength;Right Upper extremity;With written HEP provided;Independently Additional ADL  Goal #1: pt/family will demonstrate understanding of donning/doffing radial n palsy splint and verbalize use of splint Additional ADL Goal #2: Pt/family will verbalize understanding of ROM protocol for RUE per MD orders  Plan Discharge plan remains appropriate    Co-evaluation                 AM-PAC PT "6 Clicks" Daily Activity     Outcome Measure   Help from another person eating meals?: A Little Help from another person taking care of personal grooming?: A Lot Help from another person toileting, which includes using toliet, bedpan, or urinal?: A Lot Help from another person bathing (including washing, rinsing, drying)?: A Lot Help from another person to put on and taking off regular upper body clothing?: A Lot Help from another person to put on and taking off regular lower body clothing?: A Lot 6 Click Score: 13    End of Session     OT Visit Diagnosis: Unsteadiness on feet (R26.81);Other abnormalities of gait and mobility (R26.89);History of falling (Z91.81);Muscle weakness (generalized) (M62.81);Pain Pain - Right/Left: Right Pain - part of body: Arm   Activity Tolerance Patient tolerated treatment well   Patient Left in bed;with call bell/phone within reach   Nurse Communication Other (comment) (splint care)        Time: 3009-2330 OT Time Calculation (min): 22 min  Charges: OT General Charges $OT Visit: 1 Procedure  [facility], OT/L  [phone] 12/27/2016   Lene Mckay,HILLARY 12/27/2016, 4:00 PM

## 2016-12-27 NOTE — Progress Notes (Signed)
I have to believe that Ms. Mairena does have metastatic breast cancer. The total awesomeness of radiology was again on display yesterday when they're able to do the biopsy of the left breast mass.  Shockingly enough, her CA 27.29 was normal.  Her scans shows that she has very extensive disease. She has liver, lung, bone metastases.  Radiation oncology has seen her. I very much agree with radiation therapy to the affected areas.  The pathology from her right humerus is not back yet.  She does look much better. She sounds better.  She will definitely need Zometa. I will give her a dose while she is in the hospital.  I will "go out on a limb" and started on Aromasin. This is assuming that she has hormone receptor positive breast cancer. Given her age, I would think that this would be receptor positive. If not, then we will have issues.  She might be going to rehabilitation today. If she does go to rehabilitation, I will make sure that she stays on Aromasin. I will follow-up as an outpatient.  Her labs looked okay. Her calcium is 8.7 with an albumin of 2.5. Her potassium is 3.3.  Again, she does look better.  Lattie Haw, MD  Psalm 125:4

## 2016-12-27 NOTE — Progress Notes (Signed)
Occupational Therapy Treatmen Patient Details Name: Taylor Brown MRN: [id number] DOB: 08-20-1950 Today's Date: 12/27/2016    History of Present Illness Pt is a 66 y.o. female presenting with R distal humerus fracture with acute radial nerve injury s/p fall. During hospital admission pt was found to have antral gastritis, liver lesions, and L breast mass with possible metastatic breast cancer. Pt is now s/p ORIF R distal humerus fracture and exploration of radial nerve on 6/15. PMHx: Arthritis, GERD, ORIF L ankle fx, ORIF L wrist fx. Pt currently being worked up for metatstatic breast cancer.    Clinical Impression    Pt seen x 2 this am for education on RUE management, ROM, edema control and positioning. Dynamic radial n palsy splint fabricated. Educated pt/family on use of dynamic splint and use of wrist splint as needed during the day when not wearing dynamic splint and for use when sleeping at night. Discussed with nsg. Will return after lunch for a splint check. At this time, pt tolerating without complaints and is excited about being able to straighten her fingers with use of the splint.     Follow Up Recommendations  SNF;Supervision/Assistance - 24 hour    Equipment Recommendations  Other (comment) (TBA)    Recommendations for Other Services PT consult     Precautions / Restrictions Precautions Precautions: Fall Precaution Comments: No active abduction R shoulder. Ok for passive abduction R shoulder. [place] for active and passive R shoulder flexion. Unrestricted ROM R elbow, forearm, wrist and hand. 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  Required Braces or Orthoses: Other Brace/Splint Other Brace/Splint: R pre-fab wrist cock up splint; sling when walking; radial n palsy splint for function                                                    ADL either performed or assessed with clinical judgement   ADL CNA using bedpan with pt.  Educated on need to refrain from using RUE to pull on bed rails                                               Vision         Perception     Praxis      Pertinent Vitals/Pain Pain Assessment: 0-10 Pain Score: 5  Pain Location: back; RUE Pain Descriptors / Indicators: Aching;Sore Pain Intervention(s): Limited activity within patient's tolerance;Repositioned;Ice applied     Hand Dominance     Extremity/Trunk Assessment             Communication     Cognition Arousal/Alertness: Awake/alert Behavior During Therapy: WFL for tasks assessed/performed Overall Cognitive Status: Within Functional Limits for tasks assessed                                     General Comments       Exercises Exercises: Other exercises Other Exercises Other Exercises: RUE A/AAROM FF x 15 reps (education on proper positioning/ice RUE for edema control) Other Exercises: R elbow flex/ext x 20 reps Other Exercises: contract/relax elbow flexion to increase elbow flex ROM  Other Exercises: supination/pronation Other Exercises: Hand ROM - composite flexion/ext4ension/ad/abd ; thumb ROM x 15 reps   Shoulder Instructions      Home Living                                          Prior Functioning/Environment                                 OT Goals(Current goals can be found in the care plan section) Acute Rehab OT Goals Patient Stated Goal: to get better OT Goal Formulation: With patient Time For Goal Achievement: 01/07/17 Potential to Achieve Goals: Good ADL Goals Pt Will Perform Grooming: with min guard assist;sitting Pt Will Transfer to Toilet: with min assist;ambulating;bedside commode Pt Will Perform Toileting - Clothing Manipulation and hygiene: with min assist;sit to/from stand Pt/caregiver will Perform Home Exercise Program: Increased strength;Right Upper extremity;With written HEP provided;Independently Additional ADL  Goal #1: pt/family will demonstrate understanding of donning/doffing radial n palsy splint and verbalize use of splint Additional ADL Goal #2: Pt/family will verbalize understanding of ROM protocol for RUE per MD orders  OT Frequency: Min 3X/week   Barriers to D/C:            Co-evaluation              AM-PAC PT "6 Clicks" Daily Activity     Outcome Measure Help from another person eating meals?: A Little Help from another person taking care of personal grooming?: A Lot Help from another person toileting, which includes using toliet, bedpan, or urinal?: A Lot Help from another person bathing (including washing, rinsing, drying)?: A Lot Help from another person to put on and taking off regular upper body clothing?: A Lot Help from another person to put on and taking off regular lower body clothing?: A Lot 6 Click Score: 13   End of Session Equipment Utilized During Treatment: Oxygen (2L) Nurse Communication: Other (comment) (splint care)  Activity Tolerance: Patient tolerated treatment well Patient left: in bed;with call bell/phone within reach;with family/visitor present  OT Visit Diagnosis: Unsteadiness on feet (R26.81);Other abnormalities of gait and mobility (R26.89);History of falling (Z91.81);Muscle weakness (generalized) (M62.81);Pain Pain - Right/Left: Right Pain - part of body: Arm                Time: 5449-2010 OT Time Calculation (min): 25 min Charges:  OT General Charges $OT Visit:  (2 visits) OT Treatments $Therapeutic Activity: 23-37 mins $Therapeutic Exercise: 8-22 mins $Orthotics Fit/Training: 53-67 mins $ Splint materials complex: 1 Supply G-Codes:     Tere Mcconaughey, OT/L  [phone] 12/27/2016  Johathan Province,HILLARY 12/27/2016, 12:11 PM

## 2016-12-27 NOTE — Social Work (Signed)
CSW met with family and patient at bedside. Patient selected [facility]. CSW spoke with admission at facility and they are initiating insurance auth.

## 2016-12-27 NOTE — Discharge Summary (Addendum)
Physician Discharge Summary  Taylor Brown MRN: [id number] DOB/AGE: Nov 28, 1950 66 y.o.  PCP: Patient, [name]   Admit date: 12/19/2016 Discharge date: 12/27/2016  Discharge Diagnoses:    Principal Problem:   Right supracondylar humerus fracture Active Problems:   Alcohol dependence (HCC)   GI bleed   Acute encephalopathy   Alcohol abuse   Essential hypertension   Hypokalemia   Nicotine dependence   Acute radial nerve palsy of right upper extremity   Liver lesion   Left breast mass   Addendum Discharge canceled due to unavailability of SNF beds   Follow-up recommendations Follow-up with PCP in 3-5 days , including all  additional recommended appointments as below Follow-up CBC, CMP in 3-5 days Patient is follow-up with oncology Taylor Napoleon, MD, once biopsy results are available   Please see orthopedic recommendations for PT and OT recommendations Plan of care discussed extensively with the patient's son and daughter prior to discharge    Current Discharge Medication List    START taking these medications   Details  bisacodyl (DULCOLAX) 10 MG suppository Place 1 suppository (10 mg total) rectally daily as needed for moderate constipation. Qty: 12 suppository, Refills: 0    exemestane (AROMASIN) 25 MG tablet Take 1 tablet (25 mg total) by mouth daily after breakfast. Qty: 30 tablet, Refills: 0   Associated Diagnoses: Metastatic breast cancer (HCC)    feeding supplement (BOOST / RESOURCE BREEZE) LIQD Take 1 Container by mouth 2 (two) times daily between meals. Qty: 30 Container, Refills: 0    folic acid (FOLVITE) 1 MG tablet Take 1 tablet (1 mg total) by mouth daily. Qty: 30 tablet, Refills: 1    methocarbamol (ROBAXIN) 500 MG tablet Take 1 tablet (500 mg total) by mouth every 6 (six) hours as needed for muscle spasms. Qty: 30 tablet, Refills: 1    polyethylene glycol (MIRALAX / GLYCOLAX) packet Take 17 g by mouth daily. Qty: 14 each, Refills: 0     potassium chloride (KLOR-CON) 20 MEQ packet Take 20 mEq by mouth daily. Qty: 30 tablet, Refills: 0    thiamine 100 MG tablet Take 1 tablet (100 mg total) by mouth daily. Qty: 30 tablet, Refills: 1    vitamin B-12 1000 MCG tablet Take 1 tablet (1,000 mcg total) by mouth daily. Qty: 30 tablet, Refills: 1      CONTINUE these medications which have CHANGED   Details  oxyCODONE (OXY IR/ROXICODONE) 5 MG immediate release tablet Take 1 tablet (5 mg total) by mouth every 6 (six) hours as needed. Qty: 10 tablet, Refills: 0      CONTINUE these medications which have NOT CHANGED   Details  amLODipine (NORVASC) 5 MG tablet Take 1 tablet (5 mg total) by mouth daily. Qty: 30 tablet, Refills: 0    docusate sodium 100 MG CAPS Take 100 mg by mouth 2 (two) times daily. Qty: 30 capsule, Refills: 0    esomeprazole (NEXIUM) 20 MG capsule Take 20 mg by mouth as needed.    loratadine (CLARITIN) 10 MG tablet Take 10 mg by mouth daily as needed. For allergies    metoprolol tartrate (LOPRESSOR) 25 MG tablet Take 1 tablet (25 mg total) by mouth 2 (two) times daily. Qty: 60 tablet, Refills: 0    Multiple Vitamin (MULTIVITAMIN WITH MINERALS) TABS Take 1 tablet by mouth daily. Qty: 30 tablet, Refills: 0    pantoprazole (PROTONIX) 40 MG tablet Take 1 tablet (40 mg total) by mouth 2 (two) times daily before  a meal. Qty: 60 tablet, Refills: 1    senna (SENOKOT) 8.6 MG TABS Take 1 tablet (8.6 mg total) by mouth 2 (two) times daily. Qty: 60 tablet, Refills: 0          Discharge Condition: Stable   Discharge Instructions Get Medicines reviewed and adjusted: Please take all your medications with you for your next visit with your Primary MD  Please request your Primary MD to go over all hospital tests and procedure/radiological results at the follow up, please ask your Primary MD to get all Hospital records sent to his/her office.  If you experience worsening of your admission symptoms, develop  shortness of breath, life threatening emergency, suicidal or homicidal thoughts you must seek medical attention immediately by calling 911 or calling your MD immediately if symptoms less severe.  You must read complete instructions/literature along with all the possible adverse reactions/side effects for all the Medicines you take and that have been prescribed to you. Take any new Medicines after you have completely understood and accpet all the possible adverse reactions/side effects.   Do not drive when taking Pain medications.   Do not take more than prescribed Pain, Sleep and Anxiety Medications  Special Instructions: If you have smoked or chewed Tobacco in the last 2 yrs please stop smoking, stop any regular Alcohol and or any Recreational drug use.  Wear Seat belts while driving.  Please note  You were cared for by a hospitalist during your hospital stay. Once you are discharged, your primary care physician will handle any further medical issues. Please note that NO REFILLS for any discharge medications will be authorized once you are discharged, as it is imperative that you return to your primary care physician (or establish a relationship with a primary care physician if you do not have one) for your aftercare needs so that they can reassess your need for medications and monitor your lab values.     Allergies  Allergen Reactions  . Ativan [Lorazepam] Other (See Comments)    Makes confused  . No Known Allergies       Disposition: SNF   Consults:  Oncology Orthopedics     Significant Diagnostic Studies:  Dg Forearm Right  Result Date: 12/20/2016 CLINICAL DATA:  Right arm fracture EXAM: RIGHT FOREARM - 2 VIEW COMPARISON:  None. FINDINGS: No fracture or dislocation is seen in the forearm. Distal humeral fracture may be visible on one of the two views, but is better demonstrated on the prior study. Overlying cast obscures fine osseous detail. IMPRESSION: No fracture or  dislocation is seen in the forearm. Known distal humerus fracture is not well visualized. Electronically Signed   By: Julian Hy M.D.   On: 12/20/2016 11:19   Ct Chest W Contrast  Result Date: 12/24/2016 CLINICAL DATA:  Inpatient admitted with pathologic right humerus fracture and left breast mass. Suspected metastatic breast cancer. Staging. EXAM: CT CHEST, ABDOMEN, AND PELVIS WITH CONTRAST TECHNIQUE: Multidetector CT imaging of the chest, abdomen and pelvis was performed following the standard protocol during bolus administration of intravenous contrast. CONTRAST:  131m ISOVUE-300 IOPAMIDOL (ISOVUE-300) INJECTION 61% COMPARISON:  12/17/2016 chest CT. FINDINGS: CT CHEST FINDINGS Cardiovascular: Top-normal heart size. No significant pericardial fluid/thickening. Atherosclerotic nonaneurysmal thoracic aorta. Normal caliber pulmonary arteries. No central pulmonary emboli. Mediastinum/Nodes: No discrete thyroid nodules. Unremarkable esophagus. No pathologically enlarged axillary, mediastinal or hilar lymph nodes. Lungs/Pleura: No pneumothorax. No pleural effusion. Right middle lobe 5 mm (series 4/image 81) and 4 mm lingular (series 4/image  68) solid pulmonary nodules, both stable since 12/17/2016. Subsegmental atelectasis in the dependent lungs bilaterally. Otherwise no acute consolidative airspace disease, lung masses or additional significant pulmonary nodules. Musculoskeletal: Expansile lytic destructive bone lesion at the right third costovertebral junction (series 4/image 20) with mass-effect on the right thoracic spinal canal. Healed deformities of the lateral left seventh and eighth ribs. Partially visualized surgical fixation of the right distal humerus. Marked thoracic spondylosis . Irregular solid 5.3 x 2.8 cm outer left breast mass, extending to the skin (series 3/image 30). CT ABDOMEN PELVIS FINDINGS Hepatobiliary: Diffuse hepatic steatosis. The known liver masses (at least 4) in the liver seen  on the 12/17/2016 chest CT study are poorly visualized on the routine phase sequence through the liver on today's scan, probably due to differences in contrast timing. Representative liver masses include a 1.7 x 1.5 cm liver dome lesion (series 3/ image 43), a 1.5 x 1.1 cm segment 4B left liver lobe lesion (series 8/ image 7) and a 0.9 x 0.7 cm segment 3 left liver lobe lesion (series 8/image 8). Normal gallbladder with no radiopaque cholelithiasis. No biliary ductal dilatation. Pancreas: Normal, with no mass or duct dilation. Spleen: Normal size. No mass. Adrenals/Urinary Tract: Suggestion of an indeterminate 1.2 cm lower left adrenal nodule (series 3/ image 54) with density 875 HU. No additional discrete adrenal nodules. No hydronephrosis. Mild segmental renal cortical scarring in the upper right kidney. No renal mass. Bladder collapsed by indwelling Foley catheter, limiting bladder evaluation. Grossly normal bladder. Stomach/Bowel: Grossly normal stomach. Normal caliber small bowel with no small bowel wall thickening. Appendectomy. Normal large bowel with no diverticulosis, large bowel wall thickening or pericolonic fat stranding. Vascular/Lymphatic: Atherosclerotic nonaneurysmal abdominal aorta. Patent portal, splenic, hepatic and renal veins. No pathologically enlarged lymph nodes in the abdomen or pelvis. Reproductive: Suggestion of abnormal endometrial thickening (approximately 10 mm). No adnexal mass. Other: No pneumoperitoneum, ascites or focal fluid collection. Small fat containing umbilical hernia. Musculoskeletal: No aggressive appearing focal osseous lesions. Mild L3 and L4 superior vertebral compression fractures of indeterminate chronicity. Moderate lumbar spondylosis . IMPRESSION: 1. Irregular solid 5.3 cm upper left breast mass extending to the skin, suspicious for primary left breast malignancy. Recommend correlation with diagnostic mammographic evaluation. 2. Expansile lytic destructive bone  lesion at the right third costovertebral junction with mass-effect on the right thoracic spinal canal, compatible with osseous metastasis. Consider correlation with thoracic spine MRI without and with IV contrast to evaluate for possible thoracic cord compression, as clinically warranted . 3. Two subcentimeter pulmonary nodules, indeterminate for metastases. Recommend attention on follow-up chest CT in 3 months. 4. Four scattered small liver masses, suspicious for liver metastases. These could be further evaluated with MRI abdomen without and with IV contrast versus PET-CT, as clinically warranted . 5. Possible small left adrenal metastasis. 6. Suggestion of abnormal endometrial thickening (10 mm). Recommend correlation would transabdominal and transvaginal pelvic ultrasound. 7. Mild L3 and L4 vertebral compression fractures of indeterminate chronicity. 8. Additional chronic findings as above. Electronically Signed   By: Ilona Sorrel M.D.   On: 12/24/2016 18:07   Ct Angio Chest Pe W Or Wo Contrast  Result Date: 12/17/2016 CLINICAL DATA:  Shortness of Breath EXAM: CT ANGIOGRAPHY CHEST WITH CONTRAST TECHNIQUE: Multidetector CT imaging of the chest was performed using the standard protocol during bolus administration of intravenous contrast. Multiplanar CT image reconstructions and MIPs were obtained to evaluate the vascular anatomy. CONTRAST:  75 mL Isovue 370 nonionic COMPARISON:  Chest radiograph December 17, 2016 FINDINGS: Cardiovascular: There is no demonstrable pulmonary embolus. There is no thoracic aortic aneurysm or dissection. Main pulmonary outflow tract measures 3.1 cm in length. There is mild calcification at the origin of the left subclavian artery. Other visualized great vessels appear unremarkable. Pericardium is not appreciably thickened. There is left ventricular hypertrophy. Mediastinum/Nodes: Visualized thyroid appears unremarkable. There is no appreciable thoracic adenopathy. Lungs/Pleura: There is  mild bibasilar atelectasis. There is no parenchymal lung edema or consolidation. No pleural effusion or pleural thickening evident. Upper Abdomen: There is diffuse hepatic steatosis. There is an enhancing focus in the anterior segment of the right lobe of the liver measuring 1.7 x 1.7 cm. A similar appearing lesion is noted near the dome of the liver in the medial segment left lobe measuring 1.5 x 1.3 cm. No other focal liver lesions are apparent ; note that liver is incompletely visualized. Visualized upper abdominal structures otherwise appear unremarkable. Musculoskeletal: There is degenerative change in the thoracic spine. There are no blastic or lytic bone lesions. There are several healed rib fractures on the left. Review of the MIP images confirms the above findings. IMPRESSION: No demonstrable pulmonary embolus. Prominence of the main pulmonary outflow tract suggests a degree of pulmonary arterial hypertension. No adenopathy. No lung edema or consolidation.  Bibasilar atelectasis noted. Enhancing lesions in the liver near the dome noted. Etiology uncertain. This finding warrants nonemergent pre and serial post-contrast CT or MR of the liver to further evaluate. Old healed rib fractures on the left. Left ventricular hypertrophy. Electronically Signed   By: Lowella Grip III M.D.   On: 12/17/2016 13:51   Mr Cervical Spine W Wo Contrast  Result Date: 12/25/2016 CLINICAL DATA:  Metastatic disease.  Breast mass. EXAM: MRI TOTAL SPINE WITHOUT AND WITH CONTRAST TECHNIQUE: Multisequence MR imaging of the spine from the cervical spine to the sacrum was performed prior to and following IV contrast administration for evaluation of spinal metastatic disease. CONTRAST:  81m MULTIHANCE GADOBENATE DIMEGLUMINE 529 MG/ML IV SOLN COMPARISON:  CT chest abdomen pelvis 12/24/2016 FINDINGS: MRI CERVICAL SPINE FINDINGS Image quality degraded by motion. Alignment: Normal Vertebrae: No fracture identified. Enhancing lesion  C7 vertebral body compatible with metastatic disease. No epidural extension. T3 metastatic disease also noted. Cord: Negative for cord compression.  Cord signal normal. Posterior Fossa, vertebral arteries, paraspinal tissues: Negative Disc levels: Mild disc degeneration at C4-5 and C5-6 and C6-7. Mild facet degeneration on the left at C4-5. Left foraminal narrowing C5-6 due to disc protrusion and spurring. MRI THORACIC SPINE FINDINGS Alignment:  Normal Vertebrae: Metastatic disease throughout the T3 vertebral body extending into the pedicle and posterior elements. There is also tumor in the right foramen at T2-3 and T3-4 which could cause radicular symptoms. Slight epidural tumor without cord compression. Tumor also present in the spinous processes of T1, T2, T3, T8. Small tumor deposit T5 vertebral body and right superior articulating facet. Tumor in the right sixth rib. Small enhancing lesions T12 vertebral body compatible with metastatic disease. Accentuated dorsal kyphosis.  No pathologic fracture Cord:  Negative for cord compression.  Spinal cord signal normal. Paraspinal and other soft tissues: Negative Disc levels: Multilevel thoracic disc degeneration without significant spinal stenosis. MRI LUMBAR SPINE FINDINGS Segmentation:  Normal Alignment:  Normal Vertebrae: Enhancing metastatic disease is seen in the lumbar spine. Enhancing lesion on the right at L2. Diffuse vertebral body enhancement of L3 with pathologic fracture. Enhancing tumor extends in the pedicle and posterior elements on the right without foraminal encroachment. Small  enhancing lesion S2 on the left. Mild fracture of L4 appears chronic and benign. Conus medullaris: Extends to the L1-2 level and appears normal. Paraspinal and other soft tissues: Diffuse paraspinous muscle atrophy. No retroperitoneal adenopathy Disc levels: L1-2:  Negative L2-3:  Disc and facet degeneration with moderate spinal stenosis L3-4:  Disc and facet degeneration with  severe spinal stenosis L4-5:  Disc and facet degeneration with moderate spinal stenosis L5-S1:  Severe facet degeneration without stenosis. IMPRESSION: Metastatic disease C7 vertebral body without epidural tumor or fracture Multiple metastatic deposits in the thoracic and lumbar spine. Prominent tumor is present on the right at T3 extending into the soft tissues as well as the foramina on the right at T2-3 and T3-4. See above detailed Advanced lumbar degenerative changes with spinal stenosis at L2-3, L3-4, L4-5 Electronically Signed   By: Franchot Gallo M.D.   On: 12/25/2016 11:51   Mr Thoracic Spine W Wo Contrast  Result Date: 12/25/2016 CLINICAL DATA:  Metastatic disease.  Breast mass. EXAM: MRI TOTAL SPINE WITHOUT AND WITH CONTRAST TECHNIQUE: Multisequence MR imaging of the spine from the cervical spine to the sacrum was performed prior to and following IV contrast administration for evaluation of spinal metastatic disease. CONTRAST:  28m MULTIHANCE GADOBENATE DIMEGLUMINE 529 MG/ML IV SOLN COMPARISON:  CT chest abdomen pelvis 12/24/2016 FINDINGS: MRI CERVICAL SPINE FINDINGS Image quality degraded by motion. Alignment: Normal Vertebrae: No fracture identified. Enhancing lesion C7 vertebral body compatible with metastatic disease. No epidural extension. T3 metastatic disease also noted. Cord: Negative for cord compression.  Cord signal normal. Posterior Fossa, vertebral arteries, paraspinal tissues: Negative Disc levels: Mild disc degeneration at C4-5 and C5-6 and C6-7. Mild facet degeneration on the left at C4-5. Left foraminal narrowing C5-6 due to disc protrusion and spurring. MRI THORACIC SPINE FINDINGS Alignment:  Normal Vertebrae: Metastatic disease throughout the T3 vertebral body extending into the pedicle and posterior elements. There is also tumor in the right foramen at T2-3 and T3-4 which could cause radicular symptoms. Slight epidural tumor without cord compression. Tumor also present in the  spinous processes of T1, T2, T3, T8. Small tumor deposit T5 vertebral body and right superior articulating facet. Tumor in the right sixth rib. Small enhancing lesions T12 vertebral body compatible with metastatic disease. Accentuated dorsal kyphosis.  No pathologic fracture Cord:  Negative for cord compression.  Spinal cord signal normal. Paraspinal and other soft tissues: Negative Disc levels: Multilevel thoracic disc degeneration without significant spinal stenosis. MRI LUMBAR SPINE FINDINGS Segmentation:  Normal Alignment:  Normal Vertebrae: Enhancing metastatic disease is seen in the lumbar spine. Enhancing lesion on the right at L2. Diffuse vertebral body enhancement of L3 with pathologic fracture. Enhancing tumor extends in the pedicle and posterior elements on the right without foraminal encroachment. Small enhancing lesion S2 on the left. Mild fracture of L4 appears chronic and benign. Conus medullaris: Extends to the L1-2 level and appears normal. Paraspinal and other soft tissues: Diffuse paraspinous muscle atrophy. No retroperitoneal adenopathy Disc levels: L1-2:  Negative L2-3:  Disc and facet degeneration with moderate spinal stenosis L3-4:  Disc and facet degeneration with severe spinal stenosis L4-5:  Disc and facet degeneration with moderate spinal stenosis L5-S1:  Severe facet degeneration without stenosis. IMPRESSION: Metastatic disease C7 vertebral body without epidural tumor or fracture Multiple metastatic deposits in the thoracic and lumbar spine. Prominent tumor is present on the right at T3 extending into the soft tissues as well as the foramina on the right at T2-3 and T3-4.  See above detailed Advanced lumbar degenerative changes with spinal stenosis at L2-3, L3-4, L4-5 Electronically Signed   By: Franchot Gallo M.D.   On: 12/25/2016 11:51   Mr Lumbar Spine W Wo Contrast  Result Date: 12/25/2016 CLINICAL DATA:  Metastatic disease.  Breast mass. EXAM: MRI TOTAL SPINE WITHOUT AND WITH  CONTRAST TECHNIQUE: Multisequence MR imaging of the spine from the cervical spine to the sacrum was performed prior to and following IV contrast administration for evaluation of spinal metastatic disease. CONTRAST:  43m MULTIHANCE GADOBENATE DIMEGLUMINE 529 MG/ML IV SOLN COMPARISON:  CT chest abdomen pelvis 12/24/2016 FINDINGS: MRI CERVICAL SPINE FINDINGS Image quality degraded by motion. Alignment: Normal Vertebrae: No fracture identified. Enhancing lesion C7 vertebral body compatible with metastatic disease. No epidural extension. T3 metastatic disease also noted. Cord: Negative for cord compression.  Cord signal normal. Posterior Fossa, vertebral arteries, paraspinal tissues: Negative Disc levels: Mild disc degeneration at C4-5 and C5-6 and C6-7. Mild facet degeneration on the left at C4-5. Left foraminal narrowing C5-6 due to disc protrusion and spurring. MRI THORACIC SPINE FINDINGS Alignment:  Normal Vertebrae: Metastatic disease throughout the T3 vertebral body extending into the pedicle and posterior elements. There is also tumor in the right foramen at T2-3 and T3-4 which could cause radicular symptoms. Slight epidural tumor without cord compression. Tumor also present in the spinous processes of T1, T2, T3, T8. Small tumor deposit T5 vertebral body and right superior articulating facet. Tumor in the right sixth rib. Small enhancing lesions T12 vertebral body compatible with metastatic disease. Accentuated dorsal kyphosis.  No pathologic fracture Cord:  Negative for cord compression.  Spinal cord signal normal. Paraspinal and other soft tissues: Negative Disc levels: Multilevel thoracic disc degeneration without significant spinal stenosis. MRI LUMBAR SPINE FINDINGS Segmentation:  Normal Alignment:  Normal Vertebrae: Enhancing metastatic disease is seen in the lumbar spine. Enhancing lesion on the right at L2. Diffuse vertebral body enhancement of L3 with pathologic fracture. Enhancing tumor extends in the  pedicle and posterior elements on the right without foraminal encroachment. Small enhancing lesion S2 on the left. Mild fracture of L4 appears chronic and benign. Conus medullaris: Extends to the L1-2 level and appears normal. Paraspinal and other soft tissues: Diffuse paraspinous muscle atrophy. No retroperitoneal adenopathy Disc levels: L1-2:  Negative L2-3:  Disc and facet degeneration with moderate spinal stenosis L3-4:  Disc and facet degeneration with severe spinal stenosis L4-5:  Disc and facet degeneration with moderate spinal stenosis L5-S1:  Severe facet degeneration without stenosis. IMPRESSION: Metastatic disease C7 vertebral body without epidural tumor or fracture Multiple metastatic deposits in the thoracic and lumbar spine. Prominent tumor is present on the right at T3 extending into the soft tissues as well as the foramina on the right at T2-3 and T3-4. See above detailed Advanced lumbar degenerative changes with spinal stenosis at L2-3, L3-4, L4-5 Electronically Signed   By: CFranchot GalloM.D.   On: 12/25/2016 11:51   Nm Bone Scan Whole Body  Result Date: 12/26/2016 CLINICAL DATA:  Breast cancer. EXAM: NUCLEAR MEDICINE WHOLE BODY BONE SCAN TECHNIQUE: Whole body anterior and posterior images were obtained approximately 3 hours after intravenous injection of radiopharmaceutical. RADIOPHARMACEUTICALS:  25 MCi [id number] IV COMPARISON:  Bone survey 12/23/2016. FINDINGS: Bilateral renal function excretion. Increased focal activity noted over the occiput. Multiple areas of increased activity noted over the spine and ribs. Focal area of increased activity noted over the distal left humerus. These findings are consistent with metastatic disease. IMPRESSION: Findings consistent  with metastatic disease, with involvement of the skull, spine, ribs, and left humerus. Electronically Signed   By: Marcello Moores  Register   On: 12/26/2016 16:31   Ct Abdomen Pelvis W Contrast  Result Date:  12/24/2016 CLINICAL DATA:  Inpatient admitted with pathologic right humerus fracture and left breast mass. Suspected metastatic breast cancer. Staging. EXAM: CT CHEST, ABDOMEN, AND PELVIS WITH CONTRAST TECHNIQUE: Multidetector CT imaging of the chest, abdomen and pelvis was performed following the standard protocol during bolus administration of intravenous contrast. CONTRAST:  133m ISOVUE-300 IOPAMIDOL (ISOVUE-300) INJECTION 61% COMPARISON:  12/17/2016 chest CT. FINDINGS: CT CHEST FINDINGS Cardiovascular: Top-normal heart size. No significant pericardial fluid/thickening. Atherosclerotic nonaneurysmal thoracic aorta. Normal caliber pulmonary arteries. No central pulmonary emboli. Mediastinum/Nodes: No discrete thyroid nodules. Unremarkable esophagus. No pathologically enlarged axillary, mediastinal or hilar lymph nodes. Lungs/Pleura: No pneumothorax. No pleural effusion. Right middle lobe 5 mm (series 4/image 81) and 4 mm lingular (series 4/image 68) solid pulmonary nodules, both stable since 12/17/2016. Subsegmental atelectasis in the dependent lungs bilaterally. Otherwise no acute consolidative airspace disease, lung masses or additional significant pulmonary nodules. Musculoskeletal: Expansile lytic destructive bone lesion at the right third costovertebral junction (series 4/image 20) with mass-effect on the right thoracic spinal canal. Healed deformities of the lateral left seventh and eighth ribs. Partially visualized surgical fixation of the right distal humerus. Marked thoracic spondylosis . Irregular solid 5.3 x 2.8 cm outer left breast mass, extending to the skin (series 3/image 30). CT ABDOMEN PELVIS FINDINGS Hepatobiliary: Diffuse hepatic steatosis. The known liver masses (at least 4) in the liver seen on the 12/17/2016 chest CT study are poorly visualized on the routine phase sequence through the liver on today's scan, probably due to differences in contrast timing. Representative liver masses include  a 1.7 x 1.5 cm liver dome lesion (series 3/ image 43), a 1.5 x 1.1 cm segment 4B left liver lobe lesion (series 8/ image 7) and a 0.9 x 0.7 cm segment 3 left liver lobe lesion (series 8/image 8). Normal gallbladder with no radiopaque cholelithiasis. No biliary ductal dilatation. Pancreas: Normal, with no mass or duct dilation. Spleen: Normal size. No mass. Adrenals/Urinary Tract: Suggestion of an indeterminate 1.2 cm lower left adrenal nodule (series 3/ image 54) with density 875 HU. No additional discrete adrenal nodules. No hydronephrosis. Mild segmental renal cortical scarring in the upper right kidney. No renal mass. Bladder collapsed by indwelling Foley catheter, limiting bladder evaluation. Grossly normal bladder. Stomach/Bowel: Grossly normal stomach. Normal caliber small bowel with no small bowel wall thickening. Appendectomy. Normal large bowel with no diverticulosis, large bowel wall thickening or pericolonic fat stranding. Vascular/Lymphatic: Atherosclerotic nonaneurysmal abdominal aorta. Patent portal, splenic, hepatic and renal veins. No pathologically enlarged lymph nodes in the abdomen or pelvis. Reproductive: Suggestion of abnormal endometrial thickening (approximately 10 mm). No adnexal mass. Other: No pneumoperitoneum, ascites or focal fluid collection. Small fat containing umbilical hernia. Musculoskeletal: No aggressive appearing focal osseous lesions. Mild L3 and L4 superior vertebral compression fractures of indeterminate chronicity. Moderate lumbar spondylosis . IMPRESSION: 1. Irregular solid 5.3 cm upper left breast mass extending to the skin, suspicious for primary left breast malignancy. Recommend correlation with diagnostic mammographic evaluation. 2. Expansile lytic destructive bone lesion at the right third costovertebral junction with mass-effect on the right thoracic spinal canal, compatible with osseous metastasis. Consider correlation with thoracic spine MRI without and with IV  contrast to evaluate for possible thoracic cord compression, as clinically warranted . 3. Two subcentimeter pulmonary nodules, indeterminate for metastases. Recommend attention  on follow-up chest CT in 3 months. 4. Four scattered small liver masses, suspicious for liver metastases. These could be further evaluated with MRI abdomen without and with IV contrast versus PET-CT, as clinically warranted . 5. Possible small left adrenal metastasis. 6. Suggestion of abnormal endometrial thickening (10 mm). Recommend correlation would transabdominal and transvaginal pelvic ultrasound. 7. Mild L3 and L4 vertebral compression fractures of indeterminate chronicity. 8. Additional chronic findings as above. Electronically Signed   By: Ilona Sorrel M.D.   On: 12/24/2016 18:07   [place] Core Biopsy  Result Date: 12/26/2016 CLINICAL DATA:  66 year old who presented this admission with a pathological fracture involving the distal right humerus for which she will underwent ORIF. CT examination 12/24/2016 demonstrated a 5.3 cm mass involving the upper outer quadrant of the left breast. Biopsy is requested. EXAM: ULTRASOUND GUIDED LEFT BREAST CORE NEEDLE BIOPSY COMPARISON:  No prior breast imaging. CT chest 12/24/2016 is correlated. FINDINGS: I met with the patient and we discussed the procedure of ultrasound-guided biopsy, including benefits and alternatives. We discussed the high likelihood of a successful procedure. We discussed the risks of the procedure, including infection, bleeding, tissue injury, clip migration, and inadequate sampling. Informed written consent was given. The usual time-out protocol was performed immediately prior to the procedure. Initial ultrasound imaging shows a large hypoechoic mass with irregular margins at the 1 o'clock position approximately 6 cm from the nipple, associated with microcalcifications and associated with skin involvement. In fact, the overlying skin is ulcerated. Due to its large size, an  accurate ultrasound measurement was unobtainable, though the 5.3 cm size from CT appears accurate. Sonographic evaluation of the left axilla demonstrates no pathologic lymphadenopathy. Lesion quadrant: Upper outer quadrant. Using sterile technique with chlorhexidine as skin antisepsis, 1% Lidocaine as local anesthetic, under direct ultrasound visualization, a 16 gauge spring-loaded core needle device was used to perform biopsy of the mass involving the upper outer quadrant of the left breast using a lateral approach. Six core samples were obtained and each was immediately placed in formalin. IMPRESSION: 1. Ultrasound guided biopsy of a large mass involving the upper outer quadrant of the left breast. No apparent complications. 2. The skin overlying the mass in the left upper quadrant is ulcerated, and sonographic evaluation confirms skin involvement. 3. No pathologic left axillary lymphadenopathy. Electronically Signed   By: Evangeline Dakin M.D.   On: 12/26/2016 16:41   Dg Chest Port 1 View  Result Date: 12/23/2016 CLINICAL DATA:  Shortness of breath EXAM: PORTABLE CHEST 1 VIEW COMPARISON:  12/17/2016 FINDINGS: Linear atelectasis at the medial left base. Cannot exclude small left pleural effusion. Mild cardiomegaly with atherosclerosis. No overt pulmonary edema. No pneumothorax. Old left rib fracture. IMPRESSION: 1. Cardiomegaly without edema or infiltrate 2. Subsegmental atelectasis at the left lung base. Electronically Signed   By: Donavan Foil M.D.   On: 12/23/2016 19:08   Dg Chest Portable 1 View  Result Date: 12/17/2016 CLINICAL DATA:  Shortness of breath and tachycardia EXAM: PORTABLE CHEST 1 VIEW COMPARISON:  05/13/2012 FINDINGS: Cardiomegaly noted. There is no evidence of focal airspace disease, pulmonary edema, suspicious pulmonary nodule/mass, pleural effusion, or pneumothorax. No acute bony abnormalities are identified. IMPRESSION: Cardiomegaly without evidence of acute cardiopulmonary disease.  Electronically Signed   By: Margarette Canada M.D.   On: 12/17/2016 12:11   Dg Bone Survey Met  Result Date: 12/23/2016 CLINICAL DATA:  (Breast lesion. Pathologic fracture of the right humerus. Status post ORIF of the right humerus. Evaluate for metastasis. EXAM:  METASTATIC BONE SURVEY COMPARISON:  CXR and right humerus radiographs from 12/17/2016, left ankle radiographs from 05/11/2012, left wrist radiographs small 05/11/2012 FINDINGS: Lateral skull: Occipital lytic skull lucency measuring 13 x 8 mm. No fracture. Both shoulders: Negative for lytic or blastic disease. No acute fracture nor dislocation. Left old sixth and seventh rib fractures are incidentally included. Both humeri: Pathologic fracture through moth eaten lytic lesion of the distal right humeral diaphysis transfixed by plate and screws. Lesion at the junction of the middle and distal third of the humeral shaft. Left humerus is negative. Radius and ulna: Negative for lytic or blastic disease. Volar plate and screw fixation across distal left radius fracture. Cervical spine AP and lateral: Degenerative disc and facet arthropathy. No suspicious osseous abnormality. Thoracic spine: Multilevel degenerative disc and endplate changes consistent with spondylosis. Lumbar spine: Chronic appearing superior endplate compressions of L1, L3 and L4 without retropulsion. No lytic or blastic disease. Lower lumbar facet arthropathy from L3 through S1. Slight disc space narrowing at L5-S1. PA chest: Normal size heart. Aortic atherosclerosis. Atelectasis at the lung bases. No dominant mass. Elevation of the right hemidiaphragm versus eventration. Old left-sided rib fractures. AP pelvis: Lower lumbar facet arthropathy. Intact SI joints and pubic symphysis with osteoarthritis of the pubic symphysis. Old right inferior pubic ramus fracture. No suspicious lytic or blastic disease of the pelvis and included hips. Both femora, tibia and fibula: Negative for lytic or blastic  disease. ORIF of left bimalleolar fractures. IMPRESSION: 13 x 8 mm lytic focus involving the occiput of the skull which could be a normal variant such is an arachnoid granulation. CT may help for further correlation. No additional lytic or blastic disease are apparent. Pathologic fracture transfixed by plate and screws involving the distal right humeral shaft. ORIF of the distal left radius and left bimalleolar fractures are also noted. Cervical, thoracic and lumbar spondylosis. Chronic benign-appearing endplate compressions of L1, L3 and L4. Electronically Signed   By: Ashley Royalty M.D.   On: 12/23/2016 17:11   Dg Humerus Right  Result Date: 12/23/2016 CLINICAL DATA:  OPEN REDUCTION INTERNAL FIXATION (ORIF) DISTAL HUMERUS FRACTURE (Right Arm Upper). Radiation Safety Timeout performed by BIW. Fluoro time 12 seconds. EXAM: DG C-ARM 61-120 MIN; RIGHT HUMERUS - 2+ VIEW COMPARISON:  12/20/2016 FINDINGS: Patient has undergone screw plate fixation of right humerus fracture. A cortical screw traverses the fracture site. There is near anatomic alignment. Elbow is unremarkable in appearance. IMPRESSION: Status post ORIF of the right humerus. Lucency at the fracture site, raising the question of pathologic fracture. Correlation with history is recommended. Electronically Signed   By: Nolon Nations M.D.   On: 12/23/2016 13:53   Dg Humerus Right  Result Date: 12/17/2016 CLINICAL DATA:  Acute onset pain while lifting heavy object EXAM: RIGHT HUMERUS - 2+ VIEW COMPARISON:  None. FINDINGS: Frontal and lateral views were obtained. There is an obliquely oriented fracture of the distal humerus with medial angulation and anterior displacement of the distal fracture fragment with respect proximal fragment. No other fractures. No dislocations. No abnormal periosteal reaction or appreciable arthropathy. IMPRESSION: Obliquely oriented fracture distal humerus with displacement angulation of fracture fragments. No dislocation. No  abnormal periosteal reaction. No appreciable arthropathic change. Electronically Signed   By: Lowella Grip III M.D.   On: 12/17/2016 11:43   Dg Hand Complete Left  Result Date: 12/20/2016 CLINICAL DATA:  Left hand swelling EXAM: LEFT HAND - COMPLETE 3+ VIEW COMPARISON:  None. FINDINGS: No evidence of acute fracture or  dislocation. Status post ORIF of the distal radius. Moderate degenerative changes at the 1st carpometacarpal joint. The visualized soft tissues are unremarkable. IMPRESSION: No acute osseus abnormality is seen. Status post ORIF of the distal radius. Moderate degenerative changes of the 1st carpometacarpal joint. Electronically Signed   By: Julian Hy M.D.   On: 12/20/2016 11:10   Dg C-arm 1-60 Min  Result Date: 12/23/2016 CLINICAL DATA:  OPEN REDUCTION INTERNAL FIXATION (ORIF) DISTAL HUMERUS FRACTURE (Right Arm Upper). Radiation Safety Timeout performed by BIW. Fluoro time 12 seconds. EXAM: DG C-ARM 61-120 MIN; RIGHT HUMERUS - 2+ VIEW COMPARISON:  12/20/2016 FINDINGS: Patient has undergone screw plate fixation of right humerus fracture. A cortical screw traverses the fracture site. There is near anatomic alignment. Elbow is unremarkable in appearance. IMPRESSION: Status post ORIF of the right humerus. Lucency at the fracture site, raising the question of pathologic fracture. Correlation with history is recommended. Electronically Signed   By: Nolon Nations M.D.   On: 12/23/2016 13:53       There were no vitals filed for this visit.   Microbiology: Recent Results (from the past 240 hour(s))  MRSA PCR Screening     Status: None   Collection Time: 12/19/16  5:29 PM  Result Value Ref Range Status   MRSA by PCR NEGATIVE NEGATIVE Final    Comment:        The GeneXpert MRSA Assay (FDA approved for NASAL specimens only), is one component of a comprehensive MRSA colonization surveillance program. It is not intended to diagnose MRSA infection nor to guide or monitor  treatment for MRSA infections.   Culture, Urine     Status: Abnormal   Collection Time: 12/20/16 11:10 AM  Result Value Ref Range Status   Specimen Description URINE, RANDOM  Final   Special Requests NONE  Final   Culture <10,000 COLONIES/mL INSIGNIFICANT GROWTH (A)  Final   Report Status 12/21/2016 FINAL  Final  Surgical pcr screen     Status: None   Collection Time: 12/22/16  9:09 PM  Result Value Ref Range Status   MRSA, PCR NEGATIVE NEGATIVE Final   Staphylococcus aureus NEGATIVE NEGATIVE Final    Comment:        The Xpert SA Assay (FDA approved for NASAL specimens in patients over 10 years of age), is one component of a comprehensive surveillance program.  Test performance has been validated by [facility] for patients greater than or equal to 67 year old. It is not intended to diagnose infection nor to guide or monitor treatment.        Blood Culture    Component Value Date/Time   SDES URINE, RANDOM 12/20/2016 1110   SPECREQUEST NONE 12/20/2016 1110   CULT <10,000 COLONIES/mL INSIGNIFICANT GROWTH (A) 12/20/2016 1110   REPTSTATUS 12/21/2016 FINAL 12/20/2016 1110      Labs: Results for orders placed or performed during the hospital encounter of 12/19/16 (from the past 48 hour(s))  Urinalysis, Routine w reflex microscopic     Status: Abnormal   Collection Time: 12/25/16 12:12 PM  Result Value Ref Range   Color, Urine YELLOW YELLOW   APPearance HAZY (A) CLEAR   Specific Gravity, Urine 1.029 1.005 - 1.030   pH 7.0 5.0 - 8.0   Glucose, UA NEGATIVE NEGATIVE mg/dL   Hgb urine dipstick NEGATIVE NEGATIVE   Bilirubin Urine NEGATIVE NEGATIVE   Ketones, ur NEGATIVE NEGATIVE mg/dL   Protein, ur NEGATIVE NEGATIVE mg/dL   Nitrite NEGATIVE NEGATIVE   Leukocytes, UA  NEGATIVE NEGATIVE  Protime-INR     Status: None   Collection Time: 12/26/16  8:48 AM  Result Value Ref Range   Prothrombin Time 14.1 11.4 - 15.2 seconds   INR 1.09   Magnesium     Status: None    Collection Time: 12/26/16 10:57 AM  Result Value Ref Range   Magnesium 1.9 1.7 - 2.4 mg/dL  CBC     Status: Abnormal   Collection Time: 12/27/16  4:22 AM  Result Value Ref Range   WBC 7.7 4.0 - 10.5 K/uL   RBC 3.48 (L) 3.87 - 5.11 MIL/uL   Hemoglobin 11.8 (L) 12.0 - 15.0 g/dL   HCT 37.3 36.0 - 46.0 %   MCV 107.2 (H) 78.0 - 100.0 fL   MCH 33.9 26.0 - 34.0 pg   MCHC 31.6 30.0 - 36.0 g/dL   RDW 14.6 11.5 - 15.5 %   Platelets 409 (H) 150 - 400 K/uL  Comprehensive metabolic panel     Status: Abnormal   Collection Time: 12/27/16  4:22 AM  Result Value Ref Range   Sodium 137 135 - 145 mmol/L   Potassium 3.3 (L) 3.5 - 5.1 mmol/L   Chloride 99 (L) 101 - 111 mmol/L   CO2 29 22 - 32 mmol/L   Glucose, Bld 105 (H) 65 - 99 mg/dL   BUN 9 6 - 20 mg/dL   Creatinine, Ser 0.55 0.44 - 1.00 mg/dL   Calcium 8.7 (L) 8.9 - 10.3 mg/dL   Total Protein 5.8 (L) 6.5 - 8.1 g/dL   Albumin 2.5 (L) 3.5 - 5.0 g/dL   AST 29 15 - 41 U/L   ALT 19 14 - 54 U/L   Alkaline Phosphatase 100 38 - 126 U/L   Total Bilirubin 0.5 0.3 - 1.2 mg/dL   GFR calc non Af Amer >60 >60 mL/min   GFR calc Af Amer >60 >60 mL/min    Comment: (NOTE) The eGFR has been calculated using the CKD EPI equation. This calculation has not been validated in all clinical situations. eGFR's persistently <60 mL/min signify possible Chronic Kidney Disease.    Anion gap 9 5 - 15     Lipid Panel     Component Value Date/Time   CHOL 186 12/17/2016 1153   TRIG 64 12/17/2016 1153   HDL 94 12/17/2016 1153   CHOLHDL 2.0 12/17/2016 1153   VLDL 13 12/17/2016 1153   LDLCALC 79 12/17/2016 1153     Lab Results  Component Value Date   HGBA1C 5.7 (H) 12/17/2016       HPI :  66 y.o. female with long-standing history of tobacco and alcohol abuse -last seen by a primary M.D. more than 50 years back, transferred from [facility] to the orthopedic service for evaluation of right supracondylar distal humerus fracture with acute radial nerve palsy. Further  evaluation revealed that the distal humerus fracture was pathological, and upon further evaluation-she was found to have a left breast mass with extensive metastatic disease to her spine,and liver.. See below for further details   HOSPITAL COURSE:  Right supracondylar humerus pathological fracture with acute radial palsy: Patient is status post open reduction internal fixation of distal humerus fracture. PT/OT. Splint from OT. Postop care deferred to the orthopedic service. See recommendations from 6/18. Suspected to be a pathological fracture-radiation oncology aware of this patient-and the need for possible palliative radiation.   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 Bone scan positive for metastatic disease. MRI of the entire spine showed significant metastatic disease around the thoracic 3rd vertebra, but numerous other vertebra is also involved.  Marland Kitchen MRI findings was discussed with radiation oncology on call-Dr Ennever since no evidence of cord compression, no immediate recommendations was provided-radiation oncology  consulted 6/18. Dr. Tammi Klippel, reviewed the records and imaging studies. Operating final pathology. Suspect patient would need radiation to right humerus after appropriate healing, T3, L3. Patient will be followed outpatient by radiation oncology.CA 27.29 was normal. Patient started on Aromasin by oncology . Patient will also need follow-up with Dr. Tammi Klippel  Acute toxic metabolic encephalopathy: Resolved, she is completely awake and alert. Etiology of encephalopathy felt to be multifactorial-secondary to alcohol withdrawal and medication effect.   Alcohol withdrawal: No further withdrawal symptoms, last drink on 6/9, should be out of the window for further alcohol withdrawal symptoms. Managed with Ativan per protocol.   Antral gastritis-EtOH an NSAID-induced: Status  post EGD at [facility] for antral gastritis-continue with PPI. No overt evidence of bleeding at this time.  Tobacco abuse:  Nicotine cessation counseling done  Macrocytosis: Folate levels within normal limits-vitamin B-12 level-borderline low 275 . will begin B-12 supplementation  Hypertension: Continue metoprolol/Norvasc    Mildly elevated TSH: Likely subclinical hypothyroidism-no indication for supplementation-stable for outpatient follow-up.    Discharge Exam:   Blood pressure 123/62, pulse 71, temperature 98.7 F (37.1 C), temperature source Oral, resp. rate 16, SpO2 97 %. General appearance :Awake, alert, not in any distress.  Eyes:, pupils equally reactive to light and accomodation,no scleral icterus. HEENT: Atraumatic and Normocephalic Neck: supple, no JVD. Resp:Good air entry bilaterally CVS: S1 S2 regular, no murmurs.  GI: Bowel sounds present, Non tender and not distended with no gaurding, rigidity or rebound. Extremities: B/L Lower Ext shows no edema       Signed: Reyne Brown 12/27/2016, 9:24 AM        Time spent >1 hour

## 2016-12-28 ENCOUNTER — Other Ambulatory Visit: Payer: Self-pay | Admitting: Hematology & Oncology

## 2016-12-28 DIAGNOSIS — J189 Pneumonia, unspecified organism: Secondary | ICD-10-CM | POA: Diagnosis not present

## 2016-12-28 DIAGNOSIS — R278 Other lack of coordination: Secondary | ICD-10-CM | POA: Diagnosis not present

## 2016-12-28 DIAGNOSIS — Z510 Encounter for antineoplastic radiation therapy: Secondary | ICD-10-CM | POA: Diagnosis not present

## 2016-12-28 DIAGNOSIS — S4421XS Injury of radial nerve at upper arm level, right arm, sequela: Secondary | ICD-10-CM | POA: Diagnosis not present

## 2016-12-28 DIAGNOSIS — G9340 Encephalopathy, unspecified: Secondary | ICD-10-CM | POA: Diagnosis not present

## 2016-12-28 DIAGNOSIS — R41841 Cognitive communication deficit: Secondary | ICD-10-CM | POA: Diagnosis not present

## 2016-12-28 DIAGNOSIS — J440 Chronic obstructive pulmonary disease with acute lower respiratory infection: Secondary | ICD-10-CM | POA: Diagnosis present

## 2016-12-28 DIAGNOSIS — Y95 Nosocomial condition: Secondary | ICD-10-CM | POA: Diagnosis present

## 2016-12-28 DIAGNOSIS — C787 Secondary malignant neoplasm of liver and intrahepatic bile duct: Secondary | ICD-10-CM

## 2016-12-28 DIAGNOSIS — F1029 Alcohol dependence with unspecified alcohol-induced disorder: Secondary | ICD-10-CM | POA: Diagnosis not present

## 2016-12-28 DIAGNOSIS — I10 Essential (primary) hypertension: Secondary | ICD-10-CM | POA: Diagnosis present

## 2016-12-28 DIAGNOSIS — C7800 Secondary malignant neoplasm of unspecified lung: Secondary | ICD-10-CM

## 2016-12-28 DIAGNOSIS — C50912 Malignant neoplasm of unspecified site of left female breast: Secondary | ICD-10-CM | POA: Diagnosis not present

## 2016-12-28 DIAGNOSIS — R509 Fever, unspecified: Secondary | ICD-10-CM | POA: Diagnosis not present

## 2016-12-28 DIAGNOSIS — Z79899 Other long term (current) drug therapy: Secondary | ICD-10-CM | POA: Diagnosis not present

## 2016-12-28 DIAGNOSIS — R69 Illness, unspecified: Secondary | ICD-10-CM | POA: Diagnosis not present

## 2016-12-28 DIAGNOSIS — F1010 Alcohol abuse, uncomplicated: Secondary | ICD-10-CM

## 2016-12-28 DIAGNOSIS — Z4789 Encounter for other orthopedic aftercare: Secondary | ICD-10-CM | POA: Diagnosis not present

## 2016-12-28 DIAGNOSIS — M84421A Pathological fracture, right humerus, initial encounter for fracture: Secondary | ICD-10-CM | POA: Diagnosis present

## 2016-12-28 DIAGNOSIS — A419 Sepsis, unspecified organism: Secondary | ICD-10-CM | POA: Diagnosis not present

## 2016-12-28 DIAGNOSIS — R2681 Unsteadiness on feet: Secondary | ICD-10-CM | POA: Diagnosis not present

## 2016-12-28 DIAGNOSIS — F1020 Alcohol dependence, uncomplicated: Secondary | ICD-10-CM | POA: Diagnosis present

## 2016-12-28 DIAGNOSIS — C7951 Secondary malignant neoplasm of bone: Secondary | ICD-10-CM | POA: Diagnosis not present

## 2016-12-28 DIAGNOSIS — M6281 Muscle weakness (generalized): Secondary | ICD-10-CM | POA: Diagnosis not present

## 2016-12-28 DIAGNOSIS — J441 Chronic obstructive pulmonary disease with (acute) exacerbation: Secondary | ICD-10-CM | POA: Diagnosis not present

## 2016-12-28 DIAGNOSIS — R0602 Shortness of breath: Secondary | ICD-10-CM | POA: Diagnosis not present

## 2016-12-28 DIAGNOSIS — G5631 Lesion of radial nerve, right upper limb: Secondary | ICD-10-CM | POA: Diagnosis not present

## 2016-12-28 DIAGNOSIS — C50919 Malignant neoplasm of unspecified site of unspecified female breast: Secondary | ICD-10-CM

## 2016-12-28 DIAGNOSIS — E876 Hypokalemia: Secondary | ICD-10-CM | POA: Diagnosis present

## 2016-12-28 DIAGNOSIS — J9601 Acute respiratory failure with hypoxia: Secondary | ICD-10-CM | POA: Diagnosis not present

## 2016-12-28 DIAGNOSIS — J9600 Acute respiratory failure, unspecified whether with hypoxia or hypercapnia: Secondary | ICD-10-CM | POA: Diagnosis present

## 2016-12-28 DIAGNOSIS — M84421S Pathological fracture, right humerus, sequela: Secondary | ICD-10-CM | POA: Diagnosis not present

## 2016-12-28 DIAGNOSIS — Z792 Long term (current) use of antibiotics: Secondary | ICD-10-CM | POA: Diagnosis not present

## 2016-12-28 DIAGNOSIS — Z7952 Long term (current) use of systemic steroids: Secondary | ICD-10-CM | POA: Diagnosis not present

## 2016-12-28 DIAGNOSIS — Z9181 History of falling: Secondary | ICD-10-CM | POA: Diagnosis not present

## 2016-12-28 DIAGNOSIS — S42411G Displaced simple supracondylar fracture without intercondylar fracture of right humerus, subsequent encounter for fracture with delayed healing: Secondary | ICD-10-CM | POA: Diagnosis not present

## 2016-12-28 DIAGNOSIS — S42411A Displaced simple supracondylar fracture without intercondylar fracture of right humerus, initial encounter for closed fracture: Secondary | ICD-10-CM | POA: Diagnosis not present

## 2016-12-28 DIAGNOSIS — R05 Cough: Secondary | ICD-10-CM | POA: Diagnosis not present

## 2016-12-28 MED ORDER — OXYCODONE HCL 5 MG PO TABS: 5.0000 mg | ORAL_TABLET | Freq: Four times a day (QID) | ORAL | 0 refills | Status: AC | PRN

## 2016-12-28 MED ORDER — POTASSIUM CHLORIDE CRYS ER 20 MEQ PO TBCR
40.0000 meq | EXTENDED_RELEASE_TABLET | Freq: Once | ORAL | Status: AC
  Administered 2016-12-28: 40 meq via ORAL
  Filled 2016-12-28: qty 2

## 2016-12-28 MED ORDER — ABEMACICLIB 100 MG PO TABS: 100.0000 mg | ORAL_TABLET | Freq: Two times a day (BID) | ORAL | 5 refills | Status: DC

## 2016-12-28 NOTE — Care Management Important Message (Signed)
Important Message  Patient Details  Name: Taylor Brown MRN: [id number] Date of Birth: 07-04-1951   Medicare Important Message Given:  Yes    Orbie Pyo 12/28/2016, 11:22 AM

## 2016-12-28 NOTE — Progress Notes (Signed)
Pt's sutures look clean, dry, intact.  Margins evenly approximated. No drainage, redness, or warmth to touch. Arm is elevated on pillow. Pt resting comfortably. Will continue to monitor.

## 2016-12-28 NOTE — Social Work (Signed)
Clinical Social Worker facilitated patient discharge including contacting patient family and facility to confirm patient discharge plans.  Clinical information faxed to facility and family agreeable with plan.    CSW arranged ambulance transport via [facility] to [facility].    RN to call [phone] to give report prior to discharge.   Pt going to [facility] [place].  Clinical Social Worker will sign off for now as social work intervention is no longer needed. Please consult [place] again if new need arises.  Barbette Or, [place]

## 2016-12-28 NOTE — Social Work (Signed)
CSW received confirmation from SNF that they have received insurance auth.  CSW discussed with admissions staff that patient will be followed by oncology in community and will have appointments. Admission staff indicated nurse will review and CSW can send when ready.   CSW spoke with patient and advised of same.  CSW will f/u with transportation to facility.

## 2016-12-28 NOTE — Clinical Social Work Placement (Signed)
   CLINICAL SOCIAL WORK PLACEMENT  NOTE  Date:  12/28/2016  Patient Details  Name: Taylor Brown MRN: [id number] Date of Birth: 11/27/1950  Clinical Social Work is seeking post-discharge placement for this patient at the [place] level of care (*CSW will initial, date and re-position this form in  chart as items are completed):  Yes   Patient/family provided with [place] Work Department's list of facilities offering this level of care within the geographic area requested by the patient (or if unable, by the patient's family).  Yes   Patient/family informed of their freedom to choose among providers that offer the needed level of care, that participate in Medicare, Medicaid or managed care program needed by the patient, have an available bed and are willing to accept the patient.  Yes   Patient/family informed of [facility]'s ownership interest in [facility] and [facility], as well as of the fact that they are under no obligation to receive care at these facilities.  PASRR submitted to EDS on       PASRR number received on 12/26/16     Existing PASRR number confirmed on 12/26/16     FL2 transmitted to all facilities in geographic area requested by pt/family on 12/26/16     FL2 transmitted to all facilities within larger geographic area on 12/26/16     Patient informed that his/her managed care company has contracts with or will negotiate with certain facilities, including the following:        Yes   Patient/family informed of bed offers received.  Patient chooses bed at [facility]     Physician recommends and patient chooses bed at      Patient to be transferred to [facility] on 12/28/16.  Patient to be transferred to facility by [facility]     Patient family notified on 12/28/16 of transfer.  Name of family member notified:  daughter jennifer     PHYSICIAN Please prepare priority discharge summary, including medications, Please  prepare prescriptions, Please sign FL2     Additional Comment:    _______________________________________________ Normajean Baxter, LCSW 12/28/2016, 1:45 PM

## 2016-12-28 NOTE — Progress Notes (Signed)
PT Cancellation Note  Patient Details Name: TYREISHA UNGAR MRN: [id number] DOB: 03-26-1951   Cancelled Treatment:    Reason Eval/Treat Not Completed: (P) Other (comment) (Pt with plans to d/c to [facility] today, waiting on [facility] for transport.  ) Will defer PT needs to rehab.     Adon Gehlhausen Eli Hose 12/28/2016, 2:42 PM  Governor Rooks, PTA pager [phone]

## 2016-12-28 NOTE — Progress Notes (Signed)
Occupational Therapy Treatment Patient Details Name: Taylor Brown MRN: [id number] DOB: Nov 17, 1950 Today's Date: 12/28/2016    History of present illness Pt is a 66 y.o. female presenting with R distal humerus fracture with acute radial nerve injury s/p fall. During hospital admission pt was found to have antral gastritis, liver lesions, and L breast mass with possible metastatic breast cancer. Pt is now s/p ORIF R distal humerus fracture and exploration of radial nerve on 6/15. PMHx: Arthritis, GERD, ORIF L ankle fx, ORIF L wrist fx. Pt currently being worked up for metatstatic breast cancer.    OT comments  Pt seen x 2 today. On entry, pt's RUE in dependent position with increased edema in R hand. Completed RUE exercise per orders. Splint donned, RUE elevated and ice to upper arm. Pt given written information regarding RUE protocol and management of splints for facility/copy placed in chart. Returned on second visit to check splint. No redness noted after modification yesterday. Again reviewed wearing time of splints with pt. Pt to wear dynamic raadial nerve palsy splint in 1-2 hour intervals to help with functional use of hand and work on extension. If not using splint, RUE needs to be elevated on 2 pillows and hand/wrist positioned in extension. Pt can also use wrist cock up splint during the day and needs to sleep in wrist cock up splint. Will continue to follow.   Follow Up Recommendations  SNF;Supervision/Assistance - 24 hour    Equipment Recommendations  Other (comment)    Recommendations for Other Services PT consult    Precautions / Restrictions Precautions Precautions: Fall Precaution Comments: No active abduction R shoulder. Ok for passive abduction R shoulder. [place] for active and passive R shoulder flexion. Unrestricted ROM R elbow, forearm, wrist and hand. 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  Required Braces or Orthoses: Other Brace/Splint Other  Brace/Splint: R pre-fab wrist cock up splint; sling when walking; radial n palsy splint for function Restrictions Weight Bearing Restrictions: Yes RUE Weight Bearing: Non weight bearing              A  Cognition Arousal/Alertness: Awake/alert Behavior During Therapy: WFL for tasks assessed/performed Overall Cognitive Status: Within Functional Limits for tasks assessed        Pt appars to demonstrate STM deficits? If baseline                                  Exercises Exercises: Other exercises Other Exercises Other Exercises: RUE A/AAROM FF x 15 reps Other Exercises: R elbow flex/ext x 20 reps Other Exercises: contract/relax elbow flexion to increase elbow flex ROM; able to achieve @ 100 degrees elbow flex Other Exercises: supination/pronation Other Exercises: Hand ROM - composite flexion/ext4ension/ad/abd ; thumb ROM x 15 reps   Shoulder Instructions       General Comments      Pertinent Vitals/ Pain       Pain Assessment: Faces Faces Pain Scale: Hurts little more Pain Location: back; RUE Pain Descriptors / Indicators: Aching;Sore Pain Intervention(s): Limited activity within patient's tolerance;Repositioned;Ice applied  Home Living                                          Prior Functioning/Environment              Frequency  Min 3X/week        Progress Toward Goals  OT Goals(current goals can now be found in the care plan section)  Progress towards OT goals: Progressing toward goals  Acute Rehab OT Goals Patient Stated Goal: to get better OT Goal Formulation: With patient Time For Goal Achievement: 01/07/17 Potential to Achieve Goals: Good ADL Goals Pt Will Perform Grooming: with min guard assist;sitting Pt Will Transfer to Toilet: with min assist;ambulating;bedside commode Pt Will Perform Toileting - Clothing Manipulation and hygiene: with min assist;sit to/from stand Pt/caregiver will Perform Home Exercise  Program: Increased strength;Right Upper extremity;With written HEP provided;Independently Additional ADL Goal #1: pt/family will demonstrate understanding of donning/doffing radial n palsy splint and verbalize use of splint Additional ADL Goal #2: Pt/family will verbalize understanding of ROM protocol for RUE per MD orders  Plan Discharge plan remains appropriate    Co-evaluation                 AM-PAC PT "6 Clicks" Daily Activity     Outcome Measure   Help from another person eating meals?: A Little Help from another person taking care of personal grooming?: A Lot Help from another person toileting, which includes using toliet, bedpan, or urinal?: A Lot Help from another person bathing (including washing, rinsing, drying)?: A Lot Help from another person to put on and taking off regular upper body clothing?: A Lot Help from another person to put on and taking off regular lower body clothing?: A Lot 6 Click Score: 13    End of Session    OT Visit Diagnosis: Unsteadiness on feet (R26.81);Other abnormalities of gait and mobility (R26.89);History of falling (Z91.81);Muscle weakness (generalized) (M62.81);Pain Pain - Right/Left: Right Pain - part of body: Arm   Activity Tolerance Patient tolerated treatment well   Patient Left in bed;with call bell/phone within reach   Nurse Communication Other (comment) (splint management)        Time: 9937-1696 OT Time Calculation (min): 17 min  2nd visit: 1027 - 1039 (12 min)  Charges: OT General Charges $OT Visit:  (2 visits) OT Treatments $Therapeutic Activity: 8-22 mins $Orthotics Fit/Training: 8-22 mins  [facility], OT/L  [phone] 12/28/2016   Ronda Kazmi,HILLARY 12/28/2016, 10:44 AM

## 2016-12-28 NOTE — Discharge Summary (Signed)
Physician Discharge Summary  NITA WHITMIRE MRN: [id number] DOB/AGE: 67-Apr-1952 67 y.o.  PCP: Patient, [name]   Admit date: 12/19/2016 Discharge date: 12/28/2016  Discharge Diagnoses:    Principal Problem:   Right supracondylar humerus fracture Active Problems:   Alcohol dependence (HCC)   GI bleed   Acute encephalopathy   Alcohol abuse   Essential hypertension   Hypokalemia   Nicotine dependence   Acute radial nerve palsy of right upper extremity   Liver lesion   Left breast mass    Follow-up recommendations Follow-up with PCP in 3-5 days , including all  additional recommended appointments as below Follow-up CBC, CMP in 3-5 days Patient is follow-up with oncology Volanda Napoleon, MD, once biopsy results are available   Please see orthopedic recommendations for PT and OT recommendations Plan of care discussed extensively with the patient's son and daughter prior to discharge    Current Discharge Medication List    START taking these medications   Details  bisacodyl (DULCOLAX) 10 MG suppository Place 1 suppository (10 mg total) rectally daily as needed for moderate constipation. Qty: 12 suppository, Refills: 0    exemestane (AROMASIN) 25 MG tablet Take 1 tablet (25 mg total) by mouth daily after breakfast. Qty: 30 tablet, Refills: 0   Associated Diagnoses: Metastatic breast cancer (HCC)    feeding supplement (BOOST / RESOURCE BREEZE) LIQD Take 1 Container by mouth 2 (two) times daily between meals. Qty: 30 Container, Refills: 0    folic acid (FOLVITE) 1 MG tablet Take 1 tablet (1 mg total) by mouth daily. Qty: 30 tablet, Refills: 1    methocarbamol (ROBAXIN) 500 MG tablet Take 1 tablet (500 mg total) by mouth every 6 (six) hours as needed for muscle spasms. Qty: 30 tablet, Refills: 1    polyethylene glycol (MIRALAX / GLYCOLAX) packet Take 17 g by mouth daily. Qty: 14 each, Refills: 0    potassium chloride (KLOR-CON) 20 MEQ packet Take 20 mEq by mouth  daily. Qty: 30 tablet, Refills: 0    thiamine 100 MG tablet Take 1 tablet (100 mg total) by mouth daily. Qty: 30 tablet, Refills: 1    vitamin B-12 1000 MCG tablet Take 1 tablet (1,000 mcg total) by mouth daily. Qty: 30 tablet, Refills: 1      CONTINUE these medications which have CHANGED   Details  oxyCODONE (OXY IR/ROXICODONE) 5 MG immediate release tablet Take 1 tablet (5 mg total) by mouth every 6 (six) hours as needed. Qty: 10 tablet, Refills: 0      CONTINUE these medications which have NOT CHANGED   Details  amLODipine (NORVASC) 5 MG tablet Take 1 tablet (5 mg total) by mouth daily. Qty: 30 tablet, Refills: 0    docusate sodium 100 MG CAPS Take 100 mg by mouth 2 (two) times daily. Qty: 30 capsule, Refills: 0    esomeprazole (NEXIUM) 20 MG capsule Take 20 mg by mouth as needed.    loratadine (CLARITIN) 10 MG tablet Take 10 mg by mouth daily as needed. For allergies    metoprolol tartrate (LOPRESSOR) 25 MG tablet Take 1 tablet (25 mg total) by mouth 2 (two) times daily. Qty: 60 tablet, Refills: 0    Multiple Vitamin (MULTIVITAMIN WITH MINERALS) TABS Take 1 tablet by mouth daily. Qty: 30 tablet, Refills: 0    pantoprazole (PROTONIX) 40 MG tablet Take 1 tablet (40 mg total) by mouth 2 (two) times daily before a meal. Qty: 60 tablet, Refills: 1  senna (SENOKOT) 8.6 MG TABS Take 1 tablet (8.6 mg total) by mouth 2 (two) times daily. Qty: 60 tablet, Refills: 0          Discharge Condition: Stable   Discharge Instructions Get Medicines reviewed and adjusted: Please take all your medications with you for your next visit with your Primary MD  Please request your Primary MD to go over all hospital tests and procedure/radiological results at the follow up, please ask your Primary MD to get all Hospital records sent to his/her office.  If you experience worsening of your admission symptoms, develop shortness of breath, life threatening emergency, suicidal or homicidal  thoughts you must seek medical attention immediately by calling 911 or calling your MD immediately if symptoms less severe.  You must read complete instructions/literature along with all the possible adverse reactions/side effects for all the Medicines you take and that have been prescribed to you. Take any new Medicines after you have completely understood and accpet all the possible adverse reactions/side effects.   Do not drive when taking Pain medications.   Do not take more than prescribed Pain, Sleep and Anxiety Medications  Special Instructions: If you have smoked or chewed Tobacco in the last 2 yrs please stop smoking, stop any regular Alcohol and or any Recreational drug use.  Wear Seat belts while driving.  Please note  You were cared for by a hospitalist during your hospital stay. Once you are discharged, your primary care physician will handle any further medical issues. Please note that NO REFILLS for any discharge medications will be authorized once you are discharged, as it is imperative that you return to your primary care physician (or establish a relationship with a primary care physician if you do not have one) for your aftercare needs so that they can reassess your need for medications and monitor your lab values.  Discharge Instructions    Diet - low sodium heart healthy    Complete by:  As directed    Increase activity slowly    Complete by:  As directed        Allergies  Allergen Reactions  . Ativan [Lorazepam] Other (See Comments)    Makes confused  . No Known Allergies       Disposition: SNF   Consults:  Oncology Orthopedics     Significant Diagnostic Studies:  Dg Forearm Right  Result Date: 12/20/2016 CLINICAL DATA:  Right arm fracture EXAM: RIGHT FOREARM - 2 VIEW COMPARISON:  None. FINDINGS: No fracture or dislocation is seen in the forearm. Distal humeral fracture may be visible on one of the two views, but is better demonstrated on the  prior study. Overlying cast obscures fine osseous detail. IMPRESSION: No fracture or dislocation is seen in the forearm. Known distal humerus fracture is not well visualized. Electronically Signed   By: Julian Hy M.D.   On: 12/20/2016 11:19   Ct Chest W Contrast  Result Date: 12/24/2016 CLINICAL DATA:  Inpatient admitted with pathologic right humerus fracture and left breast mass. Suspected metastatic breast cancer. Staging. EXAM: CT CHEST, ABDOMEN, AND PELVIS WITH CONTRAST TECHNIQUE: Multidetector CT imaging of the chest, abdomen and pelvis was performed following the standard protocol during bolus administration of intravenous contrast. CONTRAST:  163m ISOVUE-300 IOPAMIDOL (ISOVUE-300) INJECTION 61% COMPARISON:  12/17/2016 chest CT. FINDINGS: CT CHEST FINDINGS Cardiovascular: Top-normal heart size. No significant pericardial fluid/thickening. Atherosclerotic nonaneurysmal thoracic aorta. Normal caliber pulmonary arteries. No central pulmonary emboli. Mediastinum/Nodes: No discrete thyroid nodules. Unremarkable esophagus. No pathologically  enlarged axillary, mediastinal or hilar lymph nodes. Lungs/Pleura: No pneumothorax. No pleural effusion. Right middle lobe 5 mm (series 4/image 81) and 4 mm lingular (series 4/image 68) solid pulmonary nodules, both stable since 12/17/2016. Subsegmental atelectasis in the dependent lungs bilaterally. Otherwise no acute consolidative airspace disease, lung masses or additional significant pulmonary nodules. Musculoskeletal: Expansile lytic destructive bone lesion at the right third costovertebral junction (series 4/image 20) with mass-effect on the right thoracic spinal canal. Healed deformities of the lateral left seventh and eighth ribs. Partially visualized surgical fixation of the right distal humerus. Marked thoracic spondylosis . Irregular solid 5.3 x 2.8 cm outer left breast mass, extending to the skin (series 3/image 30). CT ABDOMEN PELVIS FINDINGS  Hepatobiliary: Diffuse hepatic steatosis. The known liver masses (at least 4) in the liver seen on the 12/17/2016 chest CT study are poorly visualized on the routine phase sequence through the liver on today's scan, probably due to differences in contrast timing. Representative liver masses include a 1.7 x 1.5 cm liver dome lesion (series 3/ image 43), a 1.5 x 1.1 cm segment 4B left liver lobe lesion (series 8/ image 7) and a 0.9 x 0.7 cm segment 3 left liver lobe lesion (series 8/image 8). Normal gallbladder with no radiopaque cholelithiasis. No biliary ductal dilatation. Pancreas: Normal, with no mass or duct dilation. Spleen: Normal size. No mass. Adrenals/Urinary Tract: Suggestion of an indeterminate 1.2 cm lower left adrenal nodule (series 3/ image 54) with density 875 HU. No additional discrete adrenal nodules. No hydronephrosis. Mild segmental renal cortical scarring in the upper right kidney. No renal mass. Bladder collapsed by indwelling Foley catheter, limiting bladder evaluation. Grossly normal bladder. Stomach/Bowel: Grossly normal stomach. Normal caliber small bowel with no small bowel wall thickening. Appendectomy. Normal large bowel with no diverticulosis, large bowel wall thickening or pericolonic fat stranding. Vascular/Lymphatic: Atherosclerotic nonaneurysmal abdominal aorta. Patent portal, splenic, hepatic and renal veins. No pathologically enlarged lymph nodes in the abdomen or pelvis. Reproductive: Suggestion of abnormal endometrial thickening (approximately 10 mm). No adnexal mass. Other: No pneumoperitoneum, ascites or focal fluid collection. Small fat containing umbilical hernia. Musculoskeletal: No aggressive appearing focal osseous lesions. Mild L3 and L4 superior vertebral compression fractures of indeterminate chronicity. Moderate lumbar spondylosis . IMPRESSION: 1. Irregular solid 5.3 cm upper left breast mass extending to the skin, suspicious for primary left breast malignancy.  Recommend correlation with diagnostic mammographic evaluation. 2. Expansile lytic destructive bone lesion at the right third costovertebral junction with mass-effect on the right thoracic spinal canal, compatible with osseous metastasis. Consider correlation with thoracic spine MRI without and with IV contrast to evaluate for possible thoracic cord compression, as clinically warranted . 3. Two subcentimeter pulmonary nodules, indeterminate for metastases. Recommend attention on follow-up chest CT in 3 months. 4. Four scattered small liver masses, suspicious for liver metastases. These could be further evaluated with MRI abdomen without and with IV contrast versus PET-CT, as clinically warranted . 5. Possible small left adrenal metastasis. 6. Suggestion of abnormal endometrial thickening (10 mm). Recommend correlation would transabdominal and transvaginal pelvic ultrasound. 7. Mild L3 and L4 vertebral compression fractures of indeterminate chronicity. 8. Additional chronic findings as above. Electronically Signed   By: Ilona Sorrel M.D.   On: 12/24/2016 18:07   Ct Angio Chest Pe W Or Wo Contrast  Result Date: 12/17/2016 CLINICAL DATA:  Shortness of Breath EXAM: CT ANGIOGRAPHY CHEST WITH CONTRAST TECHNIQUE: Multidetector CT imaging of the chest was performed using the standard protocol during bolus administration of intravenous  contrast. Multiplanar CT image reconstructions and MIPs were obtained to evaluate the vascular anatomy. CONTRAST:  75 mL Isovue 370 nonionic COMPARISON:  Chest radiograph December 17, 2016 FINDINGS: Cardiovascular: There is no demonstrable pulmonary embolus. There is no thoracic aortic aneurysm or dissection. Main pulmonary outflow tract measures 3.1 cm in length. There is mild calcification at the origin of the left subclavian artery. Other visualized great vessels appear unremarkable. Pericardium is not appreciably thickened. There is left ventricular hypertrophy. Mediastinum/Nodes: Visualized  thyroid appears unremarkable. There is no appreciable thoracic adenopathy. Lungs/Pleura: There is mild bibasilar atelectasis. There is no parenchymal lung edema or consolidation. No pleural effusion or pleural thickening evident. Upper Abdomen: There is diffuse hepatic steatosis. There is an enhancing focus in the anterior segment of the right lobe of the liver measuring 1.7 x 1.7 cm. A similar appearing lesion is noted near the dome of the liver in the medial segment left lobe measuring 1.5 x 1.3 cm. No other focal liver lesions are apparent ; note that liver is incompletely visualized. Visualized upper abdominal structures otherwise appear unremarkable. Musculoskeletal: There is degenerative change in the thoracic spine. There are no blastic or lytic bone lesions. There are several healed rib fractures on the left. Review of the MIP images confirms the above findings. IMPRESSION: No demonstrable pulmonary embolus. Prominence of the main pulmonary outflow tract suggests a degree of pulmonary arterial hypertension. No adenopathy. No lung edema or consolidation.  Bibasilar atelectasis noted. Enhancing lesions in the liver near the dome noted. Etiology uncertain. This finding warrants nonemergent pre and serial post-contrast CT or MR of the liver to further evaluate. Old healed rib fractures on the left. Left ventricular hypertrophy. Electronically Signed   By: Lowella Grip III M.D.   On: 12/17/2016 13:51   Mr Cervical Spine W Wo Contrast  Result Date: 12/25/2016 CLINICAL DATA:  Metastatic disease.  Breast mass. EXAM: MRI TOTAL SPINE WITHOUT AND WITH CONTRAST TECHNIQUE: Multisequence MR imaging of the spine from the cervical spine to the sacrum was performed prior to and following IV contrast administration for evaluation of spinal metastatic disease. CONTRAST:  39m MULTIHANCE GADOBENATE DIMEGLUMINE 529 MG/ML IV SOLN COMPARISON:  CT chest abdomen pelvis 12/24/2016 FINDINGS: MRI CERVICAL SPINE FINDINGS Image  quality degraded by motion. Alignment: Normal Vertebrae: No fracture identified. Enhancing lesion C7 vertebral body compatible with metastatic disease. No epidural extension. T3 metastatic disease also noted. Cord: Negative for cord compression.  Cord signal normal. Posterior Fossa, vertebral arteries, paraspinal tissues: Negative Disc levels: Mild disc degeneration at C4-5 and C5-6 and C6-7. Mild facet degeneration on the left at C4-5. Left foraminal narrowing C5-6 due to disc protrusion and spurring. MRI THORACIC SPINE FINDINGS Alignment:  Normal Vertebrae: Metastatic disease throughout the T3 vertebral body extending into the pedicle and posterior elements. There is also tumor in the right foramen at T2-3 and T3-4 which could cause radicular symptoms. Slight epidural tumor without cord compression. Tumor also present in the spinous processes of T1, T2, T3, T8. Small tumor deposit T5 vertebral body and right superior articulating facet. Tumor in the right sixth rib. Small enhancing lesions T12 vertebral body compatible with metastatic disease. Accentuated dorsal kyphosis.  No pathologic fracture Cord:  Negative for cord compression.  Spinal cord signal normal. Paraspinal and other soft tissues: Negative Disc levels: Multilevel thoracic disc degeneration without significant spinal stenosis. MRI LUMBAR SPINE FINDINGS Segmentation:  Normal Alignment:  Normal Vertebrae: Enhancing metastatic disease is seen in the lumbar spine. Enhancing lesion on the right  at L2. Diffuse vertebral body enhancement of L3 with pathologic fracture. Enhancing tumor extends in the pedicle and posterior elements on the right without foraminal encroachment. Small enhancing lesion S2 on the left. Mild fracture of L4 appears chronic and benign. Conus medullaris: Extends to the L1-2 level and appears normal. Paraspinal and other soft tissues: Diffuse paraspinous muscle atrophy. No retroperitoneal adenopathy Disc levels: L1-2:  Negative L2-3:   Disc and facet degeneration with moderate spinal stenosis L3-4:  Disc and facet degeneration with severe spinal stenosis L4-5:  Disc and facet degeneration with moderate spinal stenosis L5-S1:  Severe facet degeneration without stenosis. IMPRESSION: Metastatic disease C7 vertebral body without epidural tumor or fracture Multiple metastatic deposits in the thoracic and lumbar spine. Prominent tumor is present on the right at T3 extending into the soft tissues as well as the foramina on the right at T2-3 and T3-4. See above detailed Advanced lumbar degenerative changes with spinal stenosis at L2-3, L3-4, L4-5 Electronically Signed   By: Franchot Gallo M.D.   On: 12/25/2016 11:51   Mr Thoracic Spine W Wo Contrast  Result Date: 12/25/2016 CLINICAL DATA:  Metastatic disease.  Breast mass. EXAM: MRI TOTAL SPINE WITHOUT AND WITH CONTRAST TECHNIQUE: Multisequence MR imaging of the spine from the cervical spine to the sacrum was performed prior to and following IV contrast administration for evaluation of spinal metastatic disease. CONTRAST:  76m MULTIHANCE GADOBENATE DIMEGLUMINE 529 MG/ML IV SOLN COMPARISON:  CT chest abdomen pelvis 12/24/2016 FINDINGS: MRI CERVICAL SPINE FINDINGS Image quality degraded by motion. Alignment: Normal Vertebrae: No fracture identified. Enhancing lesion C7 vertebral body compatible with metastatic disease. No epidural extension. T3 metastatic disease also noted. Cord: Negative for cord compression.  Cord signal normal. Posterior Fossa, vertebral arteries, paraspinal tissues: Negative Disc levels: Mild disc degeneration at C4-5 and C5-6 and C6-7. Mild facet degeneration on the left at C4-5. Left foraminal narrowing C5-6 due to disc protrusion and spurring. MRI THORACIC SPINE FINDINGS Alignment:  Normal Vertebrae: Metastatic disease throughout the T3 vertebral body extending into the pedicle and posterior elements. There is also tumor in the right foramen at T2-3 and T3-4 which could cause  radicular symptoms. Slight epidural tumor without cord compression. Tumor also present in the spinous processes of T1, T2, T3, T8. Small tumor deposit T5 vertebral body and right superior articulating facet. Tumor in the right sixth rib. Small enhancing lesions T12 vertebral body compatible with metastatic disease. Accentuated dorsal kyphosis.  No pathologic fracture Cord:  Negative for cord compression.  Spinal cord signal normal. Paraspinal and other soft tissues: Negative Disc levels: Multilevel thoracic disc degeneration without significant spinal stenosis. MRI LUMBAR SPINE FINDINGS Segmentation:  Normal Alignment:  Normal Vertebrae: Enhancing metastatic disease is seen in the lumbar spine. Enhancing lesion on the right at L2. Diffuse vertebral body enhancement of L3 with pathologic fracture. Enhancing tumor extends in the pedicle and posterior elements on the right without foraminal encroachment. Small enhancing lesion S2 on the left. Mild fracture of L4 appears chronic and benign. Conus medullaris: Extends to the L1-2 level and appears normal. Paraspinal and other soft tissues: Diffuse paraspinous muscle atrophy. No retroperitoneal adenopathy Disc levels: L1-2:  Negative L2-3:  Disc and facet degeneration with moderate spinal stenosis L3-4:  Disc and facet degeneration with severe spinal stenosis L4-5:  Disc and facet degeneration with moderate spinal stenosis L5-S1:  Severe facet degeneration without stenosis. IMPRESSION: Metastatic disease C7 vertebral body without epidural tumor or fracture Multiple metastatic deposits in the thoracic and lumbar  spine. Prominent tumor is present on the right at T3 extending into the soft tissues as well as the foramina on the right at T2-3 and T3-4. See above detailed Advanced lumbar degenerative changes with spinal stenosis at L2-3, L3-4, L4-5 Electronically Signed   By: Franchot Gallo M.D.   On: 12/25/2016 11:51   Mr Lumbar Spine W Wo Contrast  Result Date:  12/25/2016 CLINICAL DATA:  Metastatic disease.  Breast mass. EXAM: MRI TOTAL SPINE WITHOUT AND WITH CONTRAST TECHNIQUE: Multisequence MR imaging of the spine from the cervical spine to the sacrum was performed prior to and following IV contrast administration for evaluation of spinal metastatic disease. CONTRAST:  30m MULTIHANCE GADOBENATE DIMEGLUMINE 529 MG/ML IV SOLN COMPARISON:  CT chest abdomen pelvis 12/24/2016 FINDINGS: MRI CERVICAL SPINE FINDINGS Image quality degraded by motion. Alignment: Normal Vertebrae: No fracture identified. Enhancing lesion C7 vertebral body compatible with metastatic disease. No epidural extension. T3 metastatic disease also noted. Cord: Negative for cord compression.  Cord signal normal. Posterior Fossa, vertebral arteries, paraspinal tissues: Negative Disc levels: Mild disc degeneration at C4-5 and C5-6 and C6-7. Mild facet degeneration on the left at C4-5. Left foraminal narrowing C5-6 due to disc protrusion and spurring. MRI THORACIC SPINE FINDINGS Alignment:  Normal Vertebrae: Metastatic disease throughout the T3 vertebral body extending into the pedicle and posterior elements. There is also tumor in the right foramen at T2-3 and T3-4 which could cause radicular symptoms. Slight epidural tumor without cord compression. Tumor also present in the spinous processes of T1, T2, T3, T8. Small tumor deposit T5 vertebral body and right superior articulating facet. Tumor in the right sixth rib. Small enhancing lesions T12 vertebral body compatible with metastatic disease. Accentuated dorsal kyphosis.  No pathologic fracture Cord:  Negative for cord compression.  Spinal cord signal normal. Paraspinal and other soft tissues: Negative Disc levels: Multilevel thoracic disc degeneration without significant spinal stenosis. MRI LUMBAR SPINE FINDINGS Segmentation:  Normal Alignment:  Normal Vertebrae: Enhancing metastatic disease is seen in the lumbar spine. Enhancing lesion on the right at  L2. Diffuse vertebral body enhancement of L3 with pathologic fracture. Enhancing tumor extends in the pedicle and posterior elements on the right without foraminal encroachment. Small enhancing lesion S2 on the left. Mild fracture of L4 appears chronic and benign. Conus medullaris: Extends to the L1-2 level and appears normal. Paraspinal and other soft tissues: Diffuse paraspinous muscle atrophy. No retroperitoneal adenopathy Disc levels: L1-2:  Negative L2-3:  Disc and facet degeneration with moderate spinal stenosis L3-4:  Disc and facet degeneration with severe spinal stenosis L4-5:  Disc and facet degeneration with moderate spinal stenosis L5-S1:  Severe facet degeneration without stenosis. IMPRESSION: Metastatic disease C7 vertebral body without epidural tumor or fracture Multiple metastatic deposits in the thoracic and lumbar spine. Prominent tumor is present on the right at T3 extending into the soft tissues as well as the foramina on the right at T2-3 and T3-4. See above detailed Advanced lumbar degenerative changes with spinal stenosis at L2-3, L3-4, L4-5 Electronically Signed   By: CFranchot GalloM.D.   On: 12/25/2016 11:51   Nm Bone Scan Whole Body  Result Date: 12/26/2016 CLINICAL DATA:  Breast cancer. EXAM: NUCLEAR MEDICINE WHOLE BODY BONE SCAN TECHNIQUE: Whole body anterior and posterior images were obtained approximately 3 hours after intravenous injection of radiopharmaceutical. RADIOPHARMACEUTICALS:  25 MCi [id number] IV COMPARISON:  Bone survey 12/23/2016. FINDINGS: Bilateral renal function excretion. Increased focal activity noted over the occiput. Multiple areas of increased activity  noted over the spine and ribs. Focal area of increased activity noted over the distal left humerus. These findings are consistent with metastatic disease. IMPRESSION: Findings consistent with metastatic disease, with involvement of the skull, spine, ribs, and left humerus. Electronically Signed   By:  Marcello Moores  Register   On: 12/26/2016 16:31   Ct Abdomen Pelvis W Contrast  Result Date: 12/24/2016 CLINICAL DATA:  Inpatient admitted with pathologic right humerus fracture and left breast mass. Suspected metastatic breast cancer. Staging. EXAM: CT CHEST, ABDOMEN, AND PELVIS WITH CONTRAST TECHNIQUE: Multidetector CT imaging of the chest, abdomen and pelvis was performed following the standard protocol during bolus administration of intravenous contrast. CONTRAST:  163m ISOVUE-300 IOPAMIDOL (ISOVUE-300) INJECTION 61% COMPARISON:  12/17/2016 chest CT. FINDINGS: CT CHEST FINDINGS Cardiovascular: Top-normal heart size. No significant pericardial fluid/thickening. Atherosclerotic nonaneurysmal thoracic aorta. Normal caliber pulmonary arteries. No central pulmonary emboli. Mediastinum/Nodes: No discrete thyroid nodules. Unremarkable esophagus. No pathologically enlarged axillary, mediastinal or hilar lymph nodes. Lungs/Pleura: No pneumothorax. No pleural effusion. Right middle lobe 5 mm (series 4/image 81) and 4 mm lingular (series 4/image 68) solid pulmonary nodules, both stable since 12/17/2016. Subsegmental atelectasis in the dependent lungs bilaterally. Otherwise no acute consolidative airspace disease, lung masses or additional significant pulmonary nodules. Musculoskeletal: Expansile lytic destructive bone lesion at the right third costovertebral junction (series 4/image 20) with mass-effect on the right thoracic spinal canal. Healed deformities of the lateral left seventh and eighth ribs. Partially visualized surgical fixation of the right distal humerus. Marked thoracic spondylosis . Irregular solid 5.3 x 2.8 cm outer left breast mass, extending to the skin (series 3/image 30). CT ABDOMEN PELVIS FINDINGS Hepatobiliary: Diffuse hepatic steatosis. The known liver masses (at least 4) in the liver seen on the 12/17/2016 chest CT study are poorly visualized on the routine phase sequence through the liver on today's  scan, probably due to differences in contrast timing. Representative liver masses include a 1.7 x 1.5 cm liver dome lesion (series 3/ image 43), a 1.5 x 1.1 cm segment 4B left liver lobe lesion (series 8/ image 7) and a 0.9 x 0.7 cm segment 3 left liver lobe lesion (series 8/image 8). Normal gallbladder with no radiopaque cholelithiasis. No biliary ductal dilatation. Pancreas: Normal, with no mass or duct dilation. Spleen: Normal size. No mass. Adrenals/Urinary Tract: Suggestion of an indeterminate 1.2 cm lower left adrenal nodule (series 3/ image 54) with density 875 HU. No additional discrete adrenal nodules. No hydronephrosis. Mild segmental renal cortical scarring in the upper right kidney. No renal mass. Bladder collapsed by indwelling Foley catheter, limiting bladder evaluation. Grossly normal bladder. Stomach/Bowel: Grossly normal stomach. Normal caliber small bowel with no small bowel wall thickening. Appendectomy. Normal large bowel with no diverticulosis, large bowel wall thickening or pericolonic fat stranding. Vascular/Lymphatic: Atherosclerotic nonaneurysmal abdominal aorta. Patent portal, splenic, hepatic and renal veins. No pathologically enlarged lymph nodes in the abdomen or pelvis. Reproductive: Suggestion of abnormal endometrial thickening (approximately 10 mm). No adnexal mass. Other: No pneumoperitoneum, ascites or focal fluid collection. Small fat containing umbilical hernia. Musculoskeletal: No aggressive appearing focal osseous lesions. Mild L3 and L4 superior vertebral compression fractures of indeterminate chronicity. Moderate lumbar spondylosis . IMPRESSION: 1. Irregular solid 5.3 cm upper left breast mass extending to the skin, suspicious for primary left breast malignancy. Recommend correlation with diagnostic mammographic evaluation. 2. Expansile lytic destructive bone lesion at the right third costovertebral junction with mass-effect on the right thoracic spinal canal, compatible with  osseous metastasis. Consider correlation with thoracic spine  MRI without and with IV contrast to evaluate for possible thoracic cord compression, as clinically warranted . 3. Two subcentimeter pulmonary nodules, indeterminate for metastases. Recommend attention on follow-up chest CT in 3 months. 4. Four scattered small liver masses, suspicious for liver metastases. These could be further evaluated with MRI abdomen without and with IV contrast versus PET-CT, as clinically warranted . 5. Possible small left adrenal metastasis. 6. Suggestion of abnormal endometrial thickening (10 mm). Recommend correlation would transabdominal and transvaginal pelvic ultrasound. 7. Mild L3 and L4 vertebral compression fractures of indeterminate chronicity. 8. Additional chronic findings as above. Electronically Signed   By: Ilona Sorrel M.D.   On: 12/24/2016 18:07   [place] Core Biopsy  Result Date: 12/26/2016 CLINICAL DATA:  66 year old who presented this admission with a pathological fracture involving the distal right humerus for which she will underwent ORIF. CT examination 12/24/2016 demonstrated a 5.3 cm mass involving the upper outer quadrant of the left breast. Biopsy is requested. EXAM: ULTRASOUND GUIDED LEFT BREAST CORE NEEDLE BIOPSY COMPARISON:  No prior breast imaging. CT chest 12/24/2016 is correlated. FINDINGS: I met with the patient and we discussed the procedure of ultrasound-guided biopsy, including benefits and alternatives. We discussed the high likelihood of a successful procedure. We discussed the risks of the procedure, including infection, bleeding, tissue injury, clip migration, and inadequate sampling. Informed written consent was given. The usual time-out protocol was performed immediately prior to the procedure. Initial ultrasound imaging shows a large hypoechoic mass with irregular margins at the 1 o'clock position approximately 6 cm from the nipple, associated with microcalcifications and associated with  skin involvement. In fact, the overlying skin is ulcerated. Due to its large size, an accurate ultrasound measurement was unobtainable, though the 5.3 cm size from CT appears accurate. Sonographic evaluation of the left axilla demonstrates no pathologic lymphadenopathy. Lesion quadrant: Upper outer quadrant. Using sterile technique with chlorhexidine as skin antisepsis, 1% Lidocaine as local anesthetic, under direct ultrasound visualization, a 16 gauge spring-loaded core needle device was used to perform biopsy of the mass involving the upper outer quadrant of the left breast using a lateral approach. Six core samples were obtained and each was immediately placed in formalin. IMPRESSION: 1. Ultrasound guided biopsy of a large mass involving the upper outer quadrant of the left breast. No apparent complications. 2. The skin overlying the mass in the left upper quadrant is ulcerated, and sonographic evaluation confirms skin involvement. 3. No pathologic left axillary lymphadenopathy. Electronically Signed   By: Evangeline Dakin M.D.   On: 12/26/2016 16:41   Dg Chest Port 1 View  Result Date: 12/23/2016 CLINICAL DATA:  Shortness of breath EXAM: PORTABLE CHEST 1 VIEW COMPARISON:  12/17/2016 FINDINGS: Linear atelectasis at the medial left base. Cannot exclude small left pleural effusion. Mild cardiomegaly with atherosclerosis. No overt pulmonary edema. No pneumothorax. Old left rib fracture. IMPRESSION: 1. Cardiomegaly without edema or infiltrate 2. Subsegmental atelectasis at the left lung base. Electronically Signed   By: Donavan Foil M.D.   On: 12/23/2016 19:08   Dg Chest Portable 1 View  Result Date: 12/17/2016 CLINICAL DATA:  Shortness of breath and tachycardia EXAM: PORTABLE CHEST 1 VIEW COMPARISON:  05/13/2012 FINDINGS: Cardiomegaly noted. There is no evidence of focal airspace disease, pulmonary edema, suspicious pulmonary nodule/mass, pleural effusion, or pneumothorax. No acute bony abnormalities are  identified. IMPRESSION: Cardiomegaly without evidence of acute cardiopulmonary disease. Electronically Signed   By: Margarette Canada M.D.   On: 12/17/2016 12:11   Dg Bone Survey  Met  Result Date: 12/23/2016 CLINICAL DATA:  (Breast lesion. Pathologic fracture of the right humerus. Status post ORIF of the right humerus. Evaluate for metastasis. EXAM: METASTATIC BONE SURVEY COMPARISON:  CXR and right humerus radiographs from 12/17/2016, left ankle radiographs from 05/11/2012, left wrist radiographs small 05/11/2012 FINDINGS: Lateral skull: Occipital lytic skull lucency measuring 13 x 8 mm. No fracture. Both shoulders: Negative for lytic or blastic disease. No acute fracture nor dislocation. Left old sixth and seventh rib fractures are incidentally included. Both humeri: Pathologic fracture through moth eaten lytic lesion of the distal right humeral diaphysis transfixed by plate and screws. Lesion at the junction of the middle and distal third of the humeral shaft. Left humerus is negative. Radius and ulna: Negative for lytic or blastic disease. Volar plate and screw fixation across distal left radius fracture. Cervical spine AP and lateral: Degenerative disc and facet arthropathy. No suspicious osseous abnormality. Thoracic spine: Multilevel degenerative disc and endplate changes consistent with spondylosis. Lumbar spine: Chronic appearing superior endplate compressions of L1, L3 and L4 without retropulsion. No lytic or blastic disease. Lower lumbar facet arthropathy from L3 through S1. Slight disc space narrowing at L5-S1. PA chest: Normal size heart. Aortic atherosclerosis. Atelectasis at the lung bases. No dominant mass. Elevation of the right hemidiaphragm versus eventration. Old left-sided rib fractures. AP pelvis: Lower lumbar facet arthropathy. Intact SI joints and pubic symphysis with osteoarthritis of the pubic symphysis. Old right inferior pubic ramus fracture. No suspicious lytic or blastic disease of the  pelvis and included hips. Both femora, tibia and fibula: Negative for lytic or blastic disease. ORIF of left bimalleolar fractures. IMPRESSION: 13 x 8 mm lytic focus involving the occiput of the skull which could be a normal variant such is an arachnoid granulation. CT may help for further correlation. No additional lytic or blastic disease are apparent. Pathologic fracture transfixed by plate and screws involving the distal right humeral shaft. ORIF of the distal left radius and left bimalleolar fractures are also noted. Cervical, thoracic and lumbar spondylosis. Chronic benign-appearing endplate compressions of L1, L3 and L4. Electronically Signed   By: Ashley Royalty M.D.   On: 12/23/2016 17:11   Dg Humerus Right  Result Date: 12/23/2016 CLINICAL DATA:  OPEN REDUCTION INTERNAL FIXATION (ORIF) DISTAL HUMERUS FRACTURE (Right Arm Upper). Radiation Safety Timeout performed by BIW. Fluoro time 12 seconds. EXAM: DG C-ARM 61-120 MIN; RIGHT HUMERUS - 2+ VIEW COMPARISON:  12/20/2016 FINDINGS: Patient has undergone screw plate fixation of right humerus fracture. A cortical screw traverses the fracture site. There is near anatomic alignment. Elbow is unremarkable in appearance. IMPRESSION: Status post ORIF of the right humerus. Lucency at the fracture site, raising the question of pathologic fracture. Correlation with history is recommended. Electronically Signed   By: Nolon Nations M.D.   On: 12/23/2016 13:53   Dg Humerus Right  Result Date: 12/17/2016 CLINICAL DATA:  Acute onset pain while lifting heavy object EXAM: RIGHT HUMERUS - 2+ VIEW COMPARISON:  None. FINDINGS: Frontal and lateral views were obtained. There is an obliquely oriented fracture of the distal humerus with medial angulation and anterior displacement of the distal fracture fragment with respect proximal fragment. No other fractures. No dislocations. No abnormal periosteal reaction or appreciable arthropathy. IMPRESSION: Obliquely oriented fracture  distal humerus with displacement angulation of fracture fragments. No dislocation. No abnormal periosteal reaction. No appreciable arthropathic change. Electronically Signed   By: Lowella Grip III M.D.   On: 12/17/2016 11:43   Dg Hand Complete Left  Result Date: 12/20/2016 CLINICAL DATA:  Left hand swelling EXAM: LEFT HAND - COMPLETE 3+ VIEW COMPARISON:  None. FINDINGS: No evidence of acute fracture or dislocation. Status post ORIF of the distal radius. Moderate degenerative changes at the 1st carpometacarpal joint. The visualized soft tissues are unremarkable. IMPRESSION: No acute osseus abnormality is seen. Status post ORIF of the distal radius. Moderate degenerative changes of the 1st carpometacarpal joint. Electronically Signed   By: Julian Hy M.D.   On: 12/20/2016 11:10   Dg C-arm 1-60 Min  Result Date: 12/23/2016 CLINICAL DATA:  OPEN REDUCTION INTERNAL FIXATION (ORIF) DISTAL HUMERUS FRACTURE (Right Arm Upper). Radiation Safety Timeout performed by BIW. Fluoro time 12 seconds. EXAM: DG C-ARM 61-120 MIN; RIGHT HUMERUS - 2+ VIEW COMPARISON:  12/20/2016 FINDINGS: Patient has undergone screw plate fixation of right humerus fracture. A cortical screw traverses the fracture site. There is near anatomic alignment. Elbow is unremarkable in appearance. IMPRESSION: Status post ORIF of the right humerus. Lucency at the fracture site, raising the question of pathologic fracture. Correlation with history is recommended. Electronically Signed   By: Nolon Nations M.D.   On: 12/23/2016 13:53       There were no vitals filed for this visit.   Microbiology: Recent Results (from the past 240 hour(s))  MRSA PCR Screening     Status: None   Collection Time: 12/19/16  5:29 PM  Result Value Ref Range Status   MRSA by PCR NEGATIVE NEGATIVE Final    Comment:        The GeneXpert MRSA Assay (FDA approved for NASAL specimens only), is one component of a comprehensive MRSA  colonization surveillance program. It is not intended to diagnose MRSA infection nor to guide or monitor treatment for MRSA infections.   Culture, Urine     Status: Abnormal   Collection Time: 12/20/16 11:10 AM  Result Value Ref Range Status   Specimen Description URINE, RANDOM  Final   Special Requests NONE  Final   Culture <10,000 COLONIES/mL INSIGNIFICANT GROWTH (A)  Final   Report Status 12/21/2016 FINAL  Final  Surgical pcr screen     Status: None   Collection Time: 12/22/16  9:09 PM  Result Value Ref Range Status   MRSA, PCR NEGATIVE NEGATIVE Final   Staphylococcus aureus NEGATIVE NEGATIVE Final    Comment:        The Xpert SA Assay (FDA approved for NASAL specimens in patients over 41 years of age), is one component of a comprehensive surveillance program.  Test performance has been validated by [facility] for patients greater than or equal to 67 year old. It is not intended to diagnose infection nor to guide or monitor treatment.        Blood Culture    Component Value Date/Time   SDES URINE, RANDOM 12/20/2016 1110   SPECREQUEST NONE 12/20/2016 1110   CULT <10,000 COLONIES/mL INSIGNIFICANT GROWTH (A) 12/20/2016 1110   REPTSTATUS 12/21/2016 FINAL 12/20/2016 1110      Labs: Results for orders placed or performed during the hospital encounter of 12/19/16 (from the past 48 hour(s))  Protime-INR     Status: None   Collection Time: 12/26/16  8:48 AM  Result Value Ref Range   Prothrombin Time 14.1 11.4 - 15.2 seconds   INR 1.09   Magnesium     Status: None   Collection Time: 12/26/16 10:57 AM  Result Value Ref Range   Magnesium 1.9 1.7 - 2.4 mg/dL  CBC  Status: Abnormal   Collection Time: 12/27/16  4:22 AM  Result Value Ref Range   WBC 7.7 4.0 - 10.5 K/uL   RBC 3.48 (L) 3.87 - 5.11 MIL/uL   Hemoglobin 11.8 (L) 12.0 - 15.0 g/dL   HCT 37.3 36.0 - 46.0 %   MCV 107.2 (H) 78.0 - 100.0 fL   MCH 33.9 26.0 - 34.0 pg   MCHC 31.6 30.0 - 36.0 g/dL   RDW  14.6 11.5 - 15.5 %   Platelets 409 (H) 150 - 400 K/uL  Comprehensive metabolic panel     Status: Abnormal   Collection Time: 12/27/16  4:22 AM  Result Value Ref Range   Sodium 137 135 - 145 mmol/L   Potassium 3.3 (L) 3.5 - 5.1 mmol/L   Chloride 99 (L) 101 - 111 mmol/L   CO2 29 22 - 32 mmol/L   Glucose, Bld 105 (H) 65 - 99 mg/dL   BUN 9 6 - 20 mg/dL   Creatinine, Ser 0.55 0.44 - 1.00 mg/dL   Calcium 8.7 (L) 8.9 - 10.3 mg/dL   Total Protein 5.8 (L) 6.5 - 8.1 g/dL   Albumin 2.5 (L) 3.5 - 5.0 g/dL   AST 29 15 - 41 U/L   ALT 19 14 - 54 U/L   Alkaline Phosphatase 100 38 - 126 U/L   Total Bilirubin 0.5 0.3 - 1.2 mg/dL   GFR calc non Af Amer >60 >60 mL/min   GFR calc Af Amer >60 >60 mL/min    Comment: (NOTE) The eGFR has been calculated using the CKD EPI equation. This calculation has not been validated in all clinical situations. eGFR's persistently <60 mL/min signify possible Chronic Kidney Disease.    Anion gap 9 5 - 15     Lipid Panel     Component Value Date/Time   CHOL 186 12/17/2016 1153   TRIG 64 12/17/2016 1153   HDL 94 12/17/2016 1153   CHOLHDL 2.0 12/17/2016 1153   VLDL 13 12/17/2016 1153   LDLCALC 79 12/17/2016 1153     Lab Results  Component Value Date   HGBA1C 5.7 (H) 12/17/2016       HPI :  66 y.o. female with long-standing history of tobacco and alcohol abuse -last seen by a primary M.D. more than 50 years back, transferred from [facility] to the orthopedic service for evaluation of right supracondylar distal humerus fracture with acute radial nerve palsy. Further evaluation revealed that the distal humerus fracture was pathological, and upon further evaluation-she was found to have a left breast mass with extensive metastatic disease to her spine,and liver.. See below for further details   HOSPITAL COURSE:  Right supracondylar humerus pathological fracture with acute radial palsy: Patient is status post open reduction internal fixation of distal humerus  fracture. PT/OT. Splint from OT. Postop care deferred to the orthopedic service. See recommendations from 6/18. Suspected to be a pathological fracture-radiation oncology aware of this patient-and the need for possible palliative radiation.   Suspected left breast cancer with metastatic disease to spine/liver: Oncology thankfully expedited ultrasound-guided biopsy and bone scan which was done 6/18. Bone scan positive for metastatic disease. MRI of the entire spine showed significant metastatic disease around the thoracic 3rd vertebra, but numerous other vertebra is also involved.  Marland Kitchen MRI findings was discussed with radiation oncology on call-Dr Ennever since no evidence of cord compression, no immediate recommendations was provided-radiation oncology  consulted 6/18. Dr. Tammi Klippel, reviewed the records and imaging studies. Operating final pathology. Suspect patient  would need radiation to right humerus after appropriate healing, T3, L3. Patient will be followed outpatient by radiation oncology.CA 27.29 was normal. Patient started on Aromasin by oncology . Patient will also need follow-up with Dr. Tammi Klippel  Acute toxic metabolic encephalopathy: Resolved, she is completely awake and alert. Etiology of encephalopathy felt to be multifactorial-secondary to alcohol withdrawal and medication effect.   Alcohol withdrawal: No further withdrawal symptoms, last drink on 6/9, should be out of the window for further alcohol withdrawal symptoms. Managed with Ativan per protocol.   Antral gastritis-EtOH an NSAID-induced: Status post EGD at [facility] for antral gastritis-continue with PPI. No overt evidence of bleeding at this time.  Tobacco abuse:  Nicotine cessation counseling done  Macrocytosis: Folate levels within normal limits-vitamin B-12 level-borderline low 275 . will begin B-12 supplementation  Hypertension: Continue metoprolol/Norvasc    Mildly elevated TSH: Likely subclinical hypothyroidism-no  indication for supplementation-stable for outpatient follow-up.    Discharge Exam:   Blood pressure (!) 152/68, pulse (!) 110, temperature 100 F (37.8 C), temperature source Oral, resp. rate 18, SpO2 94 %. General appearance :Awake, alert, not in any distress.  Eyes:, pupils equally reactive to light and accomodation,no scleral icterus. HEENT: Atraumatic and Normocephalic Neck: supple, no JVD. Resp:Good air entry bilaterally CVS: S1 S2 regular, no murmurs.  GI: Bowel sounds present, Non tender and not distended with no gaurding, rigidity or rebound. Extremities: B/L Lower Ext shows no edema      Contact information for follow-up providers    Volanda Napoleon, MD. Call.   Specialty:  Oncology Why:  Hospital follow-up for left breast mass Contact information: [place] [address] [phone]        PCP. Call.   Why:  Make appointment in 3-5 days       Tyler Pita, MD. Call.   Specialty:  Radiation Oncology Why:  Call to make follow-up appointment in 1-2 weeks Contact information: [place] [id number] [phone]            Contact information for after-discharge care    Destination    [facility] Follow up.   Specialty:  [place] information: [place] [phone]                  Signed: Reyne Dumas 12/28/2016, 8:47 AM        Time spent >1 hour

## 2016-12-28 NOTE — Social Work (Signed)
CSW received a call back from [facility] declining to accept patient for placement.  CSW will f/u with clinical team on next options for DC.

## 2017-01-01 ENCOUNTER — Encounter (HOSPITAL_COMMUNITY): Payer: Self-pay | Admitting: Orthopedic Surgery

## 2017-01-01 NOTE — Anesthesia Postprocedure Evaluation (Signed)
Anesthesia Post Note  Patient: Taylor Brown  Procedure(s) Performed: Procedure(s) (LRB): OPEN REDUCTION INTERNAL FIXATION (ORIF) DISTAL HUMERUS FRACTURE (Right) TOENAIL TRIMMING (Bilateral)     Patient location during evaluation: PACU Anesthesia Type: General and Regional Level of consciousness: sedated and patient cooperative Pain management: pain level controlled Vital Signs Assessment: post-procedure vital signs reviewed and stable Respiratory status: spontaneous breathing Cardiovascular status: stable Anesthetic complications: no    Last Vitals:  Vitals:   12/27/16 2131 12/28/16 0441  BP: (!) 146/58 (!) 152/68  Pulse: 94 (!) 110  Resp: 18 18  Temp: 36.8 C 37.8 C    Last Pain:  Vitals:   12/28/16 1310  TempSrc:   PainSc: [place]

## 2017-01-05 ENCOUNTER — Ambulatory Visit
Admission: RE | Admit: 2017-01-05 | Discharge: 2017-01-05 | Disposition: A | Discharge status: Home / Self Care | Payer: Medicare HMO | Source: Ambulatory Visit | Attending: Radiation Oncology | Admitting: Radiation Oncology

## 2017-01-05 DIAGNOSIS — C7951 Secondary malignant neoplasm of bone: Secondary | ICD-10-CM | POA: Diagnosis not present

## 2017-01-05 DIAGNOSIS — C50919 Malignant neoplasm of unspecified site of unspecified female breast: Secondary | ICD-10-CM

## 2017-01-05 DIAGNOSIS — Z510 Encounter for antineoplastic radiation therapy: Secondary | ICD-10-CM | POA: Diagnosis not present

## 2017-01-05 NOTE — Progress Notes (Signed)
  Radiation Oncology         [phone] ________________________________  Name: Taylor Brown MRN: [id number]  Date: 01/05/2017  DOB: 02-17-1951  SIMULATION AND TREATMENT PLANNING NOTE    ICD-10-CM   1. Breast cancer metastasized to bone, unspecified laterality ([place]) C50.919    C79.51     DIAGNOSIS:  66 y.o. 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  Identity was confirmed.  All relevant records and images related to the planned course of therapy were reviewed.  The patient freely provided informed written consent to proceed with treatment after reviewing the details related to the planned course of therapy. The consent form was witnessed and verified by the simulation staff.  Then, the patient was set-up in a stable reproducible  supine position for radiation therapy.  CT images were obtained.  Surface markings were placed.  The CT images were loaded into the planning software.  Then the target and avoidance structures were contoured.  Treatment planning then occurred.  The radiation prescription was entered and confirmed.  Then, I designed and supervised the construction of a total of multiple medically necessary complex treatment devices.  I have requested : 3D Simulation  I have requested a DVH of the following structures: lt kidney, rt kidney, and targets.   PLAN:  The patient will receive 30 Gy in 10 fractions to the right humerus and T3 and L3 vertebral bodies. ________________________________  Sheral Apley Tammi Klippel, M.D.  This document serves as a record of services personally performed by Tyler Pita, MD. It was created on his behalf by Darcus Austin, a trained medical scribe. The creation of this record is based on the scribe's personal observations and the provider's statements to them. This document has been checked and approved by the  attending provider.

## 2017-01-09 ENCOUNTER — Inpatient Hospital Stay (HOSPITAL_COMMUNITY)
Admission: EM | Admit: 2017-01-09 | Discharge: 2017-01-12 | DRG: 871 | Disposition: A | Discharge status: Skilled Nursing Facility | Payer: Medicare HMO | Attending: Internal Medicine | Admitting: Internal Medicine

## 2017-01-09 ENCOUNTER — Ambulatory Visit: Admission: RE | Admit: 2017-01-09 | Payer: Medicare HMO | Source: Ambulatory Visit | Admitting: Radiation Oncology

## 2017-01-09 ENCOUNTER — Emergency Department (HOSPITAL_COMMUNITY): Payer: Medicare HMO

## 2017-01-09 ENCOUNTER — Encounter (HOSPITAL_COMMUNITY): Payer: Self-pay | Admitting: Emergency Medicine

## 2017-01-09 DIAGNOSIS — J441 Chronic obstructive pulmonary disease with (acute) exacerbation: Secondary | ICD-10-CM | POA: Diagnosis present

## 2017-01-09 DIAGNOSIS — S42309A Unspecified fracture of shaft of humerus, unspecified arm, initial encounter for closed fracture: Secondary | ICD-10-CM | POA: Diagnosis not present

## 2017-01-09 DIAGNOSIS — M84421A Pathological fracture, right humerus, initial encounter for fracture: Secondary | ICD-10-CM | POA: Diagnosis present

## 2017-01-09 DIAGNOSIS — Z79899 Other long term (current) drug therapy: Secondary | ICD-10-CM | POA: Diagnosis not present

## 2017-01-09 DIAGNOSIS — A419 Sepsis, unspecified organism: Secondary | ICD-10-CM | POA: Diagnosis not present

## 2017-01-09 DIAGNOSIS — M6281 Muscle weakness (generalized): Secondary | ICD-10-CM | POA: Diagnosis not present

## 2017-01-09 DIAGNOSIS — F1010 Alcohol abuse, uncomplicated: Secondary | ICD-10-CM | POA: Diagnosis not present

## 2017-01-09 DIAGNOSIS — E876 Hypokalemia: Secondary | ICD-10-CM | POA: Diagnosis present

## 2017-01-09 DIAGNOSIS — J449 Chronic obstructive pulmonary disease, unspecified: Secondary | ICD-10-CM | POA: Diagnosis not present

## 2017-01-09 DIAGNOSIS — C50919 Malignant neoplasm of unspecified site of unspecified female breast: Secondary | ICD-10-CM | POA: Diagnosis not present

## 2017-01-09 DIAGNOSIS — K219 Gastro-esophageal reflux disease without esophagitis: Secondary | ICD-10-CM | POA: Diagnosis not present

## 2017-01-09 DIAGNOSIS — Y95 Nosocomial condition: Secondary | ICD-10-CM | POA: Diagnosis present

## 2017-01-09 DIAGNOSIS — J440 Chronic obstructive pulmonary disease with acute lower respiratory infection: Secondary | ICD-10-CM | POA: Diagnosis present

## 2017-01-09 DIAGNOSIS — J80 Acute respiratory distress syndrome: Secondary | ICD-10-CM | POA: Diagnosis not present

## 2017-01-09 DIAGNOSIS — Z792 Long term (current) use of antibiotics: Secondary | ICD-10-CM

## 2017-01-09 DIAGNOSIS — J189 Pneumonia, unspecified organism: Secondary | ICD-10-CM | POA: Diagnosis not present

## 2017-01-09 DIAGNOSIS — C7951 Secondary malignant neoplasm of bone: Secondary | ICD-10-CM | POA: Diagnosis present

## 2017-01-09 DIAGNOSIS — J9600 Acute respiratory failure, unspecified whether with hypoxia or hypercapnia: Secondary | ICD-10-CM | POA: Diagnosis not present

## 2017-01-09 DIAGNOSIS — R69 Illness, unspecified: Secondary | ICD-10-CM | POA: Diagnosis not present

## 2017-01-09 DIAGNOSIS — C787 Secondary malignant neoplasm of liver and intrahepatic bile duct: Secondary | ICD-10-CM | POA: Diagnosis present

## 2017-01-09 DIAGNOSIS — F1020 Alcohol dependence, uncomplicated: Secondary | ICD-10-CM | POA: Diagnosis present

## 2017-01-09 DIAGNOSIS — R0602 Shortness of breath: Secondary | ICD-10-CM | POA: Diagnosis not present

## 2017-01-09 DIAGNOSIS — R0603 Acute respiratory distress: Secondary | ICD-10-CM

## 2017-01-09 DIAGNOSIS — G4700 Insomnia, unspecified: Secondary | ICD-10-CM | POA: Diagnosis not present

## 2017-01-09 DIAGNOSIS — M84429D Pathological fracture, unspecified humerus, subsequent encounter for fracture with routine healing: Secondary | ICD-10-CM | POA: Diagnosis not present

## 2017-01-09 DIAGNOSIS — J9601 Acute respiratory failure with hypoxia: Secondary | ICD-10-CM

## 2017-01-09 DIAGNOSIS — F1029 Alcohol dependence with unspecified alcohol-induced disorder: Secondary | ICD-10-CM | POA: Diagnosis not present

## 2017-01-09 DIAGNOSIS — Z7952 Long term (current) use of systemic steroids: Secondary | ICD-10-CM | POA: Diagnosis not present

## 2017-01-09 DIAGNOSIS — I10 Essential (primary) hypertension: Secondary | ICD-10-CM | POA: Diagnosis present

## 2017-01-09 HISTORY — DX: Acute respiratory distress: R06.03

## 2017-01-09 LAB — CBC WITH DIFFERENTIAL/PLATELET
BASOS ABS: 0 10*3/uL (ref 0.0–0.1)
Basophils Relative: 0 %
EOS PCT: 0 %
Eosinophils Absolute: 0 10*3/uL (ref 0.0–0.7)
HEMATOCRIT: 39.8 % (ref 36.0–46.0)
Hemoglobin: 13.1 g/dL (ref 12.0–15.0)
LYMPHS PCT: 10 %
Lymphs Abs: 1.5 10*3/uL (ref 0.7–4.0)
MCH: 33.7 pg (ref 26.0–34.0)
MCHC: 32.9 g/dL (ref 30.0–36.0)
MCV: 102.3 fL — AB (ref 78.0–100.0)
Monocytes Absolute: 1.6 10*3/uL — ABNORMAL HIGH (ref 0.1–1.0)
Monocytes Relative: 12 %
NEUTROS ABS: 10.9 10*3/uL — AB (ref 1.7–7.7)
Neutrophils Relative %: 78 %
PLATELETS: 302 10*3/uL (ref 150–400)
RBC: 3.89 MIL/uL (ref 3.87–5.11)
RDW: 15 % (ref 11.5–15.5)
WBC: 14 10*3/uL — AB (ref 4.0–10.5)

## 2017-01-09 LAB — COMPREHENSIVE METABOLIC PANEL
ALT: 25 U/L (ref 14–54)
ANION GAP: 11 (ref 5–15)
AST: 20 U/L (ref 15–41)
Albumin: 2.9 g/dL — ABNORMAL LOW (ref 3.5–5.0)
Alkaline Phosphatase: 221 U/L — ABNORMAL HIGH (ref 38–126)
BILIRUBIN TOTAL: 0.8 mg/dL (ref 0.3–1.2)
BUN: 7 mg/dL (ref 6–20)
CO2: 23 mmol/L (ref 22–32)
Calcium: 8 mg/dL — ABNORMAL LOW (ref 8.9–10.3)
Chloride: 100 mmol/L — ABNORMAL LOW (ref 101–111)
Creatinine, Ser: 0.6 mg/dL (ref 0.44–1.00)
GFR calc Af Amer: >60 mL/min (ref >60)
Glucose, Bld: 143 mg/dL — ABNORMAL HIGH (ref 65–99)
POTASSIUM: 3 mmol/L — AB (ref 3.5–5.1)
Sodium: 134 mmol/L — ABNORMAL LOW (ref 135–145)
TOTAL PROTEIN: 6.8 g/dL (ref 6.5–8.1)

## 2017-01-09 LAB — URINALYSIS, ROUTINE W REFLEX MICROSCOPIC
BILIRUBIN URINE: NEGATIVE
Glucose, UA: NEGATIVE mg/dL
KETONES UR: NEGATIVE mg/dL
Nitrite: NEGATIVE
PH: 6 (ref 5.0–8.0)
Protein, ur: NEGATIVE mg/dL
RBC / HPF: NONE SEEN RBC/hpf (ref 0–5)
Specific Gravity, Urine: 1.005 (ref 1.005–1.030)

## 2017-01-09 LAB — I-STAT ARTERIAL BLOOD GAS, ED
Acid-base deficit: 5 mmol/L — ABNORMAL HIGH (ref 0.0–2.0)
BICARBONATE: 19.6 mmol/L — AB (ref 20.0–28.0)
O2 Saturation: 99 %
PCO2 ART: 37.3 mmHg (ref 32.0–48.0)
PH ART: 7.333 — AB (ref 7.350–7.450)
PO2 ART: 128 mmHg — AB (ref 83.0–108.0)
Patient temperature: 100.7
TCO2: 21 mmol/L (ref 0–100)

## 2017-01-09 LAB — STREP PNEUMONIAE URINARY ANTIGEN: STREP PNEUMO URINARY ANTIGEN: NEGATIVE

## 2017-01-09 LAB — I-STAT CG4 LACTIC ACID, ED
Lactic Acid, Venous: 1.04 mmol/L (ref 0.5–1.9)
Lactic Acid, Venous: 1.23 mmol/L (ref 0.5–1.9)

## 2017-01-09 LAB — HIV ANTIBODY (ROUTINE TESTING W REFLEX): HIV SCREEN 4TH GENERATION: NONREACTIVE

## 2017-01-09 MED ORDER — VITAMIN B-1 100 MG PO TABS
100.0000 mg | ORAL_TABLET | Freq: Every day | ORAL | Status: DC
  Administered 2017-01-09 – 2017-01-12 (×4): 100 mg via ORAL
  Filled 2017-01-09 (×4): qty 1

## 2017-01-09 MED ORDER — EXEMESTANE 25 MG PO TABS
25.0000 mg | ORAL_TABLET | Freq: Every day | ORAL | Status: DC
  Administered 2017-01-09 – 2017-01-12 (×4): 25 mg via ORAL
  Filled 2017-01-09 (×4): qty 1

## 2017-01-09 MED ORDER — POTASSIUM CHLORIDE CRYS ER 20 MEQ PO TBCR
40.0000 meq | EXTENDED_RELEASE_TABLET | Freq: Once | ORAL | Status: AC
  Administered 2017-01-09: 40 meq via ORAL
  Filled 2017-01-09: qty 2

## 2017-01-09 MED ORDER — BISACODYL 10 MG RE SUPP: 10.0000 mg | Freq: Every day | RECTAL | Status: DC | PRN

## 2017-01-09 MED ORDER — POTASSIUM CHLORIDE IN NACL 20-0.9 MEQ/L-% IV SOLN
INTRAVENOUS | Status: DC
  Administered 2017-01-09 – 2017-01-11 (×2): via INTRAVENOUS
  Filled 2017-01-09 (×3): qty 1000

## 2017-01-09 MED ORDER — METHOCARBAMOL 500 MG PO TABS: 500.0000 mg | ORAL_TABLET | Freq: Four times a day (QID) | ORAL | Status: DC | PRN

## 2017-01-09 MED ORDER — IPRATROPIUM-ALBUTEROL 0.5-2.5 (3) MG/3ML IN SOLN
3.0000 mL | Freq: Once | RESPIRATORY_TRACT | Status: AC
  Administered 2017-01-09: 3 mL via RESPIRATORY_TRACT
  Filled 2017-01-09: qty 3

## 2017-01-09 MED ORDER — ONDANSETRON HCL 4 MG/2ML IJ SOLN: 4.0000 mg | Freq: Four times a day (QID) | INTRAMUSCULAR | Status: DC | PRN

## 2017-01-09 MED ORDER — PANTOPRAZOLE SODIUM 40 MG PO TBEC
40.0000 mg | DELAYED_RELEASE_TABLET | Freq: Two times a day (BID) | ORAL | Status: DC
  Administered 2017-01-09 – 2017-01-12 (×8): 40 mg via ORAL
  Filled 2017-01-09 (×8): qty 1

## 2017-01-09 MED ORDER — VANCOMYCIN HCL IN DEXTROSE 1-5 GM/200ML-% IV SOLN
1000.0000 mg | Freq: Once | INTRAVENOUS | Status: AC
  Administered 2017-01-09: 1000 mg via INTRAVENOUS
  Filled 2017-01-09: qty 200

## 2017-01-09 MED ORDER — PANTOPRAZOLE SODIUM 40 MG PO TBEC: 40.0000 mg | DELAYED_RELEASE_TABLET | Freq: Every day | ORAL | Status: DC

## 2017-01-09 MED ORDER — ALBUTEROL (5 MG/ML) CONTINUOUS INHALATION SOLN
10.0000 mg/h | INHALATION_SOLUTION | Freq: Once | RESPIRATORY_TRACT | Status: AC
  Administered 2017-01-09: 10 mg/h via RESPIRATORY_TRACT
  Filled 2017-01-09: qty 20

## 2017-01-09 MED ORDER — DEXTROSE 5 % IV SOLN
1.0000 g | Freq: Three times a day (TID) | INTRAVENOUS | Status: DC
  Administered 2017-01-09 – 2017-01-12 (×10): 1 g via INTRAVENOUS
  Filled 2017-01-09 (×11): qty 1

## 2017-01-09 MED ORDER — MOMETASONE FURO-FORMOTEROL FUM 200-5 MCG/ACT IN AERO
2.0000 | INHALATION_SPRAY | Freq: Two times a day (BID) | RESPIRATORY_TRACT | Status: DC
Start: 2017-01-09 — End: 2017-01-12
  Administered 2017-01-09 – 2017-01-12 (×6): 2 via RESPIRATORY_TRACT
  Filled 2017-01-09: qty 8.8

## 2017-01-09 MED ORDER — SODIUM CHLORIDE 0.9 % IV BOLUS (SEPSIS)
1000.0000 mL | Freq: Once | INTRAVENOUS | Status: AC
Start: 2017-01-09 — End: 2017-01-09
  Administered 2017-01-09: 1000 mL via INTRAVENOUS

## 2017-01-09 MED ORDER — DEXTROSE 5 % IV SOLN
2.0000 g | Freq: Once | INTRAVENOUS | Status: AC
  Administered 2017-01-09: 2 g via INTRAVENOUS
  Filled 2017-01-09: qty 2

## 2017-01-09 MED ORDER — SODIUM CHLORIDE 0.9 % IV BOLUS (SEPSIS)
1000.0000 mL | Freq: Once | INTRAVENOUS | Status: AC
  Administered 2017-01-09: 1000 mL via INTRAVENOUS

## 2017-01-09 MED ORDER — FOLIC ACID 5 MG/ML IJ SOLN
1.0000 mg | Freq: Every day | INTRAMUSCULAR | Status: DC
  Filled 2017-01-09: qty 0.2

## 2017-01-09 MED ORDER — THIAMINE HCL 100 MG/ML IJ SOLN
Freq: Once | INTRAVENOUS | Status: AC
  Administered 2017-01-09: 08:00:00 via INTRAVENOUS
  Filled 2017-01-09: qty 1000

## 2017-01-09 MED ORDER — PREDNISONE 20 MG PO TABS
50.0000 mg | ORAL_TABLET | Freq: Every day | ORAL | Status: DC
  Administered 2017-01-10 – 2017-01-11 (×2): 50 mg via ORAL
  Filled 2017-01-09 (×2): qty 2

## 2017-01-09 MED ORDER — VANCOMYCIN HCL IN DEXTROSE 1-5 GM/200ML-% IV SOLN
1000.0000 mg | Freq: Two times a day (BID) | INTRAVENOUS | Status: DC
  Administered 2017-01-09 – 2017-01-10 (×2): 1000 mg via INTRAVENOUS
  Filled 2017-01-09 (×3): qty 200

## 2017-01-09 MED ORDER — FOLIC ACID 1 MG PO TABS
1.0000 mg | ORAL_TABLET | Freq: Every day | ORAL | Status: DC
  Administered 2017-01-09 – 2017-01-12 (×4): 1 mg via ORAL
  Filled 2017-01-09 (×4): qty 1

## 2017-01-09 MED ORDER — POLYETHYLENE GLYCOL 3350 17 G PO PACK
17.0000 g | PACK | Freq: Every day | ORAL | Status: DC
  Administered 2017-01-09 – 2017-01-12 (×2): 17 g via ORAL
  Filled 2017-01-09 (×2): qty 1

## 2017-01-09 MED ORDER — DOCUSATE SODIUM 100 MG PO CAPS
100.0000 mg | ORAL_CAPSULE | Freq: Two times a day (BID) | ORAL | Status: DC
  Administered 2017-01-09 – 2017-01-12 (×4): 100 mg via ORAL
  Filled 2017-01-09 (×5): qty 1

## 2017-01-09 MED ORDER — ACETAMINOPHEN 325 MG PO TABS
650.0000 mg | ORAL_TABLET | Freq: Four times a day (QID) | ORAL | Status: DC | PRN
  Administered 2017-01-09 – 2017-01-12 (×4): 650 mg via ORAL
  Filled 2017-01-09 (×6): qty 2

## 2017-01-09 MED ORDER — OXYCODONE HCL 5 MG PO TABS
5.0000 mg | ORAL_TABLET | Freq: Four times a day (QID) | ORAL | Status: DC | PRN
  Administered 2017-01-09 – 2017-01-12 (×7): 5 mg via ORAL
  Filled 2017-01-09 (×7): qty 1

## 2017-01-09 MED ORDER — SENNA 8.6 MG PO TABS
1.0000 | ORAL_TABLET | Freq: Two times a day (BID) | ORAL | Status: DC
  Administered 2017-01-09 – 2017-01-12 (×5): 8.6 mg via ORAL
  Filled 2017-01-09 (×5): qty 1

## 2017-01-09 MED ORDER — ACETAMINOPHEN 325 MG PO TABS
650.0000 mg | ORAL_TABLET | Freq: Once | ORAL | Status: AC
Start: 2017-01-09 — End: 2017-01-09
  Administered 2017-01-09: 650 mg via ORAL
  Filled 2017-01-09: qty 2

## 2017-01-09 MED ORDER — LORATADINE 10 MG PO TABS
10.0000 mg | ORAL_TABLET | Freq: Every day | ORAL | Status: DC | PRN
  Administered 2017-01-12: 10 mg via ORAL
  Filled 2017-01-09: qty 1

## 2017-01-09 MED ORDER — ENOXAPARIN SODIUM 40 MG/0.4ML ~~LOC~~ SOLN
40.0000 mg | SUBCUTANEOUS | Status: DC
  Administered 2017-01-09 – 2017-01-12 (×4): 40 mg via SUBCUTANEOUS
  Filled 2017-01-09 (×4): qty 0.4

## 2017-01-09 MED ORDER — THIAMINE HCL 100 MG/ML IJ SOLN
100.0000 mg | Freq: Every day | INTRAMUSCULAR | Status: DC
  Administered 2017-01-09: 100 mg via INTRAVENOUS
  Filled 2017-01-09: qty 2

## 2017-01-09 MED ORDER — VITAMIN B-12 1000 MCG PO TABS
1000.0000 ug | ORAL_TABLET | Freq: Every day | ORAL | Status: DC
  Administered 2017-01-09 – 2017-01-12 (×4): 1000 ug via ORAL
  Filled 2017-01-09 (×5): qty 1

## 2017-01-09 MED ORDER — LORAZEPAM 2 MG/ML IJ SOLN: 2.0000 mg | INTRAMUSCULAR | Status: DC | PRN

## 2017-01-09 MED ORDER — POTASSIUM CHLORIDE 10 MEQ/50ML IV SOLN
10.0000 meq | INTRAVENOUS | Status: DC | PRN
  Administered 2017-01-09: 10 meq via INTRAVENOUS
  Filled 2017-01-09: qty 50

## 2017-01-09 MED ORDER — METHYLPREDNISOLONE SODIUM SUCC 125 MG IJ SOLR
125.0000 mg | Freq: Once | INTRAMUSCULAR | Status: AC
  Administered 2017-01-09: 125 mg via INTRAVENOUS
  Filled 2017-01-09: qty 2

## 2017-01-09 NOTE — ED Notes (Signed)
Attempted to call report

## 2017-01-09 NOTE — ED Triage Notes (Signed)
Pt arrived via ED via EMS from [facility]. C/o SOB for 2 weeks. New onset fever yesterday. Recent right humerus surgery.

## 2017-01-09 NOTE — Clinical Social Work Note (Signed)
Clinical Social Work Assessment  Patient Details  Name: Taylor Brown MRN: [id number] Date of Birth: Dec 06, 1950  Date of referral:  01/09/17               Reason for consult:  Discharge Planning, Facility Placement (from [facility] )                Permission sought to share information with:  Family Supports, Chartered certified accountant granted to share information::  No (patient on breathing mask, son at bedside)  Name::     Sales executive::  SNFs  Relationship::  son  Contact Information:     Housing/Transportation Living arrangements for the past 2 months:  [place], [place] (lived at home before recent admission to SNF) Source of Information:  Adult Children Patient Interpreter Needed:  None Criminal Activity/Legal Involvement Pertinent to Current Situation/Hospitalization:  No - Comment as needed Significant Relationships:  Adult Children Lives with:  Self Do you feel safe going back to the place where you live?  No Need for family participation in patient care:  No (Coment)  Care giving concerns: Patient was living at home before recent hospital admission and subsequent SNF admission. Patient on breathing mask during assessment; son at bedside.   Social Worker assessment / plan: CSW met with patient and son at bedside. Patient was on breathing mask during assessment and unable to answer questions. Patient's son, Tomasita Crumble, reported good and bad experience at [facility], SNF where patient was for about two weeks prior to hospital admission. Son indicated it will be patient's preference whether she returns there. CSW will follow up with patient when off breathing mask and determine patient preference. CSW will continue to follow for disposition needs.   Employment status:  Retired Forensic scientist:  Administrator) PT Recommendations:  Not assessed at this time [place] / Referral to community resources:  [place]  Patient/Family's Response to care: Patient on breathing mask. Son with appropriate response to and appreciative of care.  Patient/Family's Understanding of and Emotional Response to Diagnosis, Current Treatment, and Prognosis: Patient admitted this morning through ED. Son with understanding of patient's condition and with appropriate response.  Emotional Assessment Appearance:  Appears stated age Attitude/Demeanor/Rapport:  Unable to Assess (patient on breathing mask) Affect (typically observed):  Unable to Assess (patient on breathing mask) Orientation:  Oriented to Self, Oriented to Place, Oriented to  Time, Oriented to Situation Alcohol / Substance use:  Not Applicable Psych involvement (Current and /or in the community):  No (Comment)  Discharge Needs  Concerns to be addressed:  Discharge Planning Concerns Readmission within the last 30 days:  Yes Current discharge risk:  Dependent with Mobility Barriers to Discharge:  Continued Medical Work up   Estanislado Emms, LCSW 01/09/2017, 11:03 AM

## 2017-01-09 NOTE — ED Provider Notes (Signed)
[facility] DEPT Provider Note   CSN: [id number] Arrival date & time: 01/09/17  0204     History   Chief Complaint Chief Complaint  Patient presents with  . Fever  . Shortness of Breath    HPI Taylor Brown is a 66 y.o. female.  The history is provided by the patient and the EMS personnel.  Fever   This is a new problem. The problem occurs constantly. The problem has been gradually worsening. Associated symptoms include cough. Pertinent negatives include no vomiting.  Shortness of Breath  This is a new problem. The problem occurs frequently.The problem has been gradually worsening. Associated symptoms include a fever and cough. Pertinent negatives include no vomiting. Associated symptoms comments: Chest pain with cough .  Patient with h/o breast CA with mets, recent right humerus fracture presents with fever/cough/shortness of breath She reports feeling SOB for "2 weeks" but apparently fever is new She reports increased cough and CP with cough She used to smoke but quit recently No other acute complaints at this time  Past Medical History:  Diagnosis Date  . 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 Arthritis    osteo  . Cancer ([place])   . GERD (gastroesophageal reflux disease)   . Humerus fracture 12/19/2016   right  . Left breast mass 12/26/2016  . Liver lesion 12/20/2016  . Nicotine dependence 12/20/2016    Patient Active Problem List   Diagnosis Date Noted  . Breast cancer metastasized to bone, unspecified laterality ([place]) 12/28/2016  . Breast cancer metastasized to liver, unspecified laterality ([place]) 12/28/2016  . Left breast mass 12/26/2016  . Acute encephalopathy 12/20/2016  . Alcohol abuse 12/20/2016  . Essential hypertension 12/20/2016  . Hypokalemia 12/20/2016  . 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 Right supracondylar humerus fracture 12/19/2016  . Hematochezia 12/17/2016  . GI bleed 12/17/2016  .  Alcohol dependence ([place]) 05/13/2012    Past Surgical History:  Procedure Laterality Date  . ANKLE FRACTURE SURGERY  05/2012  . APPENDECTOMY    . ESOPHAGOGASTRODUODENOSCOPY N/A 12/18/2016   Procedure: ESOPHAGOGASTRODUODENOSCOPY (EGD);  Surgeon: Wilford Corner, MD;  Location: [facility] ENDOSCOPY;  Service: Endoscopy;  Laterality: N/A;  . ORIF ANKLE FRACTURE  05/12/2012   Procedure: OPEN REDUCTION INTERNAL FIXATION (ORIF) ANKLE FRACTURE;  Surgeon: Wylene Simmer, MD;  Location: [facility];  Service: Orthopedics;  Laterality: Left;  . ORIF HUMERUS FRACTURE Right 12/23/2016   Procedure: OPEN REDUCTION INTERNAL FIXATION (ORIF) DISTAL HUMERUS FRACTURE;  Surgeon: Altamese Clear Creek, MD;  Location: [facility];  Service: Orthopedics;  Laterality: Right;  . ORIF WRIST FRACTURE  05/12/2012   Procedure: OPEN REDUCTION INTERNAL FIXATION (ORIF) WRIST FRACTURE;  Surgeon: Mcarthur Rossetti, MD;  Location: [facility];  Service: Orthopedics;  Laterality: Left;  . TOENAIL TRIMMING Bilateral 12/23/2016   Procedure: TOENAIL TRIMMING;  Surgeon: Altamese [place], MD;  Location: [facility];  Service: Orthopedics;  Laterality: Bilateral;  . TUBAL LIGATION    . WRIST FRACTURE SURGERY  05/2012    OB History    No data available       Home Medications    Prior to Admission medications   Medication Sig Start Date End Date Taking? Authorizing Provider  Abemaciclib (VERZENIO) 100 MG TABS Take 100 mg by mouth 2 (two) times daily. 12/28/16   Volanda Napoleon, MD  amLODipine (NORVASC) 5 MG tablet Take 1 tablet (5 mg total) by mouth daily. 12/20/16   Fritzi Mandes, MD  bisacodyl (  DULCOLAX) 10 MG suppository Place 1 suppository (10 mg total) rectally daily as needed for moderate constipation. 12/27/16   Reyne Dumas, MD  docusate sodium 100 MG CAPS Take 100 mg by mouth 2 (two) times daily. Patient not taking: Reported on 12/17/2016 05/14/12   Wylene Simmer, MD  esomeprazole (NEXIUM) 20 MG capsule Take 20 mg by mouth as needed.    [provider]    exemestane (AROMASIN) 25 MG tablet Take 1 tablet (25 mg total) by mouth daily after breakfast. 12/27/16   Reyne Dumas, MD  feeding supplement (BOOST / RESOURCE BREEZE) LIQD Take 1 Container by mouth 2 (two) times daily between meals. 12/27/16   Reyne Dumas, MD  folic acid (FOLVITE) 1 MG tablet Take 1 tablet (1 mg total) by mouth daily. 12/27/16   Reyne Dumas, MD  loratadine (CLARITIN) 10 MG tablet Take 10 mg by mouth daily as needed. For allergies    [provider]  methocarbamol (ROBAXIN) 500 MG tablet Take 1 tablet (500 mg total) by mouth every 6 (six) hours as needed for muscle spasms. 12/27/16   Reyne Dumas, MD  metoprolol tartrate (LOPRESSOR) 25 MG tablet Take 1 tablet (25 mg total) by mouth 2 (two) times daily. 12/19/16   Fritzi Mandes, MD  Multiple Vitamin (MULTIVITAMIN WITH MINERALS) TABS Take 1 tablet by mouth daily. 05/14/12   Wylene Simmer, MD  oxyCODONE (OXY IR/ROXICODONE) 5 MG immediate release tablet Take 1 tablet (5 mg total) by mouth every 6 (six) hours as needed. 12/28/16   Reyne Dumas, MD  pantoprazole (PROTONIX) 40 MG tablet Take 1 tablet (40 mg total) by mouth 2 (two) times daily before a meal. 12/19/16   Fritzi Mandes, MD  polyethylene glycol (MIRALAX / GLYCOLAX) packet Take 17 g by mouth daily. 12/27/16   Reyne Dumas, MD  potassium chloride (KLOR-CON) 20 MEQ packet Take 20 mEq by mouth daily. 12/27/16   Reyne Dumas, MD  senna (SENOKOT) 8.6 MG TABS Take 1 tablet (8.6 mg total) by mouth 2 (two) times daily. Patient not taking: Reported on 12/17/2016 05/14/12   Wylene Simmer, MD  thiamine 100 MG tablet Take 1 tablet (100 mg total) by mouth daily. 12/27/16   Reyne Dumas, MD  vitamin B-12 1000 MCG tablet Take 1 tablet (1,000 mcg total) by mouth daily. 12/27/16   Reyne Dumas, MD    Family History Family History  Problem Relation Age of Onset  . Hypertension Mother   . Breast cancer Mother        in her 27s    Social History Social History  Substance Use Topics  .  Smoking status: Current Every Day Smoker    Packs/day: 2.00    Years: 30.00    Types: Cigarettes  . Smokeless tobacco: Never Used     Comment: smoking cessation material refused   . Alcohol use Yes     Comment: 6 beers per day     Allergies   Ativan [lorazepam] and No known allergies   Review of Systems Review of Systems  Constitutional: Positive for fever.  Respiratory: Positive for cough and shortness of breath.   Cardiovascular:       Chest pain with cough   Gastrointestinal: Negative for vomiting.  All other systems reviewed and are negative.    Physical Exam Updated Vital Signs BP (!) 111/55 (BP Location: Left Arm)   Pulse (!) 113   Temp (!) 100.7 F (38.2 C) (Rectal)   Resp (!) 30   Ht 1.524 m (  5')   Wt 86.2 kg (190 lb)   SpO2 94%   BMI 37.11 kg/m   Physical Exam  CONSTITUTIONAL: Elderly, mild respiratory distress noted HEAD: Normocephalic/atraumatic EYES: EOMI/PERRL ENMT: Mucous membranes moist NECK: supple no meningeal signs SPINE/BACK:entire spine nontender CV: tachycardic, no loud/harsh murmurs LUNGS: tachypnea, coarse wheezing bilaterally ABDOMEN: soft, nontender, obese GU:no cva tenderness NEURO: Pt is awake/alert/appropriate, moves all extremitiesx4.  EXTREMITIES: well healing incision to right UE, no erythema/drainage noted.  Chronic edema/rash to LE SKIN: warm, color normal PSYCH: no abnormalities of mood noted, alert and oriented to situation  ED Treatments / Results  Labs (all labs ordered are listed, but only abnormal results are displayed) Labs Reviewed  COMPREHENSIVE METABOLIC PANEL - Abnormal; Notable for the following:       Result Value   Sodium 134 (*)    Potassium 3.0 (*)    Chloride 100 (*)    Glucose, Bld 143 (*)    Calcium 8.0 (*)    Albumin 2.9 (*)    Alkaline Phosphatase 221 (*)    All other components within normal limits  CBC WITH DIFFERENTIAL/PLATELET - Abnormal; Notable for the following:    WBC 14.0 (*)    MCV  102.3 (*)    Neutro Abs 10.9 (*)    Monocytes Absolute 1.6 (*)    All other components within normal limits  I-STAT ARTERIAL BLOOD GAS, ED - Abnormal; Notable for the following:    pH, Arterial 7.333 (*)    pO2, Arterial 128.0 (*)    Bicarbonate 19.6 (*)    Acid-base deficit 5.0 (*)    All other components within normal limits  CULTURE, BLOOD (ROUTINE X 2)  CULTURE, BLOOD (ROUTINE X 2)  URINALYSIS, ROUTINE W REFLEX MICROSCOPIC  I-STAT CG4 LACTIC ACID, ED  I-STAT CG4 LACTIC ACID, ED    EKG  EKG Interpretation  Date/Time:  Monday January 09 2017 02:09:19 EDT Ventricular Rate:  114 PR Interval:    QRS Duration: 73 QT Interval:  350 QTC Calculation: 482 R Axis:   44 Text Interpretation:  Sinus tachycardia Low voltage, precordial leads Borderline T abnormalities, anterior leads Baseline wander in lead(s) I II III aVR aVL V1 V3 V4 V5 V6 No significant change since last tracing Confirmed by Ripley Fraise [phone]) on 01/09/2017 2:17:59 AM       Radiology Dg Chest Port 1 View  Result Date: 01/09/2017 CLINICAL DATA:  Shortness of breath for 2 weeks.  Fever tonight. EXAM: PORTABLE CHEST 1 VIEW COMPARISON:  CTA 12/24/2016 FINDINGS: Low lung volumes. No consolidation to suggest pneumonia. Heart is normal in size. No pleural effusion or pneumothorax. Left lateral rib fracture again seen. IMPRESSION: Low lung volumes.  No evidence of acute abnormality. Electronically Signed   By: Jeb Levering M.D.   On: 01/09/2017 02:48    Procedures Procedures  CRITICAL CARE Performed by: Sharyon Cable Total critical care time: 35 minutes Critical care time was exclusive of separately billable procedures and treating other patients. Critical care was necessary to treat or prevent imminent or life-threatening deterioration. Critical care was time spent personally by me on the following activities: development of treatment plan with patient and/or surrogate as well as nursing, discussions with  consultants, evaluation of patient's response to treatment, examination of patient, obtaining history from patient or surrogate, ordering and performing treatments and interventions, ordering and review of laboratory studies, ordering and review of radiographic studies, pulse oximetry and re-evaluation of patient's condition. PATIENT WITH ACUTE RESPIRATORY FAILURE REQUIRING CONTINUOUS  ALBUTEROL NEBS AND BIPAP AND ADMISSION   Medications Ordered in ED Medications  ceFEPIme (MAXIPIME) 1 g in dextrose 5 % 50 mL IVPB (not administered)  vancomycin (VANCOCIN) IVPB 1000 mg/200 mL premix (not administered)  ipratropium-albuterol (DUONEB) 0.5-2.5 (3) MG/3ML nebulizer solution 3 mL (3 mLs Nebulization Given 01/09/17 0228)  sodium chloride 0.9 % bolus 1,000 mL (0 mLs Intravenous Stopped 01/09/17 0304)    And  sodium chloride 0.9 % bolus 1,000 mL (0 mLs Intravenous Stopped 01/09/17 0400)    And  sodium chloride 0.9 % bolus 1,000 mL (0 mLs Intravenous Stopped 01/09/17 0302)  ceFEPIme (MAXIPIME) 2 g in dextrose 5 % 50 mL IVPB (0 g Intravenous Stopped 01/09/17 0308)  vancomycin (VANCOCIN) IVPB 1000 mg/200 mL premix (0 mg Intravenous Stopped 01/09/17 0336)  acetaminophen (TYLENOL) tablet 650 mg (650 mg Oral Given 01/09/17 0256)  methylPREDNISolone sodium succinate (SOLU-MEDROL) 125 mg/2 mL injection 125 mg (125 mg Intravenous Given 01/09/17 0413)  albuterol (PROVENTIL,VENTOLIN) solution continuous neb (10 mg/hr Nebulization Given 01/09/17 0346)     Initial Impression / Assessment and Plan / ED Course  I have reviewed the triage vital signs and the nursing notes.  Pertinent labs & imaging results that were available during my care of the patient were reviewed by me and considered in my medical decision making (see chart for details).     2:31 AM Pt presents from rehab with fever/cough and increasing SOB Apparently she had CXR earlier in the day that was negative Pt with recent pathologic fx of right humerus, found to  have breast CA with mets She apparently was heavy drinker/smoker prior to the surgery Plan to initiate sepsis workup and monitor closely 3:32 AM Pt with increasing difficulty breathing Wheezing bilaterally Will place on Bipap and admit Suspect infectious etiology that has triggered COPD Exacerbation 4:49 AM Pt improving on bipap No distress noted Will admit 5:42 AM Endorsed to dr gardner for admission Pt is sleeping on bipap but will wake up, overall improved appearance  Final Clinical Impressions(s) / ED Diagnoses   Final diagnoses:  COPD exacerbation ([place])  Acute respiratory failure with hypoxia [facility])    New Prescriptions New Prescriptions   No medications on file     Ripley Fraise, MD 01/09/17 [phone]

## 2017-01-09 NOTE — Progress Notes (Signed)
TEAM 1 - Stepdown/ICU TEAM  Taylor Brown  KKX:[id number] DOB: December 11, 1950 DOA: 01/09/2017 PCP: Patient, [name]    Brief Narrative:  66 y.o. female with history of EtOH abuse, smoking, HTN, and a pathologic fracture of humerous in June 2018 at which time she was found to have metastatic breast CA w/ lesions in the bone, liver, and a few pulmonary nodules.  She returned to the ED c/o SOB of 2 weeks duration, w/ an acute fever.  .  Subjective: Pt is seen for a f/u visit.    Assessment & Plan:  Sepsis due to suspected pneumonia  Acute bronchospastic presumed COPD exacerbation  Hypokalemia  Metastatic breast cancer Being followed by Oncology and Rad Onc as outpt   HTN  Alcohol abuse  DVT prophylaxis: Lovenox Code Status: FULL CODE Family Communication:  Disposition Plan:   Consultants:  none  Procedures: none  Antimicrobials:  Cefepime 7/1 > Vancomycin 7/1 >  Objective: Blood pressure 98/75, pulse 80, temperature 97.5 F (36.4 C), temperature source Axillary, resp. rate 19, height 5' (1.524 m), weight 79.7 kg (175 lb 11.2 oz), SpO2 100 %.  Intake/Output Summary (Last 24 hours) at 01/09/17 1334 Last data filed at 01/09/17 1200  Gross per 24 hour  Intake             3250 ml  Output              250 ml  Net             3000 ml   Filed Weights   01/09/17 0216 01/09/17 0805  Weight: 86.2 kg (190 lb) 79.7 kg (175 lb 11.2 oz)    Examination: Pt was seen for a f/u visit.    CBC:  Recent Labs Lab 01/09/17 0220  WBC 14.0*  NEUTROABS 10.9*  HGB 13.1  HCT 39.8  MCV 102.3*  PLT 169   Basic Metabolic Panel:  Recent Labs Lab 01/09/17 0220  NA 134*  K 3.0*  CL 100*  CO2 23  GLUCOSE 143*  BUN 7  CREATININE 0.60  CALCIUM 8.0*   GFR: Estimated Creatinine Clearance: 64.6 mL/min (by C-G formula based on SCr of 0.6 mg/dL).  Liver Function Tests:  Recent Labs Lab 01/09/17 0220  AST 20  ALT 25  ALKPHOS 221*  BILITOT 0.8  PROT 6.8    ALBUMIN 2.9*    HbA1C: Hgb A1c MFr Bld  Date/Time Value Ref Range Status  12/17/2016 11:53 AM 5.7 (H) 4.8 - 5.6 % Final    Comment:    (NOTE)         Pre-diabetes: 5.7 - 6.4         Diabetes: >6.4         Glycemic control for adults with diabetes: <7.0     Scheduled Meds: . docusate sodium  100 mg Oral BID  . enoxaparin (LOVENOX) injection  40 mg Subcutaneous Q24H  . exemestane  25 mg Oral QPC breakfast  . folic acid  1 mg Oral Daily  . folic acid  1 mg Intravenous Daily  . mometasone-formoterol  2 puff Inhalation BID  . pantoprazole  40 mg Oral BID AC  . polyethylene glycol  17 g Oral Daily  . [START ON 01/10/2017] predniSONE  50 mg Oral Q breakfast  . senna  1 tablet Oral BID  . thiamine  100 mg Intravenous Daily  . cyanocobalamin  1,000 mcg Oral Daily   Continuous Infusions: . ceFEPime (MAXIPIME) IV    .  potassium chloride 10 mEq (01/09/17 1258)  . vancomycin       LOS: 0 days   Time spent: No Charge  Cherene Altes, MD [facility] Office  [phone] Pager - Text Page per Amion as per below:  On-Call/Text Page:      [email or web address]      password [email or web address]  If 7PM-7AM, please contact night-coverage [email or web address] Password [email or web address] 01/09/2017, 1:34 PM

## 2017-01-09 NOTE — Progress Notes (Signed)
Nutrition Brief Note  Patient identified on the Malnutrition Screening Tool (MST) Report. Patient reports weight loss related to fluids.   Wt Readings from Last 15 Encounters:  01/09/17 175 lb 11.2 oz (79.7 kg)  12/18/16 195 lb (88.5 kg)  05/12/12 155 lb 6.8 oz (70.5 kg)    Body mass index is 34.31 kg/m. Patient meets criteria for obesity based on current BMI.   Nutrition-Focused physical exam completed. Findings are no fat depletion, no muscle depletion, and no edema.   Diet just advanced to Regular, patient is hungry and ready to have a meal. Labs and medications reviewed.   No nutrition interventions warranted at this time. If nutrition issues arise, please consult RD.   Molli Barrows, RD, LDN, [place] Pager [phone] After Hours Pager [phone]

## 2017-01-09 NOTE — H&P (Signed)
History and Physical    Taylor Brown [id number] DOB: 02/01/1951 DOA: 01/09/2017  PCP: Patient, [name]  Patient coming from: [facility]  I have personally briefly reviewed patient's old medical records in [facility]  Chief Complaint: SOB  HPI: Taylor Brown is a 66 y.o. female with medical history significant of EtOH abuse, smoking, HTN, and just last month had pathologic fracture of humerous.  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  Initially admitted to [facility] on 6/9, transferred to [facility] for ORIF of fracture.  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  Patient discharged to [facility].  Started on Aromasin.  Patient now presents to the ED for SOB of "2 weeks" duration, but fever is new today.  Has associated cough, symptoms gradually worsening since onset.  Quit smoking recently.  Nothing makes symptoms Brown or worse, symptoms are severe.  ED Course: Patient improved with steroids, breathing treatments, and BIPAP.  T 100.7.  Tachycardic to 110s, WBC 14k.  CXR neg.  Started on cefepime and vanc for presumed HCAP and given 3L NS bolus for sepsis.   Review of Systems: As per HPI otherwise 10 point review of systems negative.   Past Medical History:  Diagnosis Date  . 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 Arthritis    osteo  . Cancer ([place])   . GERD (gastroesophageal reflux disease)   . Humerus fracture 12/19/2016   right  . Left breast mass 12/26/2016  . Liver lesion 12/20/2016  . Nicotine dependence 12/20/2016    Past Surgical History:  Procedure Laterality Date  . ANKLE FRACTURE SURGERY  05/2012  . APPENDECTOMY    . ESOPHAGOGASTRODUODENOSCOPY N/A 12/18/2016   Procedure: ESOPHAGOGASTRODUODENOSCOPY (EGD);  Surgeon: Wilford Corner, MD;  Location: [facility] ENDOSCOPY;  Service: Endoscopy;  Laterality: N/A;  . ORIF ANKLE FRACTURE  05/12/2012   Procedure: OPEN REDUCTION INTERNAL FIXATION (ORIF) ANKLE  FRACTURE;  Surgeon: Wylene Simmer, MD;  Location: [place];  Service: Orthopedics;  Laterality: Left;  . ORIF HUMERUS FRACTURE Right 12/23/2016   Procedure: OPEN REDUCTION INTERNAL FIXATION (ORIF) DISTAL HUMERUS FRACTURE;  Surgeon: Altamese Milledge[place], MD;  Location: [place];  Service: Orthopedics;  Laterality: Right;  . ORIF WRIST FRACTURE  05/12/2012   Procedure: OPEN REDUCTION INTERNAL FIXATION (ORIF) WRIST FRACTURE;  Surgeon: Mcarthur Rossetti, MD;  Location: [place];  Service: Orthopedics;  Laterality: Left;  . TOENAIL TRIMMING Bilateral 12/23/2016   Procedure: TOENAIL TRIMMING;  Surgeon: Altamese [place], MD;  Location: [place];  Service: Orthopedics;  Laterality: Bilateral;  . TUBAL LIGATION    . WRIST FRACTURE SURGERY  05/2012     reports that she has been smoking Cigarettes.  She has a 60.00 pack-year smoking history. She has never used smokeless tobacco. She reports that she drinks alcohol. She reports that she does not use drugs.  Allergies  Allergen Reactions  . Ativan [Lorazepam] Other (See Comments)    Makes confused  . No Known Allergies     Family History  Problem Relation Age of Onset  . Hypertension Mother   . Breast cancer Mother        in her 71s     Prior to Admission medications   Medication Sig Start Date End Date Taking? Authorizing Provider  amLODipine (NORVASC) 5 MG tablet Take 1 tablet (5 mg total) by mouth daily. 12/20/16  Yes Fritzi Mandes, MD  bisacodyl (DULCOLAX) 10  MG suppository Place 1 suppository (10 mg total) rectally daily as needed for moderate constipation. 12/27/16  Yes Reyne Dumas, MD  docusate sodium 100 MG CAPS Take 100 mg by mouth 2 (two) times daily. 05/14/12  Yes Wylene Simmer, MD  esomeprazole (NEXIUM) 20 MG capsule Take 20 mg by mouth daily as needed (for acid reflux).    Yes [provider]  exemestane (AROMASIN) 25 MG tablet Take 1 tablet (25 mg total) by mouth daily after breakfast. 12/27/16  Yes Reyne Dumas, MD  folic acid (FOLVITE) 1 MG  tablet Take 1 tablet (1 mg total) by mouth daily. 12/27/16  Yes Reyne Dumas, MD  loratadine (CLARITIN) 10 MG tablet Take 10 mg by mouth daily as needed for allergies.    Yes [provider]  methocarbamol (ROBAXIN) 500 MG tablet Take 1 tablet (500 mg total) by mouth every 6 (six) hours as needed for muscle spasms. 12/27/16  Yes Reyne Dumas, MD  metoprolol tartrate (LOPRESSOR) 25 MG tablet Take 1 tablet (25 mg total) by mouth 2 (two) times daily. 12/19/16  Yes Fritzi Mandes, MD  Multiple Vitamin (MULTIVITAMIN WITH MINERALS) TABS Take 1 tablet by mouth daily. 05/14/12  Yes Wylene Simmer, MD  oxyCODONE (OXY IR/ROXICODONE) 5 MG immediate release tablet Take 1 tablet (5 mg total) by mouth every 6 (six) hours as needed. Patient taking differently: Take 5 mg by mouth every 6 (six) hours as needed for moderate pain.  12/28/16  Yes Reyne Dumas, MD  pantoprazole (PROTONIX) 40 MG tablet Take 1 tablet (40 mg total) by mouth 2 (two) times daily before a meal. 12/19/16  Yes Fritzi Mandes, MD  polyethylene glycol (MIRALAX / GLYCOLAX) packet Take 17 g by mouth daily. 12/27/16  Yes Reyne Dumas, MD  potassium chloride (KLOR-CON) 20 MEQ packet Take 20 mEq by mouth daily. 12/27/16  Yes Reyne Dumas, MD  senna (SENOKOT) 8.6 MG TABS Take 1 tablet (8.6 mg total) by mouth 2 (two) times daily. 05/14/12  Yes Wylene Simmer, MD  thiamine 100 MG tablet Take 1 tablet (100 mg total) by mouth daily. 12/27/16  Yes Reyne Dumas, MD  vitamin B-12 1000 MCG tablet Take 1 tablet (1,000 mcg total) by mouth daily. 12/27/16  Yes Reyne Dumas, MD    Physical Exam: Vitals:   01/09/17 0400 01/09/17 0415 01/09/17 0430 01/09/17 0445  BP: 139/82 122/61 (!) 128/91 (!) 152/81  Pulse: 99 (!) 101 (!) 103 (!) 110  Resp: (!) 28 (!) 26 (!) 27 (!) 25  Temp:      TempSrc:      SpO2: 99% 98% 100% 100%  Weight:      Height:        Constitutional: NAD, calm, comfortable Eyes: PERRL, lids and conjunctivae normal ENMT: Mucous membranes are  moist. Posterior pharynx clear of any exudate or lesions.Normal dentition.  Neck: normal, supple, no masses, no thyromegaly Respiratory: Diffuse wheezes Cardiovascular: Tachycardic, no murmurs / rubs / gallops. No extremity edema. 2+ pedal pulses. No carotid bruits.  Abdomen: no tenderness, no masses palpated. No hepatosplenomegaly. Bowel sounds positive.  Musculoskeletal: no clubbing / cyanosis. No joint deformity upper and lower extremities. Good ROM, no contractures. Normal muscle tone.  Skin: no rashes, lesions, ulcers. No induration Neurologic: CN 2-12 grossly intact. Sensation intact, DTR normal. Strength 5/5 in all 4.  Does have whole body tremor. Psychiatric: Normal judgment and insight. Alert and oriented x 3. Normal mood.    Labs on Admission: I have personally reviewed following labs and imaging  studies  CBC:  Recent Labs Lab 01/09/17 0220  WBC 14.0*  NEUTROABS 10.9*  HGB 13.1  HCT 39.8  MCV 102.3*  PLT 408   Basic Metabolic Panel:  Recent Labs Lab 01/09/17 0220  NA 134*  K 3.0*  CL 100*  CO2 23  GLUCOSE 143*  BUN 7  CREATININE 0.60  CALCIUM 8.0*   GFR: Estimated Creatinine Clearance: 67.5 mL/min (by C-G formula based on SCr of 0.6 mg/dL). Liver Function Tests:  Recent Labs Lab 01/09/17 0220  AST 20  ALT 25  ALKPHOS 221*  BILITOT 0.8  PROT 6.8  ALBUMIN 2.9*   No results for input(s): LIPASE, AMYLASE in the last 168 hours. No results for input(s): AMMONIA in the last 168 hours. Coagulation Profile: No results for input(s): INR, PROTIME in the last 168 hours. Cardiac Enzymes: No results for input(s): CKTOTAL, CKMB, CKMBINDEX, TROPONINI in the last 168 hours. BNP (last 3 results) No results for input(s): PROBNP in the last 8760 hours. HbA1C: No results for input(s): HGBA1C in the last 72 hours. CBG: No results for input(s): GLUCAP in the last 168 hours. Lipid Profile: No results for input(s): CHOL, HDL, LDLCALC, TRIG, CHOLHDL, LDLDIRECT in the  last 72 hours. Thyroid Function Tests: No results for input(s): TSH, T4TOTAL, FREET4, T3FREE, THYROIDAB in the last 72 hours. Anemia Panel: No results for input(s): VITAMINB12, FOLATE, FERRITIN, TIBC, IRON, RETICCTPCT in the last 72 hours. Urine analysis:    Component Value Date/Time   COLORURINE YELLOW 01/09/2017 0500   APPEARANCEUR HAZY (A) 01/09/2017 0500   LABSPEC 1.005 01/09/2017 0500   PHURINE 6.0 01/09/2017 0500   GLUCOSEU NEGATIVE 01/09/2017 0500   HGBUR SMALL (A) 01/09/2017 0500   BILIRUBINUR NEGATIVE 01/09/2017 0500   KETONESUR NEGATIVE 01/09/2017 0500   PROTEINUR NEGATIVE 01/09/2017 0500   UROBILINOGEN 0.2 05/13/2012 1440   NITRITE NEGATIVE 01/09/2017 0500   LEUKOCYTESUR SMALL (A) 01/09/2017 0500    Radiological Exams on Admission: Dg Chest Port 1 View  Result Date: 01/09/2017 CLINICAL DATA:  Shortness of breath for 2 weeks.  Fever tonight. EXAM: PORTABLE CHEST 1 VIEW COMPARISON:  CTA 12/24/2016 FINDINGS: Low lung volumes. No consolidation to suggest pneumonia. Heart is normal in size. No pleural effusion or pneumothorax. Left lateral rib fracture again seen. IMPRESSION: Low lung volumes.  No evidence of acute abnormality. Electronically Signed   By: Jeb Levering M.D.   On: 01/09/2017 02:48    EKG: Independently reviewed.  Assessment/Plan Principal Problem:   Acute respiratory failure (HCC) Active Problems:   Alcohol dependence ([place])   Breast cancer metastasized to bone, unspecified laterality (HCC)   Breast cancer metastasized to liver, unspecified laterality ([place])   COPD with acute exacerbation ([place])   Sepsis ([place])   HCAP (healthcare-associated pneumonia)    1. Acute respiratory failure - 1. Though no h/o COPD, she does have many year history of smoking, suspect she has bronchitis / COPD exacerbation secondary to underlying PNA given her sepsis. 2. Breathing is improved and now resting comfortably on BIPAP 2. Sepsis, suspected due to underlying PNA  - 1. Will continue cefepime and vanc started in ED 2. PNA pathway 3. Cultures pending 4. IVF: 3L in ED, will put on 125 cc/hr banana bag 3. COPD exacerbation / acute bronchitis - 1. Prednisone daily 2. Adult wheeze protocol 3. Continuous pulse ox 4. EtOH dependence - 1. Says she hasnt been drinking since discharge 2. Never the less, will put her on CIWA just in case as she did have  significant withdrawal symptoms during admission last month according to Dr. Ledell Peoples discharge note. 5. Metastatic BRCA - 1. continue anti-estrogen therapy with Aromasin 2. Will hold off on starting the new Verzenio that she hasnt yet started taking. 3. Pain control with PRN oxy 6. HTN - holding home BP meds in setting of sepsis admission  DVT prophylaxis: Lovenox Code Status: Full Family Communication: No family in room Disposition Plan: back to SNF after admission Consults called: None Admission status: Admit to inpatient - patient with sepsis, respiratory distress, on BIPAP   Tikesha Mort, [place] [facility] Pager [phone]  If 7AM-7PM, please contact day team taking care of patient [email or web address] Password [email or web address]  01/09/2017, 5:50 AM

## 2017-01-09 NOTE — Progress Notes (Signed)
Patient is resting comfortably on 2L [place]. BIPAP is not needed at this time. RT will monitor as needed. 

## 2017-01-09 NOTE — ED Notes (Signed)
ED Provider at bedside. 

## 2017-01-09 NOTE — Progress Notes (Signed)
Received patient to room 3W31 and placed on monitor central cardiac monitoring notified . Pt is on Bipap with tachypnea and accessory muscle noted. Pt oriented to room and call light and instructed not to get out of bed without assistance . Pt's son is at bedside and both were updated on condition.

## 2017-01-09 NOTE — Progress Notes (Signed)
Pharmacy Antibiotic Note Taylor Brown is a 66 y.o. female admitted on 01/09/2017 with concern for sepsis.  Pharmacy has been consulted for Cefepime and vancomycin dosing.  Plan: Vancomycin 1000 IV every 12 hours.  Goal trough 15-20 mcg/mL.  Cefepime 1 gram IV every 8 hours.  Height: 5' (152.4 cm) Weight: 190 lb (86.2 kg) IBW/kg (Calculated) : 45.5  Temp (24hrs), Avg:100.7 F (38.2 C), Min:100.7 F (38.2 C), Max:100.7 F (38.2 C)   Recent Labs Lab 01/09/17 0220 01/09/17 0232  WBC 14.0*  --   CREATININE 0.60  --   LATICACIDVEN  --  1.04    Estimated Creatinine Clearance: 67.5 mL/min (by C-G formula based on SCr of 0.6 mg/dL).    Allergies  Allergen Reactions  . Ativan [Lorazepam] Other (See Comments)    Makes confused  . No Known Allergies     Antimicrobials this admission: 7/2 Cefepime >>  7/2 vancomycin >>   Microbiology results: 7/2 BCx: px  Thank you for allowing pharmacy to be a part of this patient's care.  Vincenza Hews, PharmD, BCPS 01/09/2017, 3:13 AM

## 2017-01-10 ENCOUNTER — Ambulatory Visit: Payer: Medicare HMO

## 2017-01-10 ENCOUNTER — Inpatient Hospital Stay (HOSPITAL_COMMUNITY): Payer: Medicare HMO

## 2017-01-10 ENCOUNTER — Encounter (HOSPITAL_COMMUNITY): Payer: Self-pay | Admitting: General Practice

## 2017-01-10 ENCOUNTER — Telehealth: Payer: Self-pay | Admitting: Radiation Oncology

## 2017-01-10 DIAGNOSIS — C50919 Malignant neoplasm of unspecified site of unspecified female breast: Secondary | ICD-10-CM

## 2017-01-10 DIAGNOSIS — F1010 Alcohol abuse, uncomplicated: Secondary | ICD-10-CM

## 2017-01-10 DIAGNOSIS — J9601 Acute respiratory failure with hypoxia: Secondary | ICD-10-CM

## 2017-01-10 DIAGNOSIS — C7951 Secondary malignant neoplasm of bone: Secondary | ICD-10-CM

## 2017-01-10 DIAGNOSIS — I10 Essential (primary) hypertension: Secondary | ICD-10-CM

## 2017-01-10 DIAGNOSIS — J441 Chronic obstructive pulmonary disease with (acute) exacerbation: Secondary | ICD-10-CM

## 2017-01-10 LAB — COMPREHENSIVE METABOLIC PANEL
ALBUMIN: 2.2 g/dL — AB (ref 3.5–5.0)
ALT: 19 U/L (ref 14–54)
AST: 17 U/L (ref 15–41)
Alkaline Phosphatase: 163 U/L — ABNORMAL HIGH (ref 38–126)
Anion gap: 7 (ref 5–15)
BUN: 7 mg/dL (ref 6–20)
CHLORIDE: 111 mmol/L (ref 101–111)
CO2: 22 mmol/L (ref 22–32)
Calcium: 7 mg/dL — ABNORMAL LOW (ref 8.9–10.3)
Creatinine, Ser: 0.41 mg/dL — ABNORMAL LOW (ref 0.44–1.00)
GFR calc Af Amer: >60 mL/min (ref >60)
GFR calc non Af Amer: >60 mL/min (ref >60)
GLUCOSE: 134 mg/dL — AB (ref 65–99)
POTASSIUM: 3.6 mmol/L (ref 3.5–5.1)
SODIUM: 140 mmol/L (ref 135–145)
Total Bilirubin: 0.4 mg/dL (ref 0.3–1.2)
Total Protein: 5.4 g/dL — ABNORMAL LOW (ref 6.5–8.1)

## 2017-01-10 LAB — CBC
HEMATOCRIT: 32.7 % — AB (ref 36.0–46.0)
Hemoglobin: 10.4 g/dL — ABNORMAL LOW (ref 12.0–15.0)
MCH: 32.7 pg (ref 26.0–34.0)
MCHC: 31.8 g/dL (ref 30.0–36.0)
MCV: 102.8 fL — AB (ref 78.0–100.0)
Platelets: 262 10*3/uL (ref 150–400)
RBC: 3.18 MIL/uL — ABNORMAL LOW (ref 3.87–5.11)
RDW: 14.8 % (ref 11.5–15.5)
WBC: 12.1 10*3/uL — ABNORMAL HIGH (ref 4.0–10.5)

## 2017-01-10 LAB — VANCOMYCIN, TROUGH: Vancomycin Tr: 11 ug/mL — ABNORMAL LOW (ref 15–20)

## 2017-01-10 LAB — GLUCOSE, CAPILLARY: Glucose-Capillary: 183 mg/dL — ABNORMAL HIGH (ref 65–99)

## 2017-01-10 MED ORDER — DM-GUAIFENESIN ER 30-600 MG PO TB12
1.0000 | ORAL_TABLET | Freq: Two times a day (BID) | ORAL | Status: DC
  Administered 2017-01-10 – 2017-01-12 (×4): 1 via ORAL
  Filled 2017-01-10 (×4): qty 1

## 2017-01-10 MED ORDER — IPRATROPIUM-ALBUTEROL 0.5-2.5 (3) MG/3ML IN SOLN
3.0000 mL | Freq: Four times a day (QID) | RESPIRATORY_TRACT | Status: DC
  Administered 2017-01-10: 3 mL via RESPIRATORY_TRACT

## 2017-01-10 MED ORDER — ZOLPIDEM TARTRATE 5 MG PO TABS
5.0000 mg | ORAL_TABLET | Freq: Once | ORAL | Status: AC
  Administered 2017-01-10: 5 mg via ORAL
  Filled 2017-01-10: qty 1

## 2017-01-10 MED ORDER — VANCOMYCIN HCL 10 G IV SOLR
1500.0000 mg | Freq: Two times a day (BID) | INTRAVENOUS | Status: DC
  Administered 2017-01-10 – 2017-01-11 (×2): 1500 mg via INTRAVENOUS
  Filled 2017-01-10 (×2): qty 1500

## 2017-01-10 MED ORDER — IPRATROPIUM-ALBUTEROL 0.5-2.5 (3) MG/3ML IN SOLN
3.0000 mL | Freq: Three times a day (TID) | RESPIRATORY_TRACT | Status: DC
  Administered 2017-01-11 – 2017-01-12 (×5): 3 mL via RESPIRATORY_TRACT
  Filled 2017-01-10 (×5): qty 3

## 2017-01-10 NOTE — Progress Notes (Signed)
PROGRESS NOTE    Taylor Brown  TWS:[id number] DOB: January 15, 1951 DOA: 01/09/2017 PCP: Patient, [name]   Brief Narrative:  66 y.o.WF PMHx EtOH abuse, smoking, HTN, and a pathologic fracture of humerous in June 2018 at which time she was found to have metastatic breast CA w/ lesions in the bone, liver, and a few pulmonary nodules.  She returned to the ED c/o SOB of 2 weeks duration, w/ an acute fever. .   Subjective: 7/3 A/O 4 MAXIMUM TEMPERATURE last 24 hour 38.2C, negative CP positive SOB, negative N/V, negative abdominal pain. Patient states not on home O2   Assessment & Plan:   Principal Problem:   Acute respiratory failure ([place]) Active Problems:   Alcohol dependence ([place])   Breast cancer metastasized to bone, unspecified laterality (HCC)   Breast cancer metastasized to liver, unspecified laterality ([place])   COPD with acute exacerbation (HCC)   Sepsis ([place])   HCAP (healthcare-associated pneumonia)   Sepsis due to suspected pneumonia/HCAP -Patient meets criteria for sepsis Temp > 30 C, HR> 90, RR > 20  -Patient with new onset O2 demand, leukocytosis, continue current antibiotics -Sputum, blood culture, urine culture pending -Continue saline 55ml/hr -Prednisone 50 mg daily -DuoNeb QID -Flutter valve -Mucinex BID -Titrate O2 to maintain SPO2 89 -93%  COPD exacerbation -See sepsis -Ambulatory SPO2 in the a.m. -PT/OT evaluation Patient with metastatic breast cancer, and COPD exacerbation evaluate for SNF vs home health  Hypokalemia -Resolved  Metastatic breast cancer -Being followed by Oncology and Rad Onc as outpt   Essential HTN -Hold BP medication patiently mildly hypotensive  Alcohol abuse -Currently not an issue    DVT prophylaxis: Lovenox Code Status: Full Family Communication: None Disposition Plan: Discharge next 24-48 hr's   Consultants:  None  Procedures/Significant Events:  None   VENTILATOR SETTINGS: None   Cultures 7/2  blood pending 7/2 sputum pending 7/3 strep pneumo/Legionella urine antigen pending 7/3 urine culture pending    Antimicrobials: Anti-infectives    Start     Stop   01/09/17 1600  vancomycin (VANCOCIN) IVPB 1000 mg/200 mL premix         01/09/17 1400  ceFEPIme (MAXIPIME) 1 g in dextrose 5 % 50 mL IVPB         01/09/17 0230  ceFEPIme (MAXIPIME) 2 g in dextrose 5 % 50 mL IVPB     01/09/17 0308   01/09/17 0230  vancomycin (VANCOCIN) IVPB 1000 mg/200 mL premix     01/09/17 0336       Devices     LINES / TUBES:  None    Continuous Infusions: . 0.9 % NaCl with KCl 20 mEq / L 75 mL/hr at 01/09/17 1700  . ceFEPime (MAXIPIME) IV Stopped (01/10/17 0641)  . vancomycin Stopped (01/10/17 0456)     Objective: Vitals:   01/09/17 2300 01/10/17 0002 01/10/17 0600 01/10/17 0700  BP: 101/67 120/61 (!) 97/52 103/69  Pulse: 84 64 75 82  Resp: 17  17 18   Temp:      TempSrc:      SpO2: 97% 97% 98% 94%  Weight:      Height:        Intake/Output Summary (Last 24 hours) at 01/10/17 0853 Last data filed at 01/09/17 2329  Gross per 24 hour  Intake             1140 ml  Output              600 ml  Net  540 ml   Filed Weights   01/09/17 0216 01/09/17 0805  Weight: 190 lb (86.2 kg) 175 lb 11.2 oz (79.7 kg)    Examination:  General: A/O 4, positive Acute respiratory distress Eyes: negative scleral hemorrhage, negative anisocoria, negative icterus ENT: Negative Runny nose, negative gingival bleeding, Neck:  Negative scars, masses, torticollis, lymphadenopathy, JVD Lungs: diffuse poor air movement, positive mild expiratory wheezes, negative crackles Cardiovascular: Regular rate and rhythm without murmur gallop or rub normal S1 and S2 Abdomen: Morbid obese, negative abdominal pain, nondistended, positive soft, bowel sounds, no rebound, no ascites, no appreciable mass Extremities: No significant cyanosis, clubbing, or edema bilateral lower extremities Skin: Negative  rashes, lesions, ulcers Psychiatric:  Negative depression, negative anxiety, negative fatigue, negative mania  Central nervous system:  Cranial nerves II through XII intact, tongue/uvula midline, all extremities muscle strength 5/5, sensation intact throughout, negative dysarthria, negative expressive aphasia, negative receptive aphasia.  .     Data Reviewed: Care during the described time interval was provided by me .  I have reviewed this patient's available data, including medical history, events of note, physical examination, and all test results as part of my evaluation. I have personally reviewed and interpreted all radiology studies.  CBC:  Recent Labs Lab 01/09/17 0220 01/10/17 0318  WBC 14.0* 12.1*  NEUTROABS 10.9*  --   HGB 13.1 10.4*  HCT 39.8 32.7*  MCV 102.3* 102.8*  PLT 302 814   Basic Metabolic Panel:  Recent Labs Lab 01/09/17 0220 01/10/17 0318  NA 134* 140  K 3.0* 3.6  CL 100* 111  CO2 23 22  GLUCOSE 143* 134*  BUN 7 7  CREATININE 0.60 0.41*  CALCIUM 8.0* 7.0*   GFR: Estimated Creatinine Clearance: 64.6 mL/min (A) (by C-G formula based on SCr of 0.41 mg/dL (L)). Liver Function Tests:  Recent Labs Lab 01/09/17 0220 01/10/17 0318  AST 20 17  ALT 25 19  ALKPHOS 221* 163*  BILITOT 0.8 0.4  PROT 6.8 5.4*  ALBUMIN 2.9* 2.2*   No results for input(s): LIPASE, AMYLASE in the last 168 hours. No results for input(s): AMMONIA in the last 168 hours. Coagulation Profile: No results for input(s): INR, PROTIME in the last 168 hours. Cardiac Enzymes: No results for input(s): CKTOTAL, CKMB, CKMBINDEX, TROPONINI in the last 168 hours. BNP (last 3 results) No results for input(s): PROBNP in the last 8760 hours. HbA1C: No results for input(s): HGBA1C in the last 72 hours. CBG: No results for input(s): GLUCAP in the last 168 hours. Lipid Profile: No results for input(s): CHOL, HDL, LDLCALC, TRIG, CHOLHDL, LDLDIRECT in the last 72 hours. Thyroid Function  Tests: No results for input(s): TSH, T4TOTAL, FREET4, T3FREE, THYROIDAB in the last 72 hours. Anemia Panel: No results for input(s): VITAMINB12, FOLATE, FERRITIN, TIBC, IRON, RETICCTPCT in the last 72 hours. Urine analysis:    Component Value Date/Time   COLORURINE YELLOW 01/09/2017 0500   APPEARANCEUR HAZY (A) 01/09/2017 0500   LABSPEC 1.005 01/09/2017 0500   PHURINE 6.0 01/09/2017 0500   GLUCOSEU NEGATIVE 01/09/2017 0500   HGBUR SMALL (A) 01/09/2017 0500   BILIRUBINUR NEGATIVE 01/09/2017 0500   KETONESUR NEGATIVE 01/09/2017 0500   PROTEINUR NEGATIVE 01/09/2017 0500   UROBILINOGEN 0.2 05/13/2012 1440   NITRITE NEGATIVE 01/09/2017 0500   LEUKOCYTESUR SMALL (A) 01/09/2017 0500   Sepsis Labs: @LABRCNTIP (procalcitonin:4,lacticidven:4)  )No results found for this or any previous visit (from the past 240 hour(s)).       Radiology Studies: Dg Chest [facility]  Result Date: 01/10/2017 CLINICAL DATA:  Shortness of breath. EXAM: PORTABLE CHEST 1 VIEW COMPARISON:  01/09/2017 FINDINGS: The cardiac silhouette is upper limits of normal in size. The lungs are hypoinflated with new mild patchy left basilar opacity. Curvilinear opacity in the right perihilar region likely represents subsegmental atelectasis. No sizable pleural effusion or pneumothorax is identified. Multiple left rib fractures are noted with associated callus formation. IMPRESSION: 1. Low lung volumes with patchy left basilar opacity which may reflect atelectasis or developing pneumonia. 2. Subsegmental atelectasis in the right midlung. Electronically Signed   By: Logan Bores M.D.   On: 01/10/2017 08:14   Dg Chest Port 1 View  Result Date: 01/09/2017 CLINICAL DATA:  Shortness of breath for 2 weeks.  Fever tonight. EXAM: PORTABLE CHEST 1 VIEW COMPARISON:  CTA 12/24/2016 FINDINGS: Low lung volumes. No consolidation to suggest pneumonia. Heart is normal in size. No pleural effusion or pneumothorax. Left lateral rib fracture again  seen. IMPRESSION: Low lung volumes.  No evidence of acute abnormality. Electronically Signed   By: Jeb Levering M.D.   On: 01/09/2017 02:48        Scheduled Meds: . docusate sodium  100 mg Oral BID  . enoxaparin (LOVENOX) injection  40 mg Subcutaneous Q24H  . exemestane  25 mg Oral QPC breakfast  . folic acid  1 mg Oral Daily  . mometasone-formoterol  2 puff Inhalation BID  . pantoprazole  40 mg Oral BID AC  . polyethylene glycol  17 g Oral Daily  . predniSONE  50 mg Oral Q breakfast  . senna  1 tablet Oral BID  . thiamine  100 mg Oral Daily  . cyanocobalamin  1,000 mcg Oral Daily   Continuous Infusions: . 0.9 % NaCl with KCl 20 mEq / L 75 mL/hr at 01/09/17 1700  . ceFEPime (MAXIPIME) IV Stopped (01/10/17 0641)  . vancomycin Stopped (01/10/17 0456)     LOS: 1 day    Time spent: 40 minutes    WOODS, Geraldo Docker, MD [facility] Pager [phone]   If 7PM-7AM, please contact night-coverage [email or web address] Password [email or web address] 01/10/2017, 8:53 AM

## 2017-01-10 NOTE — Progress Notes (Signed)
Pharmacy Antibiotic Note Taylor Brown is a 66 y.o. female admitted on 01/09/2017 with concern for sepsis.  Pharmacy has been consulted for Cefepime and vancomycin dosing.   Cefepime/vanc D#2 for sepsis 2/2 suspected PNA. WBC down 12.1, LA negative, now afeb.  Microbiology results still pending. Vanc trough drawn ~1.5 hours early, resulted below goal at 11. Scr reduced to 0.41, eCrCl improved ~65 ml/min.   Plan: Increase Vancomycin 1500 IV every 12 hours.  Goal trough 15-20 mcg/mL.  Cefepime 1 gram IV every 8 hours.  Height: 5' (152.4 cm) Weight: 175 lb 11.2 oz (79.7 kg) IBW/kg (Calculated) : 45.5  Temp (24hrs), Avg:98.1 F (36.7 C), Min:97.6 F (36.4 C), Max:98.8 F (37.1 C)   Recent Labs Lab 01/09/17 0220 01/09/17 0232 01/09/17 0614 01/10/17 0318 01/10/17 1422  WBC 14.0*  --   --  12.1*  --   CREATININE 0.60  --   --  0.41*  --   LATICACIDVEN  --  1.04 1.23  --   --   VANCOTROUGH  --   --   --   --  11*    Estimated Creatinine Clearance: 64.6 mL/min (A) (by C-G formula based on SCr of 0.41 mg/dL (L)).    Allergies  Allergen Reactions  . Ativan [Lorazepam] Other (See Comments)    Makes confused  . No Known Allergies     Antimicrobials this admission: 7/2 Cefepime >>  7/2 vancomycin >>   Microbiology results: 7/2 BCx: ngtd x1d 6/14 MRSA PCR: negative  Dose adjustments this admission:  7/3 VT (drawn 1.5 hrs early) = 11 * Vanc increased from 1000 mg q12h to 1500 mg q12h  Thank you for allowing pharmacy to be a part of this patient's care.  Carlean Jews, Pharm.D. 7/3/20184:06 PM

## 2017-01-10 NOTE — Progress Notes (Signed)
Patient remains hospitalized thus radiation treatment will be held today. Vicente Males, RT on L2 informed. Will check patient status again tomorrow.

## 2017-01-10 NOTE — Care Management Note (Signed)
Case Management Note  Patient Details  Name: Taylor Brown MRN: [id number] Date of Birth: [birth year]  Subjective/Objective:  Pt presented from [facility] for Acute Respiratory Failure-Question Pneumonia/ COPD Exacerbation. On IV Vancomycin and Maxipime. CSW is following for SNF- family wants a new facility.                  Action/Plan: CM will continue to monitor for additional needs.   Expected Discharge Date:                  Expected Discharge Plan:  Skilled Nursing Facility  In-House Referral:  Clinical Social Work  Discharge planning Services  CM Consult  Post Acute Care Choice:  NA Choice offered to:  NA  DME Arranged:  N/A DME Agency:  NA  HH Arranged:  NA HH Agency:  NA  Status of Service:  Completed, signed off  If discussed at [place] of Stay Meetings, dates discussed:    Additional Comments:  Bethena Roys, RN 01/10/2017, 5:12 PM

## 2017-01-10 NOTE — Plan of Care (Signed)
Problem: Respiratory: Goal: Levels of oxygenation will improve Outcome: Progressing O2 sats 94% on 1L/[place]  Problem: Pain Management: Goal: Expressions of feelings of enhanced comfort will increase Outcome: Progressing Oral pain meds effective for pain control.

## 2017-01-10 NOTE — Telephone Encounter (Signed)
Phoned Anderson Malta, patient's daughter. Explained Dr. Tammi Klippel and his team are aware her mother has been hospitalized with pneumonia and COPD exacerbation. Explained the RN will continue to check in on her mother's status daily and move forward with treatment once she is stable or upon discharge. Encouraged her to call with any future needs. Anderson Malta verbalized understanding and expressed appreciation for the call.

## 2017-01-11 LAB — URINE CULTURE: CULTURE: NO GROWTH

## 2017-01-11 LAB — LEGIONELLA PNEUMOPHILA SEROGP 1 UR AG: L. pneumophila Serogp 1 Ur Ag: NEGATIVE

## 2017-01-11 MED ORDER — ZOLPIDEM TARTRATE 5 MG PO TABS
5.0000 mg | ORAL_TABLET | Freq: Once | ORAL | Status: AC
  Administered 2017-01-11: 5 mg via ORAL
  Filled 2017-01-11: qty 1

## 2017-01-11 MED ORDER — PREDNISONE 20 MG PO TABS
20.0000 mg | ORAL_TABLET | Freq: Every day | ORAL | Status: DC
  Administered 2017-01-12: 20 mg via ORAL
  Filled 2017-01-11: qty 1

## 2017-01-11 NOTE — Progress Notes (Signed)
SATURATION QUALIFICATIONS: (This note is used to comply with regulatory documentation for home oxygen)  Patient Saturations on Room Air at Rest = 98%  Patient Saturations on Room Air while Ambulating = 86%  Patient Saturations on  Liters of oxygen while Ambulating = % (Pt back to chair prior to being able to replace O2)  Please briefly explain why patient needs home oxygen: Pt with decr SpO2 with activity on [facility] [phone]

## 2017-01-11 NOTE — Progress Notes (Signed)
Nutrition Brief Note  Wt Readings from Last 15 Encounters:  01/11/17 182 lb 8 oz (82.8 kg)  12/18/16 195 lb (88.5 kg)  05/12/12 155 lb 6.8 oz (70.5 kg)    Body mass index is 35.64 kg/m. Patient meets criteria for class II obesity based on current BMI. Pt reports weight loss due to fluid status.   Current diet order is regular diet, patient is consuming approximately 100% of meals at this time. Pt reports having a good appetite currently and PTA with no other difficulties. Labs and medications reviewed.   No nutrition interventions warranted at this time. If nutrition issues arise, please consult RD.   Corrin Parker, MS, RD, LDN Pager # [phone] After hours/ weekend pager # [phone]

## 2017-01-11 NOTE — Progress Notes (Signed)
[place] TEAM 1 - Stepdown/ICU TEAM  ELIE GRAGERT  HER:[id number] DOB: 04/16/1951 DOA: 01/09/2017 PCP: Patient, [name]    Brief Narrative:  66 y.o. female with history of EtOH abuse, smoking, HTN, and a pathologic fracture of humerous in June 2018 at which time she was found to have metastatic breast CA w/ lesions in the bone, liver, and a few pulmonary nodules.  She returned to the ED c/o SOB of 2 weeks duration, w/ an acute fever.  .  Subjective: The patient states she is feeling much better overall.  She remains somewhat short of breath but nowhere near as badly as when she was admitted.  She denies current chest pain.  She has been working with physical therapy this morning and this has left her feeling quite tired.  Assessment & Plan:  Sepsis due to suspected pneumonia Now afebrile - WBC approaching normal - to complete 7 day course of antibiotic therapy - given recent negative MRSA screening test will discontinue vancomycin  Acute bronchospastic presumed COPD exacerbation Much improved with no wheezing on physical exam today - continue inhaled medication therapy - wean steroids  Hypokalemia Corrected with replacement  Metastatic breast cancer Being followed by Oncology and Rad Onc as outpt   HTN Blood pressure currently recently controlled  Alcohol abuse Has not had a drink since her hospitalization in June  DVT prophylaxis: Lovenox Code Status: FULL CODE Family Communication: No family present at time of exam Disposition Plan: transfer to tele  Consultants:  none  Procedures: none  Antimicrobials:  Cefepime 7/1 > Vancomycin 7/1 > 7/4  Objective: Blood pressure 124/76, pulse 84, temperature 97.7 F (36.5 C), temperature source Axillary, resp. rate 18, height 5' (1.524 m), weight 82.8 kg (182 lb 8 oz), SpO2 99 %.  Intake/Output Summary (Last 24 hours) at 01/11/17 1031 Last data filed at 01/11/17 0700  Gross per 24 hour  Intake           4017.5 ml    Output              100 ml  Net           3917.5 ml   Filed Weights   01/09/17 0216 01/09/17 0805 01/11/17 0424  Weight: 86.2 kg (190 lb) 79.7 kg (175 lb 11.2 oz) 82.8 kg (182 lb 8 oz)    Examination: General: No acute respiratory distress while at rest in chair  Lungs: Poor air movement bilateral bases - no wheezing Cardiovascular: Regular rate and rhythm without murmur  Abdomen: Nontender, nondistended, soft, bowel sounds positive, no rebound, no ascites, no appreciable mass Extremities: No significant edema bilateral lower extremities   CBC:  Recent Labs Lab 01/09/17 0220 01/10/17 0318  WBC 14.0* 12.1*  NEUTROABS 10.9*  --   HGB 13.1 10.4*  HCT 39.8 32.7*  MCV 102.3* 102.8*  PLT 302 856   Basic Metabolic Panel:  Recent Labs Lab 01/09/17 0220 01/10/17 0318  NA 134* 140  K 3.0* 3.6  CL 100* 111  CO2 23 22  GLUCOSE 143* 134*  BUN 7 7  CREATININE 0.60 0.41*  CALCIUM 8.0* 7.0*   GFR: Estimated Creatinine Clearance: 66 mL/min (A) (by C-G formula based on SCr of 0.41 mg/dL (L)).  Liver Function Tests:  Recent Labs Lab 01/09/17 0220 01/10/17 0318  AST 20 17  ALT 25 19  ALKPHOS 221* 163*  BILITOT 0.8 0.4  PROT 6.8 5.4*  ALBUMIN 2.9* 2.2*    HbA1C: Hgb A1c  MFr Bld  Date/Time Value Ref Range Status  12/17/2016 11:53 AM 5.7 (H) 4.8 - 5.6 % Final    Comment:    (NOTE)         Pre-diabetes: 5.7 - 6.4         Diabetes: >6.4         Glycemic control for adults with diabetes: <7.0     Scheduled Meds: . dextromethorphan-guaiFENesin  1 tablet Oral BID  . docusate sodium  100 mg Oral BID  . enoxaparin (LOVENOX) injection  40 mg Subcutaneous Q24H  . exemestane  25 mg Oral QPC breakfast  . folic acid  1 mg Oral Daily  . ipratropium-albuterol  3 mL Nebulization TID  . mometasone-formoterol  2 puff Inhalation BID  . pantoprazole  40 mg Oral BID AC  . polyethylene glycol  17 g Oral Daily  . predniSONE  50 mg Oral Q breakfast  . senna  1 tablet Oral BID   . thiamine  100 mg Oral Daily  . cyanocobalamin  1,000 mcg Oral Daily     LOS: 2 days    Cherene Altes, MD [facility] Office  [phone] Pager - Text Page per Amion as per below:  On-Call/Text Page:      [email or web address]      password [email or web address]  If 7PM-7AM, please contact night-coverage [email or web address] Password [email or web address] 01/11/2017, 10:31 AM

## 2017-01-11 NOTE — NC FL2 (Signed)
[place] LEVEL OF CARE SCREENING TOOL     IDENTIFICATION  Patient Name: Taylor Brown Birthdate: 06/12/1951 Sex: female Admission Date (Current Location): 01/09/2017  [facility] and [place] Number:  Herbalist and Address:  [facility], [place] [address] [place], [place] [address]      Provider Number: [id number]  Attending Physician Name and Address:  Cherene Altes, MD  Relative Name and Phone Number:  Haynes Bast, Daughter, [phone]     Current Level of Care: Hospital Recommended Level of Care: [place] Prior Approval Number:    Date Approved/Denied:   PASRR Number: [id number]  Discharge Plan:      Current Diagnoses: Patient Active Problem List   Diagnosis Date Noted  . COPD with acute exacerbation ([place]) 01/09/2017  . Sepsis ([place]) 01/09/2017  . HCAP (healthcare-associated pneumonia) 01/09/2017  . Acute respiratory failure ([place]) 01/09/2017  . Breast cancer metastasized to bone, unspecified laterality ([place]) 12/28/2016  . Breast cancer metastasized to liver, unspecified laterality ([place]) 12/28/2016  . Left breast mass 12/26/2016  . Acute encephalopathy 12/20/2016  . Alcohol abuse 12/20/2016  . Essential hypertension 12/20/2016  . Hypokalemia 12/20/2016  . 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 Right supracondylar humerus fracture 12/19/2016  . Hematochezia 12/17/2016  . GI bleed 12/17/2016  . Alcohol dependence ([place]) 05/13/2012    Orientation RESPIRATION BLADDER Height & Weight     Self, Time, Situation, Place  O2 (bipap) Incontinent Weight: 182 lb 8 oz (82.8 kg) Height:  5' (152.4 cm)  BEHAVIORAL SYMPTOMS/MOOD NEUROLOGICAL BOWEL NUTRITION STATUS           AMBULATORY STATUS COMMUNICATION OF NEEDS Skin   Extensive Assist Verbally Surgical wounds (incision right arm (compresion wrap), legs (foam dressing))                       Personal Care Assistance Level  of Assistance    Bathing Assistance: Maximum assistance Feeding assistance: Independent Dressing Assistance: Limited assistance     Functional Limitations Info  Sight, Hearing, Speech Sight Info: Adequate Hearing Info: Adequate Speech Info: Adequate    SPECIAL CARE FACTORS FREQUENCY  PT (By licensed PT), OT (By licensed OT)     PT Frequency: 5x/week OT Frequency: 5x/week            Contractures Contractures Info: Not present    Additional Factors Info  Code Status, Allergies Code Status Info: Full Allergies Info: Ativan Lorazepam           Current Medications (01/11/2017):  This is the current hospital active medication list Current Facility-Administered Medications  Medication Dose Route Frequency Provider Last Rate Last Dose  . acetaminophen (TYLENOL) tablet 650 mg  650 mg Oral Q6H PRN Etta Quill, DO   650 mg at 01/11/17 0800  . bisacodyl (DULCOLAX) suppository 10 mg  10 mg Rectal Daily PRN Etta Quill, DO      . ceFEPIme (MAXIPIME) 1 g in dextrose 5 % 50 mL IVPB  1 g Intravenous Q8H Ripley Fraise, MD   Stopped at 01/11/17 [phone]  . dextromethorphan-guaiFENesin (MUCINEX DM) 30-600 MG per 12 hr tablet 1 tablet  1 tablet Oral BID Allie Bossier, MD   1 tablet at 01/11/17 0800  . docusate sodium (COLACE) capsule 100 mg  100 mg Oral BID Jennette Kettle M, DO   100 mg at 01/11/17 0800  . enoxaparin (LOVENOX) injection 40 mg  40 mg Subcutaneous Q24H Jennette Kettle M, DO   40 mg at 01/10/17 1638  . exemestane (AROMASIN) tablet 25 mg  25 mg Oral QPC breakfast Etta Quill, DO   25 mg at 01/11/17 0383  . folic acid (FOLVITE) tablet 1 mg  1 mg Oral Daily Jennette Kettle M, DO   1 mg at 01/11/17 0800  . ipratropium-albuterol (DUONEB) 0.5-2.5 (3) MG/3ML nebulizer solution 3 mL  3 mL Nebulization TID Allie Bossier, MD   3 mL at 01/11/17 1500  . loratadine (CLARITIN) tablet 10 mg  10 mg Oral Daily PRN Etta Quill, DO      . methocarbamol (ROBAXIN) tablet 500 mg   500 mg Oral Q6H PRN Etta Quill, DO      . mometasone-formoterol [facility]) 200-5 MCG/ACT inhaler 2 puff  2 puff Inhalation BID Etta Quill, DO   2 puff at 01/11/17 [phone]  . ondansetron (ZOFRAN) injection 4 mg  4 mg Intravenous Q6H PRN Etta Quill, DO      . oxyCODONE (Oxy IR/ROXICODONE) immediate release tablet 5 mg  5 mg Oral Q6H PRN Etta Quill, DO   5 mg at 01/11/17 2919  . pantoprazole (PROTONIX) EC tablet 40 mg  40 mg Oral BID AC Jennette Kettle M, DO   40 mg at 01/11/17 0801  . polyethylene glycol (MIRALAX / GLYCOLAX) packet 17 g  17 g Oral Daily Etta Quill, DO   17 g at 01/09/17 0948  . [START ON 01/12/2017] predniSONE (DELTASONE) tablet 20 mg  20 mg Oral Q breakfast Joette Catching T, MD      . senna [facility]) tablet 8.6 mg  1 tablet Oral BID Jennette Kettle M, DO   8.6 mg at 01/11/17 0800  . thiamine (VITAMIN B-1) tablet 100 mg  100 mg Oral Daily Joette Catching T, MD   100 mg at 01/11/17 0800  . vitamin B-12 (CYANOCOBALAMIN) tablet 1,000 mcg  1,000 mcg Oral Daily Jennette Kettle M, DO   1,000 mcg at 01/11/17 0800     Discharge Medications: Please see discharge summary for a list of discharge medications.  Relevant Imaging Results:  Relevant Lab Results:   Additional Information SSN: [id number]  Estanislado Emms, LCSW

## 2017-01-11 NOTE — Evaluation (Signed)
Physical Therapy Evaluation Patient Details Name: Taylor Brown MRN: [id number] DOB: 1950-09-25 Today's Date: 01/11/2017   History of Present Illness  Pt adm with sepsis due to probable PNA. PMH - recent rt humeral fx with radial nerve injury, recent metastatic breast CA, arthritis, ORIF Lt ankle, ORIF lt wrist, etoh abuse, HTN, copd  Clinical Impression  Pt admitted with above diagnosis and presents to PT with functional limitations due to deficits listed below (See PT problem list). Pt needs skilled PT to maximize independence and safety to allow discharge to return to a SNF for further rehab. Pt continues to be motivated to work toward independence so she can eventually return home.     Follow Up Recommendations SNF    Equipment Recommendations  Other (comment) (To be determined)    Recommendations for Other Services       Precautions / Restrictions Precautions Precautions: Fall Required Braces or Orthoses: Sling Other Brace/Splint: when up OOB per pt Restrictions Weight Bearing Restrictions: Yes RUE Weight Bearing: Non weight bearing      Mobility  Bed Mobility Overal bed mobility: Needs Assistance Bed Mobility: Supine to Sit     Supine to sit: Min assist;HOB elevated     General bed mobility comments: Assist to elevate trunk into sitting  Transfers Overall transfer level: Needs assistance Equipment used: Rolling walker (2 wheeled);1 person hand held assist Transfers: Sit to/from [facility] Sit to Stand: Min assist Stand pivot transfers: Min assist       General transfer comment: Assist for balance. Cues to not use RUE. Pt performed pivotal steps from bed to bsc to chair  Ambulation/Gait Ambulation/Gait assistance: Min assist Ambulation Distance (Feet): 35 Feet Assistive device: Rolling walker (2 wheeled) (Pt only using LUE on walker) Gait Pattern/deviations: Step-through pattern;Decreased step length - right;Decreased step length -  left Gait velocity: decr Gait velocity interpretation: Below normal speed for age/gender General Gait Details: Unsteady gait requiring min A for balance. Had pt use walker using her LUE only. Pt amb on RA with SpO2 dipping to 86%. Returned to 94% after sitting  Financial trader [place] (Stroke Patients Only)       Balance Overall balance assessment: Needs assistance Sitting-balance support: No upper extremity supported;Feet supported Sitting balance-Leahy Scale: Good     Standing balance support: Single extremity supported Standing balance-Leahy Scale: Poor Standing balance comment: UE support and min guard for static standing                             Pertinent Vitals/Pain      Home Living Family/patient expects to be discharged to:: Skilled nursing facility                      Prior Function Level of Independence: Needs assistance   Gait / Transfers Assistance Needed: At SNF pt performing bed to w/c transfers modified independent. Amb short distances with assist and use of wall rail.           Hand Dominance   Dominant Hand: Right    Extremity/Trunk Assessment   Upper Extremity Assessment Upper Extremity Assessment: Defer to OT evaluation    Lower Extremity Assessment Lower Extremity Assessment: Generalized weakness    Cervical / Trunk Assessment Cervical / Trunk Assessment: Kyphotic  Communication   Communication: No difficulties  Cognition Arousal/Alertness: Awake/alert Behavior  During Therapy: WFL for tasks assessed/performed Overall Cognitive Status: Within Functional Limits for tasks assessed                                        General Comments      Exercises     Assessment/Plan    PT Assessment Patient needs continued PT services  PT Problem List Decreased strength;Decreased activity tolerance;Decreased balance;Decreased mobility;Decreased knowledge of use  of DME       PT Treatment Interventions DME instruction;Gait training;Functional mobility training;Therapeutic activities;Therapeutic exercise;Balance training;Patient/family education    PT Goals (Current goals can be found in the Care Plan section)  Acute Rehab PT Goals Patient Stated Goal: to get better PT Goal Formulation: With patient Time For Goal Achievement: 01/25/17 Potential to Achieve Goals: Good    Frequency Min 2X/week   Barriers to discharge        Co-evaluation               AM-PAC PT "6 Clicks" Daily Activity  Outcome Measure Difficulty turning over in bed (including adjusting bedclothes, sheets and blankets)?: A Little Difficulty moving from lying on back to sitting on the side of the bed? : Total Difficulty sitting down on and standing up from a chair with arms (e.g., wheelchair, bedside commode, etc,.)?: Total Help needed moving to and from a bed to chair (including a wheelchair)?: A Little Help needed walking in hospital room?: A Little Help needed climbing 3-5 steps with a railing? : A Lot 6 Click Score: 13    End of Session Equipment Utilized During Treatment: Gait belt Activity Tolerance: Patient tolerated treatment well Patient left: in chair;with call bell/phone within reach;with chair alarm set Nurse Communication: Mobility status PT Visit Diagnosis: Unsteadiness on feet (R26.81);Muscle weakness (generalized) (M62.81);Other abnormalities of gait and mobility (R26.89)    Time: 1010-1045 PT Time Calculation (min) (ACUTE ONLY): 35 min   Charges:   PT Evaluation $PT Eval Moderate Complexity: 1 Procedure PT Treatments $Gait Training: 8-22 mins   PT G CodesMarland Kitchen        [facility] PT [place] 01/11/2017, 12:32 PM

## 2017-01-11 NOTE — Evaluation (Signed)
Occupational Therapy Evaluation Patient Details Name: Taylor Brown MRN: [id number] DOB: 1951-05-09 Today's Date: 01/11/2017    History of Present Illness Pt adm with sepsis due to probable PNA. PMH - recent rt humeral fx with radial nerve injury, recent metastatic breast CA, arthritis, ORIF Lt ankle, ORIF lt wrist, etoh abuse, HTN, copd   Clinical Impression   Pt admitted with above. She demonstrates the below listed deficits and will benefit from continued OT to maximize safety and independence with BADLs.  Pt presents to OT with generalized weakness, decreased activity tolerance, decreased functional use of Rt UE, impaired balance.  She requires min - max A for ADLs.  Pt has been at SNF since previous admission, and recommend return to SNF for continued therapy to maximize safety and independence with ADLs.  Will follow acutely.       Follow Up Recommendations  SNF    Equipment Recommendations  None recommended by OT    Recommendations for Other Services       Precautions / Restrictions Precautions Precautions: Fall Precaution Comments: No active abduction R shoulder. [place] for passive abduction R shoulder. [place] for active and passive R shoulder flexion. Unrestricted ROM R elbow, forearm, wrist and hand. 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  Required Braces or Orthoses: Sling Other Brace/Splint: when up OOB per pt Restrictions Weight Bearing Restrictions: Yes RUE Weight Bearing: Non weight bearing      Mobility Bed Mobility Overal bed mobility: Needs Assistance Bed Mobility: Supine to Sit     Supine to sit: Min assist;HOB elevated     General bed mobility comments: Pt sitting up in chair   Transfers Overall transfer level: Needs assistance Equipment used: None Transfers: Sit to/from [facility] Sit to Stand: Min assist Stand pivot transfers: Min guard       General transfer comment: assist to move into standing      Balance Overall balance assessment: Needs assistance Sitting-balance support: Feet supported Sitting balance-Leahy Scale: Good     Standing balance support: Single extremity supported;During functional activity Standing balance-Leahy Scale: Poor Standing balance comment: min guard and UE support                            ADL either performed or assessed with clinical judgement   ADL Overall ADL's : Needs assistance/impaired Eating/Feeding: Modified independent;Sitting   Grooming: Wash/dry hands;Wash/dry face;Oral care;Brushing hair;Min guard;Standing   Upper Body Bathing: Minimal assistance;Sitting   Lower Body Bathing: Moderate assistance;Sit to/from stand   Upper Body Dressing : Minimal assistance;Sitting   Lower Body Dressing: Maximal assistance;Sit to/from stand Lower Body Dressing Details (indicate cue type and reason): Pt requires mod A to don Lt sock , max A for Rt  Toilet Transfer: Min guard;Ambulation;Comfort height toilet;Grab bars   Toileting- Clothing Manipulation and Hygiene: Sit to/from stand;Minimal assistance       Functional mobility during ADLs: Minimal assistance General ADL Comments: Pt fatigues with activity      Vision Baseline Vision/History: Wears glasses Wears Glasses: At all times Patient Visual Report: No change from baseline       Perception     Praxis      Pertinent Vitals/Pain Pain Assessment: No/denies pain     Hand Dominance Right   Extremity/Trunk Assessment Upper Extremity Assessment Upper Extremity Assessment: LUE deficits/detail RUE Deficits / Details: Pt is able to independently state precautions.  She requires cues for NWB  over Rt UE as she tends to prop on it when standing.  She demonstrates FF [facility] Actively.  Abduction WFL passively to 90*; elbow AROM WFL with strength 3/5; supination ~45* actively; Wrist extension WFL actively 3/5; composite finger ext ~75% actively.    LUE Coordination: decreased fine  motor;decreased gross motor   Lower Extremity Assessment Lower Extremity Assessment: Defer to PT evaluation   Cervical / Trunk Assessment Cervical / Trunk Assessment: Kyphotic   Communication Communication Communication: No difficulties   Cognition Arousal/Alertness: Awake/alert Behavior During Therapy: WFL for tasks assessed/performed Overall Cognitive Status: Within Functional Limits for tasks assessed                                     General Comments  DOE 2/4 with simple grooming activities standing at sink     Exercises Exercises: General Upper Extremity General Exercises - Upper Extremity Shoulder Flexion: AROM;Right;10 reps;Seated Elbow Flexion: AROM;Right;10 reps;Seated Elbow Extension: AROM;Right;10 reps;Seated Wrist Extension: AROM;Right;10 reps;Seated Composite Extension: AROM;Right;15 reps;Seated   Shoulder Instructions      Home Living Family/patient expects to be discharged to:: Skilled nursing facility                                        Prior Functioning/Environment Level of Independence: Needs assistance  Gait / Transfers Assistance Needed: At SNF pt performing bed to w/c transfers modified independent. Amb short distances with assist and use of wall rail. ADL's / Homemaking Assistance Needed: Pt reports she has been using AE for ADLs, but requires assist for all aspects             OT Problem List: Decreased strength;Decreased range of motion;Decreased activity tolerance;Impaired balance (sitting and/or standing);Decreased coordination;Decreased safety awareness;Decreased knowledge of use of DME or AE;Impaired UE functional use      OT Treatment/Interventions: Self-care/ADL training;Therapeutic exercise;DME and/or AE instruction;Therapeutic activities;Patient/family education;Balance training    OT Goals(Current goals can be found in the care plan section) Acute Rehab OT Goals Patient Stated Goal: To ge stronger   OT Goal Formulation: With patient Time For Goal Achievement: 01/25/17 Potential to Achieve Goals: Good ADL Goals Pt Will Perform Grooming: with supervision;standing Pt Will Perform Upper Body Bathing: with supervision;with set-up;sitting Pt Will Perform Lower Body Bathing: with min assist;with adaptive equipment;sit to/from stand Pt Will Transfer to Toilet: with supervision;ambulating;regular height toilet;bedside commode;grab bars Pt/caregiver will Perform Home Exercise Program: Right Upper extremity;Increased ROM;Increased strength;Independently (will demonstrate full AROM )  OT Frequency: Min 2X/week   Barriers to D/C: Decreased caregiver support          Co-evaluation              AM-PAC PT "6 Clicks" Daily Activity     Outcome Measure Help from another person eating meals?: None Help from another person taking care of personal grooming?: A Little Help from another person toileting, which includes using toliet, bedpan, or urinal?: A Little Help from another person bathing (including washing, rinsing, drying)?: A Lot Help from another person to put on and taking off regular upper body clothing?: A Lot Help from another person to put on and taking off regular lower body clothing?: A Lot 6 Click Score: 16   End of Session Equipment Utilized During Treatment: Gait belt Nurse Communication: Mobility status  Activity Tolerance: Patient tolerated treatment  well Patient left: in chair;with call bell/phone within reach;with chair alarm set;with family/visitor present  OT Visit Diagnosis: Unsteadiness on feet (R26.81)                Time: 1950-9326 OT Time Calculation (min): 23 min Charges:  OT General Charges $OT Visit: 1 Procedure OT Evaluation $OT Eval Moderate Complexity: 1 Procedure OT Treatments $Self Care/Home Management : 8-22 mins G-Codes:     [facility], OTR/L [phone]   Sharyl Panchal M 01/11/2017, 1:18 PM

## 2017-01-12 ENCOUNTER — Ambulatory Visit: Payer: Medicare HMO

## 2017-01-12 DIAGNOSIS — G4700 Insomnia, unspecified: Secondary | ICD-10-CM | POA: Diagnosis not present

## 2017-01-12 DIAGNOSIS — C787 Secondary malignant neoplasm of liver and intrahepatic bile duct: Secondary | ICD-10-CM | POA: Diagnosis not present

## 2017-01-12 DIAGNOSIS — K219 Gastro-esophageal reflux disease without esophagitis: Secondary | ICD-10-CM | POA: Diagnosis not present

## 2017-01-12 DIAGNOSIS — R69 Illness, unspecified: Secondary | ICD-10-CM | POA: Diagnosis not present

## 2017-01-12 DIAGNOSIS — J9600 Acute respiratory failure, unspecified whether with hypoxia or hypercapnia: Secondary | ICD-10-CM | POA: Diagnosis not present

## 2017-01-12 DIAGNOSIS — J441 Chronic obstructive pulmonary disease with (acute) exacerbation: Secondary | ICD-10-CM | POA: Diagnosis not present

## 2017-01-12 DIAGNOSIS — J449 Chronic obstructive pulmonary disease, unspecified: Secondary | ICD-10-CM | POA: Diagnosis not present

## 2017-01-12 DIAGNOSIS — C50912 Malignant neoplasm of unspecified site of left female breast: Secondary | ICD-10-CM | POA: Diagnosis not present

## 2017-01-12 DIAGNOSIS — C50919 Malignant neoplasm of unspecified site of unspecified female breast: Secondary | ICD-10-CM | POA: Diagnosis not present

## 2017-01-12 DIAGNOSIS — Z720 Tobacco use: Secondary | ICD-10-CM | POA: Diagnosis not present

## 2017-01-12 DIAGNOSIS — J80 Acute respiratory distress syndrome: Secondary | ICD-10-CM | POA: Diagnosis not present

## 2017-01-12 DIAGNOSIS — C7800 Secondary malignant neoplasm of unspecified lung: Secondary | ICD-10-CM | POA: Diagnosis not present

## 2017-01-12 DIAGNOSIS — E876 Hypokalemia: Secondary | ICD-10-CM | POA: Diagnosis not present

## 2017-01-12 DIAGNOSIS — J189 Pneumonia, unspecified organism: Secondary | ICD-10-CM | POA: Diagnosis not present

## 2017-01-12 DIAGNOSIS — C7951 Secondary malignant neoplasm of bone: Secondary | ICD-10-CM | POA: Diagnosis not present

## 2017-01-12 DIAGNOSIS — J9601 Acute respiratory failure with hypoxia: Secondary | ICD-10-CM | POA: Diagnosis not present

## 2017-01-12 DIAGNOSIS — M84429D Pathological fracture, unspecified humerus, subsequent encounter for fracture with routine healing: Secondary | ICD-10-CM | POA: Diagnosis not present

## 2017-01-12 DIAGNOSIS — Z510 Encounter for antineoplastic radiation therapy: Secondary | ICD-10-CM | POA: Diagnosis not present

## 2017-01-12 DIAGNOSIS — A419 Sepsis, unspecified organism: Secondary | ICD-10-CM | POA: Diagnosis not present

## 2017-01-12 DIAGNOSIS — S42309A Unspecified fracture of shaft of humerus, unspecified arm, initial encounter for closed fracture: Secondary | ICD-10-CM | POA: Diagnosis not present

## 2017-01-12 DIAGNOSIS — M84422D Pathological fracture, left humerus, subsequent encounter for fracture with routine healing: Secondary | ICD-10-CM | POA: Diagnosis not present

## 2017-01-12 DIAGNOSIS — F1010 Alcohol abuse, uncomplicated: Secondary | ICD-10-CM | POA: Diagnosis not present

## 2017-01-12 DIAGNOSIS — M6281 Muscle weakness (generalized): Secondary | ICD-10-CM | POA: Diagnosis not present

## 2017-01-12 DIAGNOSIS — I10 Essential (primary) hypertension: Secondary | ICD-10-CM | POA: Diagnosis not present

## 2017-01-12 MED ORDER — LEVOFLOXACIN 750 MG PO TABS: 750.0000 mg | ORAL_TABLET | Freq: Every day | ORAL | 0 refills | Status: DC

## 2017-01-12 MED ORDER — PREDNISONE 20 MG PO TABS: 10.0000 mg | ORAL_TABLET | Freq: Every day | ORAL | 0 refills | Status: DC

## 2017-01-12 MED ORDER — LEVOFLOXACIN 750 MG PO TABS
750.0000 mg | ORAL_TABLET | Freq: Every day | ORAL | Status: DC
  Administered 2017-01-12: 750 mg via ORAL
  Filled 2017-01-12: qty 1

## 2017-01-12 MED ORDER — DM-GUAIFENESIN ER 30-600 MG PO TB12: 1.0000 | ORAL_TABLET | Freq: Two times a day (BID) | ORAL | 0 refills | Status: AC

## 2017-01-12 MED ORDER — MOMETASONE FURO-FORMOTEROL FUM 200-5 MCG/ACT IN AERO: 2.0000 | INHALATION_SPRAY | Freq: Two times a day (BID) | RESPIRATORY_TRACT | 0 refills | Status: AC

## 2017-01-12 NOTE — Discharge Summary (Signed)
Physician Discharge Summary  Taylor Brown TLX:[id number] DOB: [birth year] DOA: 01/09/2017  PCP: Patient, [name]  Admit date: 01/09/2017 Discharge date: 01/12/2017  Time spent: 35 minutes  Recommendations for Outpatient Follow-up:  Sepsis due to suspected pneumonia/HCAP -Patient meets criteria for sepsis Temp > 30 C, HR> 90, RR > 20  -Patient with new onset O2 demand, leukocytosis, continue current antibiotics -Sputum, blood culture, urine culture pending -Prednisone 10 mg daily. PCP to titrate down/discontinue -DuoNeb QID -Flutter valve -Mucinex BID -Titrate O2 to maintain SPO2 89 -93% -Complete 7 days antibiotics -Patient requires PCP. Establish care  COPD exacerbation -See sepsis -PT/OT evaluation: Recommends SNF  SATURATION QUALIFICATIONS: (Thisnote is usedto comply with regulatory documentation for home oxygen) Patient Saturations on Room Air at Rest = 98% Patient Saturations on [employer] while Ambulating = 86% Patient Saturations on  Liters of oxygen while Ambulating = %(Pt back to chair prior to being able to replace O2) Please briefly explain why patient needs home oxygen:Pt with decr SpO2 with activity on RA -Will require home 2 L O2 when ambulating   Hypokalemia -Resolved  Metastatic breast cancer -Being followed by Oncology and Rad Onc as outpt  -Schedule follow-up one -two week Dr. Tyler Pita radiation oncology  Essential HTN -Hold BP medication patiently mildly hypotensive  Alcohol abuse -Currently not an issue       Discharge Diagnoses:  Principal Problem:   Acute respiratory failure ([place]) Active Problems:   Alcohol dependence ([place])   Breast cancer metastasized to bone, unspecified laterality ([place])   Breast cancer metastasized to liver, unspecified laterality ([place])   COPD with acute exacerbation ([place])   Sepsis ([place])   HCAP (healthcare-associated pneumonia)   Discharge Condition: Stable  Diet recommendation: Regular  Filed  Weights   01/09/17 0805 01/11/17 0424 01/12/17 0616  Weight: 175 lb 11.2 oz (79.7 kg) 182 lb 8 oz (82.8 kg) 181 lb 3.2 oz (82.2 kg)    History of present illness:  66 y.o.WF PMHx EtOH abuse, smoking, HTN, and a pathologic fracture of humerous in June 2018 at which time she was found to have metastatic breast CA w/ lesions in the bone, liver, and a few pulmonary nodules. She returned to the ED c/o SOB of 2 weeks duration, w/ an acute fever.   During his hospitalization patient was treated for HCAP/acute respiratory failure with hypoxia/COPD exacerbation and has responded well to appropriate medication. Patient is deconditioning and require some rehabilitation.  Culture: 7/2 blood NGTD  7/2 sputum pending 7/3 strep pneumo/Legionella urine antigen negative  7/3 urine culture negative final   Antibiotics Anti-infectives    Start     Stop   01/12/17 1630  levofloxacin (LEVAQUIN) tablet 750 mg         01/10/17 1630  vancomycin (VANCOCIN) 1,500 mg in sodium chloride 0.9 % 500 mL IVPB  Status:  Discontinued     01/11/17 1051   01/09/17 1600  vancomycin (VANCOCIN) IVPB 1000 mg/200 mL premix  Status:  Discontinued     01/10/17 1608   01/09/17 1400  ceFEPIme (MAXIPIME) 1 g in dextrose 5 % 50 mL IVPB  Status:  Discontinued     01/12/17 1618   01/09/17 0230  ceFEPIme (MAXIPIME) 2 g in dextrose 5 % 50 mL IVPB     01/09/17 0308   01/09/17 0230  vancomycin (VANCOCIN) IVPB 1000 mg/200 mL premix     01/09/17 0336       Discharge Exam: Vitals:   01/12/17 0902 01/12/17  1024 01/12/17 1329 01/12/17 1418  BP:    (!) 150/63  Pulse:  97  97  Resp:  (!) 25  (!) 22  Temp:    98.1 F (36.7 C)  TempSrc:    Oral  SpO2: 98% 97% 100% 100%  Weight:      Height:        General: A/O 4, positive Acute respiratory distress Eyes: negative scleral hemorrhage, negative anisocoria, negative icterus ENT: Negative Runny nose, negative gingival bleeding, Neck:  Negative scars, masses, torticollis,  lymphadenopathy, JVD Lungs: diffuse poor air movement, mild expiratory wheezes, negative crackles Cardiovascular: Regular rate and rhythm without murmur gallop or rub normal S1 and S2   Discharge Instructions   Allergies as of 01/12/2017      Reactions   Ativan [lorazepam] Other (See Comments)   Makes confused   No Known Allergies       Medication List    STOP taking these medications   amLODipine 5 MG tablet Commonly known as:  NORVASC   metoprolol tartrate 25 MG tablet Commonly known as:  LOPRESSOR   potassium chloride 20 MEQ packet Commonly known as:  KLOR-CON     TAKE these medications   bisacodyl 10 MG suppository Commonly known as:  DULCOLAX Place 1 suppository (10 mg total) rectally daily as needed for moderate constipation.   cyanocobalamin 1000 MCG tablet Take 1 tablet (1,000 mcg total) by mouth daily.   dextromethorphan-guaiFENesin 30-600 MG 12hr tablet Commonly known as:  MUCINEX DM Take 1 tablet by mouth 2 (two) times daily.   DSS 100 MG Caps Take 100 mg by mouth 2 (two) times daily.   esomeprazole 20 MG capsule Commonly known as:  NEXIUM Take 20 mg by mouth daily as needed (for acid reflux).   exemestane 25 MG tablet Commonly known as:  AROMASIN Take 1 tablet (25 mg total) by mouth daily after breakfast.   folic acid 1 MG tablet Commonly known as:  FOLVITE Take 1 tablet (1 mg total) by mouth daily.   levofloxacin 750 MG tablet Commonly known as:  LEVAQUIN Take 1 tablet (750 mg total) by mouth daily.   loratadine 10 MG tablet Commonly known as:  CLARITIN Take 10 mg by mouth daily as needed for allergies.   methocarbamol 500 MG tablet Commonly known as:  ROBAXIN Take 1 tablet (500 mg total) by mouth every 6 (six) hours as needed for muscle spasms.   mometasone-formoterol 200-5 MCG/ACT Aero Commonly known as:  DULERA Inhale 2 puffs into the lungs 2 (two) times daily.   multivitamin with minerals Tabs tablet Take 1 tablet by mouth  daily.   oxyCODONE 5 MG immediate release tablet Commonly known as:  Oxy IR/ROXICODONE Take 1 tablet (5 mg total) by mouth every 6 (six) hours as needed. What changed:  reasons to take this   pantoprazole 40 MG tablet Commonly known as:  PROTONIX Take 1 tablet (40 mg total) by mouth 2 (two) times daily before a meal.   polyethylene glycol packet Commonly known as:  MIRALAX / GLYCOLAX Take 17 g by mouth daily.   predniSONE 20 MG tablet Commonly known as:  DELTASONE Take 0.5 tablets (10 mg total) by mouth daily with breakfast. Start taking on:  01/13/2017   senna 8.6 MG Tabs tablet Commonly known as:  SENOKOT Take 1 tablet (8.6 mg total) by mouth 2 (two) times daily.   thiamine 100 MG tablet Take 1 tablet (100 mg total) by mouth daily.  Allergies  Allergen Reactions  . Ativan [Lorazepam] Other (See Comments)    Makes confused  . No Known Allergies     Contact information for follow-up providers    Tyler Pita, MD. Schedule an appointment as soon as possible for a visit in 2 week(s).   Specialty:  Radiation Oncology Why:  Schedule follow-up one -two week Dr. Tyler Pita radiation oncology Contact information: [place] [phone]            Contact information for after-discharge care    [facility] Follow up.   Specialty:  [place] information: [address] [place] [phone]                   The results of significant diagnostics from this hospitalization (including imaging, microbiology, ancillary and laboratory) are listed below for reference.    Significant Diagnostic Studies: Dg Forearm Right  Result Date: 12/20/2016 CLINICAL DATA:  Right arm fracture EXAM: RIGHT FOREARM - 2 VIEW COMPARISON:  None. FINDINGS: No fracture or dislocation is seen in the forearm. Distal humeral fracture may be visible on one of the  two views, but is better demonstrated on the prior study. Overlying cast obscures fine osseous detail. IMPRESSION: No fracture or dislocation is seen in the forearm. Known distal humerus fracture is not well visualized. Electronically Signed   By: Julian Hy M.D.   On: 12/20/2016 11:19   Ct Chest W Contrast  Result Date: 12/24/2016 CLINICAL DATA:  Inpatient admitted with pathologic right humerus fracture and left breast mass. Suspected metastatic breast cancer. Staging. EXAM: CT CHEST, ABDOMEN, AND PELVIS WITH CONTRAST TECHNIQUE: Multidetector CT imaging of the chest, abdomen and pelvis was performed following the standard protocol during bolus administration of intravenous contrast. CONTRAST:  124m ISOVUE-300 IOPAMIDOL (ISOVUE-300) INJECTION 61% COMPARISON:  12/17/2016 chest CT. FINDINGS: CT CHEST FINDINGS Cardiovascular: Top-normal heart size. No significant pericardial fluid/thickening. Atherosclerotic nonaneurysmal thoracic aorta. Normal caliber pulmonary arteries. No central pulmonary emboli. Mediastinum/Nodes: No discrete thyroid nodules. Unremarkable esophagus. No pathologically enlarged axillary, mediastinal or hilar lymph nodes. Lungs/Pleura: No pneumothorax. No pleural effusion. Right middle lobe 5 mm (series 4/image 81) and 4 mm lingular (series 4/image 68) solid pulmonary nodules, both stable since 12/17/2016. Subsegmental atelectasis in the dependent lungs bilaterally. Otherwise no acute consolidative airspace disease, lung masses or additional significant pulmonary nodules. Musculoskeletal: Expansile lytic destructive bone lesion at the right third costovertebral junction (series 4/image 20) with mass-effect on the right thoracic spinal canal. Healed deformities of the lateral left seventh and eighth ribs. Partially visualized surgical fixation of the right distal humerus. Marked thoracic spondylosis . Irregular solid 5.3 x 2.8 cm outer left breast mass, extending to the skin (series  3/image 30). CT ABDOMEN PELVIS FINDINGS Hepatobiliary: Diffuse hepatic steatosis. The known liver masses (at least 4) in the liver seen on the 12/17/2016 chest CT study are poorly visualized on the routine phase sequence through the liver on today's scan, probably due to differences in contrast timing. Representative liver masses include a 1.7 x 1.5 cm liver dome lesion (series 3/ image 43), a 1.5 x 1.1 cm segment 4B left liver lobe lesion (series 8/ image 7) and a 0.9 x 0.7 cm segment 3 left liver lobe lesion (series 8/image 8). Normal gallbladder with no radiopaque cholelithiasis. No biliary ductal dilatation. Pancreas: Normal, with no mass or duct dilation. Spleen: Normal size. No mass. Adrenals/Urinary Tract: Suggestion of  an indeterminate 1.2 cm lower left adrenal nodule (series 3/ image 54) with density 875 HU. No additional discrete adrenal nodules. No hydronephrosis. Mild segmental renal cortical scarring in the upper right kidney. No renal mass. Bladder collapsed by indwelling Foley catheter, limiting bladder evaluation. Grossly normal bladder. Stomach/Bowel: Grossly normal stomach. Normal caliber small bowel with no small bowel wall thickening. Appendectomy. Normal large bowel with no diverticulosis, large bowel wall thickening or pericolonic fat stranding. Vascular/Lymphatic: Atherosclerotic nonaneurysmal abdominal aorta. Patent portal, splenic, hepatic and renal veins. No pathologically enlarged lymph nodes in the abdomen or pelvis. Reproductive: Suggestion of abnormal endometrial thickening (approximately 10 mm). No adnexal mass. Other: No pneumoperitoneum, ascites or focal fluid collection. Small fat containing umbilical hernia. Musculoskeletal: No aggressive appearing focal osseous lesions. Mild L3 and L4 superior vertebral compression fractures of indeterminate chronicity. Moderate lumbar spondylosis . IMPRESSION: 1. Irregular solid 5.3 cm upper left breast mass extending to the skin, suspicious for  primary left breast malignancy. Recommend correlation with diagnostic mammographic evaluation. 2. Expansile lytic destructive bone lesion at the right third costovertebral junction with mass-effect on the right thoracic spinal canal, compatible with osseous metastasis. Consider correlation with thoracic spine MRI without and with IV contrast to evaluate for possible thoracic cord compression, as clinically warranted . 3. Two subcentimeter pulmonary nodules, indeterminate for metastases. Recommend attention on follow-up chest CT in 3 months. 4. Four scattered small liver masses, suspicious for liver metastases. These could be further evaluated with MRI abdomen without and with IV contrast versus PET-CT, as clinically warranted . 5. Possible small left adrenal metastasis. 6. Suggestion of abnormal endometrial thickening (10 mm). Recommend correlation would transabdominal and transvaginal pelvic ultrasound. 7. Mild L3 and L4 vertebral compression fractures of indeterminate chronicity. 8. Additional chronic findings as above. Electronically Signed   By: Ilona Sorrel M.D.   On: 12/24/2016 18:07   Ct Angio Chest Pe W Or Wo Contrast  Result Date: 12/17/2016 CLINICAL DATA:  Shortness of Breath EXAM: CT ANGIOGRAPHY CHEST WITH CONTRAST TECHNIQUE: Multidetector CT imaging of the chest was performed using the standard protocol during bolus administration of intravenous contrast. Multiplanar CT image reconstructions and MIPs were obtained to evaluate the vascular anatomy. CONTRAST:  75 mL Isovue 370 nonionic COMPARISON:  Chest radiograph December 17, 2016 FINDINGS: Cardiovascular: There is no demonstrable pulmonary embolus. There is no thoracic aortic aneurysm or dissection. Main pulmonary outflow tract measures 3.1 cm in length. There is mild calcification at the origin of the left subclavian artery. Other visualized great vessels appear unremarkable. Pericardium is not appreciably thickened. There is left ventricular  hypertrophy. Mediastinum/Nodes: Visualized thyroid appears unremarkable. There is no appreciable thoracic adenopathy. Lungs/Pleura: There is mild bibasilar atelectasis. There is no parenchymal lung edema or consolidation. No pleural effusion or pleural thickening evident. Upper Abdomen: There is diffuse hepatic steatosis. There is an enhancing focus in the anterior segment of the right lobe of the liver measuring 1.7 x 1.7 cm. A similar appearing lesion is noted near the dome of the liver in the medial segment left lobe measuring 1.5 x 1.3 cm. No other focal liver lesions are apparent ; note that liver is incompletely visualized. Visualized upper abdominal structures otherwise appear unremarkable. Musculoskeletal: There is degenerative change in the thoracic spine. There are no blastic or lytic bone lesions. There are several healed rib fractures on the left. Review of the MIP images confirms the above findings. IMPRESSION: No demonstrable pulmonary embolus. Prominence of the main pulmonary outflow tract suggests a degree of pulmonary  arterial hypertension. No adenopathy. No lung edema or consolidation.  Bibasilar atelectasis noted. Enhancing lesions in the liver near the dome noted. Etiology uncertain. This finding warrants nonemergent pre and serial post-contrast CT or MR of the liver to further evaluate. Old healed rib fractures on the left. Left ventricular hypertrophy. Electronically Signed   By: Lowella Grip III M.D.   On: 12/17/2016 13:51   Mr Cervical Spine W Wo Contrast  Result Date: 12/25/2016 CLINICAL DATA:  Metastatic disease.  Breast mass. EXAM: MRI TOTAL SPINE WITHOUT AND WITH CONTRAST TECHNIQUE: Multisequence MR imaging of the spine from the cervical spine to the sacrum was performed prior to and following IV contrast administration for evaluation of spinal metastatic disease. CONTRAST:  10m MULTIHANCE GADOBENATE DIMEGLUMINE 529 MG/ML IV SOLN COMPARISON:  CT chest abdomen pelvis 12/24/2016  FINDINGS: MRI CERVICAL SPINE FINDINGS Image quality degraded by motion. Alignment: Normal Vertebrae: No fracture identified. Enhancing lesion C7 vertebral body compatible with metastatic disease. No epidural extension. T3 metastatic disease also noted. Cord: Negative for cord compression.  Cord signal normal. Posterior Fossa, vertebral arteries, paraspinal tissues: Negative Disc levels: Mild disc degeneration at C4-5 and C5-6 and C6-7. Mild facet degeneration on the left at C4-5. Left foraminal narrowing C5-6 due to disc protrusion and spurring. MRI THORACIC SPINE FINDINGS Alignment:  Normal Vertebrae: Metastatic disease throughout the T3 vertebral body extending into the pedicle and posterior elements. There is also tumor in the right foramen at T2-3 and T3-4 which could cause radicular symptoms. Slight epidural tumor without cord compression. Tumor also present in the spinous processes of T1, T2, T3, T8. Small tumor deposit T5 vertebral body and right superior articulating facet. Tumor in the right sixth rib. Small enhancing lesions T12 vertebral body compatible with metastatic disease. Accentuated dorsal kyphosis.  No pathologic fracture Cord:  Negative for cord compression.  Spinal cord signal normal. Paraspinal and other soft tissues: Negative Disc levels: Multilevel thoracic disc degeneration without significant spinal stenosis. MRI LUMBAR SPINE FINDINGS Segmentation:  Normal Alignment:  Normal Vertebrae: Enhancing metastatic disease is seen in the lumbar spine. Enhancing lesion on the right at L2. Diffuse vertebral body enhancement of L3 with pathologic fracture. Enhancing tumor extends in the pedicle and posterior elements on the right without foraminal encroachment. Small enhancing lesion S2 on the left. Mild fracture of L4 appears chronic and benign. Conus medullaris: Extends to the L1-2 level and appears normal. Paraspinal and other soft tissues: Diffuse paraspinous muscle atrophy. No retroperitoneal  adenopathy Disc levels: L1-2:  Negative L2-3:  Disc and facet degeneration with moderate spinal stenosis L3-4:  Disc and facet degeneration with severe spinal stenosis L4-5:  Disc and facet degeneration with moderate spinal stenosis L5-S1:  Severe facet degeneration without stenosis. IMPRESSION: Metastatic disease C7 vertebral body without epidural tumor or fracture Multiple metastatic deposits in the thoracic and lumbar spine. Prominent tumor is present on the right at T3 extending into the soft tissues as well as the foramina on the right at T2-3 and T3-4. See above detailed Advanced lumbar degenerative changes with spinal stenosis at L2-3, L3-4, L4-5 Electronically Signed   By: CFranchot GalloM.D.   On: 12/25/2016 11:51   Mr Thoracic Spine W Wo Contrast  Result Date: 12/25/2016 CLINICAL DATA:  Metastatic disease.  Breast mass. EXAM: MRI TOTAL SPINE WITHOUT AND WITH CONTRAST TECHNIQUE: Multisequence MR imaging of the spine from the cervical spine to the sacrum was performed prior to and following IV contrast administration for evaluation of spinal metastatic disease. CONTRAST:  6m MULTIHANCE GADOBENATE DIMEGLUMINE 529 MG/ML IV SOLN COMPARISON:  CT chest abdomen pelvis 12/24/2016 FINDINGS: MRI CERVICAL SPINE FINDINGS Image quality degraded by motion. Alignment: Normal Vertebrae: No fracture identified. Enhancing lesion C7 vertebral body compatible with metastatic disease. No epidural extension. T3 metastatic disease also noted. Cord: Negative for cord compression.  Cord signal normal. Posterior Fossa, vertebral arteries, paraspinal tissues: Negative Disc levels: Mild disc degeneration at C4-5 and C5-6 and C6-7. Mild facet degeneration on the left at C4-5. Left foraminal narrowing C5-6 due to disc protrusion and spurring. MRI THORACIC SPINE FINDINGS Alignment:  Normal Vertebrae: Metastatic disease throughout the T3 vertebral body extending into the pedicle and posterior elements. There is also tumor in the  right foramen at T2-3 and T3-4 which could cause radicular symptoms. Slight epidural tumor without cord compression. Tumor also present in the spinous processes of T1, T2, T3, T8. Small tumor deposit T5 vertebral body and right superior articulating facet. Tumor in the right sixth rib. Small enhancing lesions T12 vertebral body compatible with metastatic disease. Accentuated dorsal kyphosis.  No pathologic fracture Cord:  Negative for cord compression.  Spinal cord signal normal. Paraspinal and other soft tissues: Negative Disc levels: Multilevel thoracic disc degeneration without significant spinal stenosis. MRI LUMBAR SPINE FINDINGS Segmentation:  Normal Alignment:  Normal Vertebrae: Enhancing metastatic disease is seen in the lumbar spine. Enhancing lesion on the right at L2. Diffuse vertebral body enhancement of L3 with pathologic fracture. Enhancing tumor extends in the pedicle and posterior elements on the right without foraminal encroachment. Small enhancing lesion S2 on the left. Mild fracture of L4 appears chronic and benign. Conus medullaris: Extends to the L1-2 level and appears normal. Paraspinal and other soft tissues: Diffuse paraspinous muscle atrophy. No retroperitoneal adenopathy Disc levels: L1-2:  Negative L2-3:  Disc and facet degeneration with moderate spinal stenosis L3-4:  Disc and facet degeneration with severe spinal stenosis L4-5:  Disc and facet degeneration with moderate spinal stenosis L5-S1:  Severe facet degeneration without stenosis. IMPRESSION: Metastatic disease C7 vertebral body without epidural tumor or fracture Multiple metastatic deposits in the thoracic and lumbar spine. Prominent tumor is present on the right at T3 extending into the soft tissues as well as the foramina on the right at T2-3 and T3-4. See above detailed Advanced lumbar degenerative changes with spinal stenosis at L2-3, L3-4, L4-5 Electronically Signed   By: CFranchot GalloM.D.   On: 12/25/2016 11:51   Mr  Lumbar Spine W Wo Contrast  Result Date: 12/25/2016 CLINICAL DATA:  Metastatic disease.  Breast mass. EXAM: MRI TOTAL SPINE WITHOUT AND WITH CONTRAST TECHNIQUE: Multisequence MR imaging of the spine from the cervical spine to the sacrum was performed prior to and following IV contrast administration for evaluation of spinal metastatic disease. CONTRAST:  29mMULTIHANCE GADOBENATE DIMEGLUMINE 529 MG/ML IV SOLN COMPARISON:  CT chest abdomen pelvis 12/24/2016 FINDINGS: MRI CERVICAL SPINE FINDINGS Image quality degraded by motion. Alignment: Normal Vertebrae: No fracture identified. Enhancing lesion C7 vertebral body compatible with metastatic disease. No epidural extension. T3 metastatic disease also noted. Cord: Negative for cord compression.  Cord signal normal. Posterior Fossa, vertebral arteries, paraspinal tissues: Negative Disc levels: Mild disc degeneration at C4-5 and C5-6 and C6-7. Mild facet degeneration on the left at C4-5. Left foraminal narrowing C5-6 due to disc protrusion and spurring. MRI THORACIC SPINE FINDINGS Alignment:  Normal Vertebrae: Metastatic disease throughout the T3 vertebral body extending into the pedicle and posterior elements. There is also tumor in the right foramen at  T2-3 and T3-4 which could cause radicular symptoms. Slight epidural tumor without cord compression. Tumor also present in the spinous processes of T1, T2, T3, T8. Small tumor deposit T5 vertebral body and right superior articulating facet. Tumor in the right sixth rib. Small enhancing lesions T12 vertebral body compatible with metastatic disease. Accentuated dorsal kyphosis.  No pathologic fracture Cord:  Negative for cord compression.  Spinal cord signal normal. Paraspinal and other soft tissues: Negative Disc levels: Multilevel thoracic disc degeneration without significant spinal stenosis. MRI LUMBAR SPINE FINDINGS Segmentation:  Normal Alignment:  Normal Vertebrae: Enhancing metastatic disease is seen in the lumbar  spine. Enhancing lesion on the right at L2. Diffuse vertebral body enhancement of L3 with pathologic fracture. Enhancing tumor extends in the pedicle and posterior elements on the right without foraminal encroachment. Small enhancing lesion S2 on the left. Mild fracture of L4 appears chronic and benign. Conus medullaris: Extends to the L1-2 level and appears normal. Paraspinal and other soft tissues: Diffuse paraspinous muscle atrophy. No retroperitoneal adenopathy Disc levels: L1-2:  Negative L2-3:  Disc and facet degeneration with moderate spinal stenosis L3-4:  Disc and facet degeneration with severe spinal stenosis L4-5:  Disc and facet degeneration with moderate spinal stenosis L5-S1:  Severe facet degeneration without stenosis. IMPRESSION: Metastatic disease C7 vertebral body without epidural tumor or fracture Multiple metastatic deposits in the thoracic and lumbar spine. Prominent tumor is present on the right at T3 extending into the soft tissues as well as the foramina on the right at T2-3 and T3-4. See above detailed Advanced lumbar degenerative changes with spinal stenosis at L2-3, L3-4, L4-5 Electronically Signed   By: Franchot Gallo M.D.   On: 12/25/2016 11:51   Nm Bone Scan Whole Body  Result Date: 12/26/2016 CLINICAL DATA:  Breast cancer. EXAM: NUCLEAR MEDICINE WHOLE BODY BONE SCAN TECHNIQUE: Whole body anterior and posterior images were obtained approximately 3 hours after intravenous injection of radiopharmaceutical. RADIOPHARMACEUTICALS:  25 MCi Technetium-66mMDP IV COMPARISON:  Bone survey 12/23/2016. FINDINGS: Bilateral renal function excretion. Increased focal activity noted over the occiput. Multiple areas of increased activity noted over the spine and ribs. Focal area of increased activity noted over the distal left humerus. These findings are consistent with metastatic disease. IMPRESSION: Findings consistent with metastatic disease, with involvement of the skull, spine, ribs, and left  humerus. Electronically Signed   By: TMarcello Moores Register   On: 12/26/2016 16:31   Ct Abdomen Pelvis W Contrast  Result Date: 12/24/2016 CLINICAL DATA:  Inpatient admitted with pathologic right humerus fracture and left breast mass. Suspected metastatic breast cancer. Staging. EXAM: CT CHEST, ABDOMEN, AND PELVIS WITH CONTRAST TECHNIQUE: Multidetector CT imaging of the chest, abdomen and pelvis was performed following the standard protocol during bolus administration of intravenous contrast. CONTRAST:  1051mISOVUE-300 IOPAMIDOL (ISOVUE-300) INJECTION 61% COMPARISON:  12/17/2016 chest CT. FINDINGS: CT CHEST FINDINGS Cardiovascular: Top-normal heart size. No significant pericardial fluid/thickening. Atherosclerotic nonaneurysmal thoracic aorta. Normal caliber pulmonary arteries. No central pulmonary emboli. Mediastinum/Nodes: No discrete thyroid nodules. Unremarkable esophagus. No pathologically enlarged axillary, mediastinal or hilar lymph nodes. Lungs/Pleura: No pneumothorax. No pleural effusion. Right middle lobe 5 mm (series 4/image 81) and 4 mm lingular (series 4/image 68) solid pulmonary nodules, both stable since 12/17/2016. Subsegmental atelectasis in the dependent lungs bilaterally. Otherwise no acute consolidative airspace disease, lung masses or additional significant pulmonary nodules. Musculoskeletal: Expansile lytic destructive bone lesion at the right third costovertebral junction (series 4/image 20) with mass-effect on the right thoracic spinal canal. Healed  deformities of the lateral left seventh and eighth ribs. Partially visualized surgical fixation of the right distal humerus. Marked thoracic spondylosis . Irregular solid 5.3 x 2.8 cm outer left breast mass, extending to the skin (series 3/image 30). CT ABDOMEN PELVIS FINDINGS Hepatobiliary: Diffuse hepatic steatosis. The known liver masses (at least 4) in the liver seen on the 12/17/2016 chest CT study are poorly visualized on the routine phase  sequence through the liver on today's scan, probably due to differences in contrast timing. Representative liver masses include a 1.7 x 1.5 cm liver dome lesion (series 3/ image 43), a 1.5 x 1.1 cm segment 4B left liver lobe lesion (series 8/ image 7) and a 0.9 x 0.7 cm segment 3 left liver lobe lesion (series 8/image 8). Normal gallbladder with no radiopaque cholelithiasis. No biliary ductal dilatation. Pancreas: Normal, with no mass or duct dilation. Spleen: Normal size. No mass. Adrenals/Urinary Tract: Suggestion of an indeterminate 1.2 cm lower left adrenal nodule (series 3/ image 54) with density 875 HU. No additional discrete adrenal nodules. No hydronephrosis. Mild segmental renal cortical scarring in the upper right kidney. No renal mass. Bladder collapsed by indwelling Foley catheter, limiting bladder evaluation. Grossly normal bladder. Stomach/Bowel: Grossly normal stomach. Normal caliber small bowel with no small bowel wall thickening. Appendectomy. Normal large bowel with no diverticulosis, large bowel wall thickening or pericolonic fat stranding. Vascular/Lymphatic: Atherosclerotic nonaneurysmal abdominal aorta. Patent portal, splenic, hepatic and renal veins. No pathologically enlarged lymph nodes in the abdomen or pelvis. Reproductive: Suggestion of abnormal endometrial thickening (approximately 10 mm). No adnexal mass. Other: No pneumoperitoneum, ascites or focal fluid collection. Small fat containing umbilical hernia. Musculoskeletal: No aggressive appearing focal osseous lesions. Mild L3 and L4 superior vertebral compression fractures of indeterminate chronicity. Moderate lumbar spondylosis . IMPRESSION: 1. Irregular solid 5.3 cm upper left breast mass extending to the skin, suspicious for primary left breast malignancy. Recommend correlation with diagnostic mammographic evaluation. 2. Expansile lytic destructive bone lesion at the right third costovertebral junction with mass-effect on the right  thoracic spinal canal, compatible with osseous metastasis. Consider correlation with thoracic spine MRI without and with IV contrast to evaluate for possible thoracic cord compression, as clinically warranted . 3. Two subcentimeter pulmonary nodules, indeterminate for metastases. Recommend attention on follow-up chest CT in 3 months. 4. Four scattered small liver masses, suspicious for liver metastases. These could be further evaluated with MRI abdomen without and with IV contrast versus PET-CT, as clinically warranted . 5. Possible small left adrenal metastasis. 6. Suggestion of abnormal endometrial thickening (10 mm). Recommend correlation would transabdominal and transvaginal pelvic ultrasound. 7. Mild L3 and L4 vertebral compression fractures of indeterminate chronicity. 8. Additional chronic findings as above. Electronically Signed   By: Ilona Sorrel M.D.   On: 12/24/2016 18:07   [place] Core Biopsy  Addendum Date: 12/28/2016   ADDENDUM REPORT: 12/28/2016 13:44 ADDENDUM: PATHOLOGY ADDENDUM: Pathology: INVASIVE DUCTAL CARCINOMA, GRADE 2 AND HIGH-GRADE DCIS. Pathology concordant with imaging findings: Yes. The findings and recommendations were discussed with Dr. Marin Olp on 12/28/2016 at 1:15 p.m. RECOMMENDATION: 1. Consider bilateral diagnostic mammography at the [facility] as this has not yet been performed. 2. Ultrasound-guided tissue marker clip placement within the mass in the upper outer quadrant of the left breast at the time of diagnostic workup (if the patient is considered a potential surgical candidate). Please see localization/excision considerations below. 3. Treatment plan. **LOCALIZATION/EXCISION CONSIDERATIONS: Because the biopsy was performed in the hospital rather than at the [facility], a biopsy tissue marker clip was not available for placement after the biopsy. Therefore, ultrasound-guided placement of a tissue marker clip within the biopsied  mass prior to or just after beginning the neoadjuvant therapy should be done if the patient is considered for surgical excision. (If the tumor has a complete imaging response to neoadjuvant chemotherapy, there will be no way to localize the site prior to excision if a tissue marker clip is not placed). If the patient is not deemed a surgical candidate, then clip placement is not essential. ** Electronically Signed   By: Evangeline Dakin M.D.   On: 12/28/2016 13:44   Result Date: 12/28/2016 CLINICAL DATA:  66 year old who presented this admission with a pathological fracture involving the distal right humerus for which she will underwent ORIF. CT examination 12/24/2016 demonstrated a 5.3 cm mass involving the upper outer quadrant of the left breast. Biopsy is requested. EXAM: ULTRASOUND GUIDED LEFT BREAST CORE NEEDLE BIOPSY COMPARISON:  No prior breast imaging. CT chest 12/24/2016 is correlated. FINDINGS: I met with the patient and we discussed the procedure of ultrasound-guided biopsy, including benefits and alternatives. We discussed the high likelihood of a successful procedure. We discussed the risks of the procedure, including infection, bleeding, tissue injury, clip migration, and inadequate sampling. Informed written consent was given. The usual time-out protocol was performed immediately prior to the procedure. Initial ultrasound imaging shows a large hypoechoic mass with irregular margins at the 1 o'clock position approximately 6 cm from the nipple, associated with microcalcifications and associated with skin involvement. In fact, the overlying skin is ulcerated. Due to its large size, an accurate ultrasound measurement was unobtainable, though the 5.3 cm size from CT appears accurate. Sonographic evaluation of the left axilla demonstrates no pathologic lymphadenopathy. Lesion quadrant: Upper outer quadrant. Using sterile technique with chlorhexidine as skin antisepsis, 1% Lidocaine as local anesthetic,  under direct ultrasound visualization, a 16 gauge spring-loaded core needle device was used to perform biopsy of the mass involving the upper outer quadrant of the left breast using a lateral approach. Six core samples were obtained and each was immediately placed in formalin. IMPRESSION: 1. Ultrasound guided biopsy of a large mass involving the upper outer quadrant of the left breast. No apparent complications. 2. The skin overlying the mass in the left upper quadrant is ulcerated, and sonographic evaluation confirms skin involvement. 3. No pathologic left axillary lymphadenopathy. Electronically Signed: By: Evangeline Dakin M.D. On: 12/26/2016 16:41   Dg Chest Port 1 View  Result Date: 01/10/2017 CLINICAL DATA:  Shortness of breath. EXAM: PORTABLE CHEST 1 VIEW COMPARISON:  01/09/2017 FINDINGS: The cardiac silhouette is upper limits of normal in size. The lungs are hypoinflated with new mild patchy left basilar opacity. Curvilinear opacity in the right perihilar region likely represents subsegmental atelectasis. No sizable pleural effusion or pneumothorax is identified. Multiple left rib fractures are noted with associated callus formation. IMPRESSION: 1. Low lung volumes with patchy left basilar opacity which may reflect atelectasis or developing pneumonia. 2. Subsegmental atelectasis in the right midlung. Electronically Signed   By: Logan Bores M.D.   On: 01/10/2017 08:14   Dg Chest Port 1 View  Result Date: 01/09/2017 CLINICAL DATA:  Shortness of breath for 2 weeks.  Fever tonight. EXAM: PORTABLE CHEST 1 VIEW COMPARISON:  CTA 12/24/2016 FINDINGS: Low lung volumes. No consolidation to suggest pneumonia. Heart is normal in size. No pleural effusion or pneumothorax. Left lateral rib fracture again seen. IMPRESSION: Low lung volumes.  No evidence of acute abnormality. Electronically Signed   By: Jeb Levering M.D.   On: 01/09/2017 02:48   Dg Chest Port 1 View  Result Date: 12/23/2016 CLINICAL DATA:   Shortness of breath EXAM: PORTABLE CHEST 1 VIEW COMPARISON:  12/17/2016 FINDINGS: Linear atelectasis at the medial left base. Cannot exclude small left pleural effusion. Mild cardiomegaly with atherosclerosis. No overt pulmonary edema. No pneumothorax. Old left rib fracture. IMPRESSION: 1. Cardiomegaly without edema or infiltrate 2. Subsegmental atelectasis at the left lung base. Electronically Signed   By: Donavan Foil M.D.   On: 12/23/2016 19:08   Dg Chest Portable 1 View  Result Date: 12/17/2016 CLINICAL DATA:  Shortness of breath and tachycardia EXAM: PORTABLE CHEST 1 VIEW COMPARISON:  05/13/2012 FINDINGS: Cardiomegaly noted. There is no evidence of focal airspace disease, pulmonary edema, suspicious pulmonary nodule/mass, pleural effusion, or pneumothorax. No acute bony abnormalities are identified. IMPRESSION: Cardiomegaly without evidence of acute cardiopulmonary disease. Electronically Signed   By: Margarette Canada M.D.   On: 12/17/2016 12:11   Dg Bone Survey Met  Result Date: 12/23/2016 CLINICAL DATA:  (Breast lesion. Pathologic fracture of the right humerus. Status post ORIF of the right humerus. Evaluate for metastasis. EXAM: METASTATIC BONE SURVEY COMPARISON:  CXR and right humerus radiographs from 12/17/2016, left ankle radiographs from 05/11/2012, left wrist radiographs small 05/11/2012 FINDINGS: Lateral skull: Occipital lytic skull lucency measuring 13 x 8 mm. No fracture. Both shoulders: Negative for lytic or blastic disease. No acute fracture nor dislocation. Left old sixth and seventh rib fractures are incidentally included. Both humeri: Pathologic fracture through moth eaten lytic lesion of the distal right humeral diaphysis transfixed by plate and screws. Lesion at the junction of the middle and distal third of the humeral shaft. Left humerus is negative. Radius and ulna: Negative for lytic or blastic disease. Volar plate and screw fixation across distal left radius fracture. Cervical spine AP  and lateral: Degenerative disc and facet arthropathy. No suspicious osseous abnormality. Thoracic spine: Multilevel degenerative disc and endplate changes consistent with spondylosis. Lumbar spine: Chronic appearing superior endplate compressions of L1, L3 and L4 without retropulsion. No lytic or blastic disease. Lower lumbar facet arthropathy from L3 through S1. Slight disc space narrowing at L5-S1. PA chest: Normal size heart. Aortic atherosclerosis. Atelectasis at the lung bases. No dominant mass. Elevation of the right hemidiaphragm versus eventration. Old left-sided rib fractures. AP pelvis: Lower lumbar facet arthropathy. Intact SI joints and pubic symphysis with osteoarthritis of the pubic symphysis. Old right inferior pubic ramus fracture. No suspicious lytic or blastic disease of the pelvis and included hips. Both femora, tibia and fibula: Negative for lytic or blastic disease. ORIF of left bimalleolar fractures. IMPRESSION: 13 x 8 mm lytic focus involving the occiput of the skull which could be a normal variant such is an arachnoid granulation. CT may help for further correlation. No additional lytic or blastic disease are apparent. Pathologic fracture transfixed by plate and screws involving the distal right humeral shaft. ORIF of the distal left radius and left bimalleolar fractures are also noted. Cervical, thoracic and lumbar spondylosis. Chronic benign-appearing endplate compressions of L1, L3 and L4. Electronically Signed   By: Ashley Royalty M.D.   On: 12/23/2016 17:11   Dg Humerus Right  Result Date: 12/23/2016 CLINICAL DATA:  OPEN REDUCTION INTERNAL FIXATION (ORIF) DISTAL HUMERUS FRACTURE (Right Arm Upper). Radiation Safety Timeout performed by BIW. Fluoro time 12 seconds. EXAM: DG C-ARM 61-120 MIN; RIGHT HUMERUS - 2+ VIEW COMPARISON:  12/20/2016  FINDINGS: Patient has undergone screw plate fixation of right humerus fracture. A cortical screw traverses the fracture site. There is near anatomic  alignment. Elbow is unremarkable in appearance. IMPRESSION: Status post ORIF of the right humerus. Lucency at the fracture site, raising the question of pathologic fracture. Correlation with history is recommended. Electronically Signed   By: Nolon Nations M.D.   On: 12/23/2016 13:53   Dg Humerus Right  Result Date: 12/17/2016 CLINICAL DATA:  Acute onset pain while lifting heavy object EXAM: RIGHT HUMERUS - 2+ VIEW COMPARISON:  None. FINDINGS: Frontal and lateral views were obtained. There is an obliquely oriented fracture of the distal humerus with medial angulation and anterior displacement of the distal fracture fragment with respect proximal fragment. No other fractures. No dislocations. No abnormal periosteal reaction or appreciable arthropathy. IMPRESSION: Obliquely oriented fracture distal humerus with displacement angulation of fracture fragments. No dislocation. No abnormal periosteal reaction. No appreciable arthropathic change. Electronically Signed   By: Lowella Grip III M.D.   On: 12/17/2016 11:43   Dg Hand Complete Left  Result Date: 12/20/2016 CLINICAL DATA:  Left hand swelling EXAM: LEFT HAND - COMPLETE 3+ VIEW COMPARISON:  None. FINDINGS: No evidence of acute fracture or dislocation. Status post ORIF of the distal radius. Moderate degenerative changes at the 1st carpometacarpal joint. The visualized soft tissues are unremarkable. IMPRESSION: No acute osseus abnormality is seen. Status post ORIF of the distal radius. Moderate degenerative changes of the 1st carpometacarpal joint. Electronically Signed   By: Julian Hy M.D.   On: 12/20/2016 11:10   Dg C-arm 1-60 Min  Result Date: 12/23/2016 CLINICAL DATA:  OPEN REDUCTION INTERNAL FIXATION (ORIF) DISTAL HUMERUS FRACTURE (Right Arm Upper). Radiation Safety Timeout performed by BIW. Fluoro time 12 seconds. EXAM: DG C-ARM 61-120 MIN; RIGHT HUMERUS - 2+ VIEW COMPARISON:  12/20/2016 FINDINGS: Patient has undergone screw plate  fixation of right humerus fracture. A cortical screw traverses the fracture site. There is near anatomic alignment. Elbow is unremarkable in appearance. IMPRESSION: Status post ORIF of the right humerus. Lucency at the fracture site, raising the question of pathologic fracture. Correlation with history is recommended. Electronically Signed   By: Nolon Nations M.D.   On: 12/23/2016 13:53    Microbiology: Recent Results (from the past 240 hour(s))  Blood Culture (routine x 2)     Status: None (Preliminary result)   Collection Time: 01/09/17  2:20 AM  Result Value Ref Range Status   Specimen Description BLOOD BLOOD LEFT FOREARM  Final   Special Requests   Final    BOTTLES DRAWN AEROBIC AND ANAEROBIC Blood Culture adequate volume   Culture NO GROWTH 3 DAYS  Final   Report Status PENDING  Incomplete  Blood Culture (routine x 2)     Status: None (Preliminary result)   Collection Time: 01/09/17  2:26 AM  Result Value Ref Range Status   Specimen Description BLOOD BLOOD RIGHT FOREARM  Final   Special Requests IN PEDIATRIC BOTTLE Blood Culture adequate volume  Final   Culture NO GROWTH 3 DAYS  Final   Report Status PENDING  Incomplete  Culture, Urine     Status: None   Collection Time: 01/09/17 12:31 PM  Result Value Ref Range Status   Specimen Description URINE, RANDOM  Final   Special Requests NONE  Final   Culture NO GROWTH  Final   Report Status 01/11/2017 FINAL  Final     Labs: Basic Metabolic Panel:  Recent Labs Lab 01/09/17 0220 01/10/17 6270  NA 134* 140  K 3.0* 3.6  CL 100* 111  CO2 23 22  GLUCOSE 143* 134*  BUN 7 7  CREATININE 0.60 0.41*  CALCIUM 8.0* 7.0*   Liver Function Tests:  Recent Labs Lab 01/09/17 0220 01/10/17 0318  AST 20 17  ALT 25 19  ALKPHOS 221* 163*  BILITOT 0.8 0.4  PROT 6.8 5.4*  ALBUMIN 2.9* 2.2*   No results for input(s): LIPASE, AMYLASE in the last 168 hours. No results for input(s): AMMONIA in the last 168 hours. CBC:  Recent  Labs Lab 01/09/17 0220 01/10/17 0318  WBC 14.0* 12.1*  NEUTROABS 10.9*  --   HGB 13.1 10.4*  HCT 39.8 32.7*  MCV 102.3* 102.8*  PLT 302 262   Cardiac Enzymes: No results for input(s): CKTOTAL, CKMB, CKMBINDEX, TROPONINI in the last 168 hours. BNP: BNP (last 3 results)  Recent Labs  12/20/16 0458  BNP 167.4*    ProBNP (last 3 results) No results for input(s): PROBNP in the last 8760 hours.  CBG:  Recent Labs Lab 01/10/17 1655  GLUCAP 183*       Signed:  Dia Crawford, MD [facility] [phone] pager

## 2017-01-12 NOTE — Progress Notes (Signed)
PROGRESS NOTE    Taylor Brown  TZG:[id number] DOB: 01-Dec-1950 DOA: 01/09/2017 PCP: Patient, [name]   Brief Narrative:  66 y.o.WF PMHx EtOH abuse, smoking, HTN, and a pathologic fracture of humerous in June 2018 at which time she was found to have metastatic breast CA w/ lesions in the bone, liver, and a few pulmonary nodules.  She returned to the ED c/o SOB of 2 weeks duration, w/ an acute fever. .   Subjective: 7/5  A/O 4 afebrile last 24 hour  negative CP positive SOB, negative N/V, negative abdominal pain. Patient states not on home O2   Assessment & Plan:   Principal Problem:   Acute respiratory failure ([place]) Active Problems:   Alcohol dependence ([place])   Breast cancer metastasized to bone, unspecified laterality (HCC)   Breast cancer metastasized to liver, unspecified laterality ([place])   COPD with acute exacerbation (HCC)   Sepsis ([place])   HCAP (healthcare-associated pneumonia)   Sepsis due to suspected pneumonia/HCAP -Patient meets criteria for sepsis Temp > 30 C, HR> 90, RR > 20  -Patient with new onset O2 demand, leukocytosis, continue current antibiotics -Sputum, blood culture, urine culture pending -Continue saline 37ml/hr -Prednisone 20 mg daily -DuoNeb QID -Flutter valve -Mucinex BID -Titrate O2 to maintain SPO2 89 -93%  COPD exacerbation -See sepsis -Ambulatory SPO2 in the a.m. -PT/OT evaluation: Recommends SNF  SATURATION QUALIFICATIONS: (This note is used to comply with regulatory documentation for home oxygen) Patient Saturations on Room Air at Rest = 98% Patient Saturations on Room Air while Ambulating = 86% Patient Saturations on  Liters of oxygen while Ambulating = % (Pt back to chair prior to being able to replace O2) Please briefly explain why patient needs home oxygen: Pt with decr SpO2 with activity on RA -Will require home O2  Hypokalemia -Resolved  Metastatic breast cancer -Being followed by Oncology and Rad Onc as outpt    Essential HTN -Hold BP medication patiently mildly hypotensive  Alcohol abuse -Currently not an issue    DVT prophylaxis: Lovenox Code Status: Full Family Communication: None Disposition Plan: Discharge next 24-48 hr's   Consultants:  None  Procedures/Significant Events:  None   VENTILATOR SETTINGS: None   Cultures 7/2 blood NGTD  7/2 sputum pending 7/3 strep pneumo/Legionella urine antigen negative  7/3 urine culture negative final    Antimicrobials: Anti-infectives    Start     Stop   01/09/17 1600  vancomycin (VANCOCIN) IVPB 1000 mg/200 mL premix         01/09/17 1400  ceFEPIme (MAXIPIME) 1 g in dextrose 5 % 50 mL IVPB         01/09/17 0230  ceFEPIme (MAXIPIME) 2 g in dextrose 5 % 50 mL IVPB     01/09/17 0308   01/09/17 0230  vancomycin (VANCOCIN) IVPB 1000 mg/200 mL premix     01/09/17 0336       Devices     LINES / TUBES:  None    Continuous Infusions: . ceFEPime (MAXIPIME) IV Stopped (01/12/17 7591)     Objective: Vitals:   01/11/17 1700 01/11/17 2040 01/11/17 2124 01/12/17 0616  BP:  137/65  134/74  Pulse:  83  80  Resp:  (!) 25  (!) 25  Temp: 97.7 F (36.5 C) 98.3 F (36.8 C)  98.2 F (36.8 C)  TempSrc: Axillary Oral  Oral  SpO2:  98% 94% 91%  Weight:    181 lb 3.2 oz (82.2 kg)  Height:  Intake/Output Summary (Last 24 hours) at 01/12/17 0742 Last data filed at 01/12/17 4097  Gross per 24 hour  Intake             1050 ml  Output             1200 ml  Net             -150 ml   Filed Weights   01/09/17 0805 01/11/17 0424 01/12/17 0616  Weight: 175 lb 11.2 oz (79.7 kg) 182 lb 8 oz (82.8 kg) 181 lb 3.2 oz (82.2 kg)    Examination:  General: A/O 4, positive Acute respiratory distress Eyes: negative scleral hemorrhage, negative anisocoria, negative icterus ENT: Negative Runny nose, negative gingival bleeding, Neck:  Negative scars, masses, torticollis, lymphadenopathy, JVD Lungs: diffuse poor air movement,  positive mild expiratory wheezes, negative crackles Cardiovascular: Regular rate and rhythm without murmur gallop or rub normal S1 and S2 Abdomen: Morbid obese, negative abdominal pain, nondistended, positive soft, bowel sounds, no rebound, no ascites, no appreciable mass Extremities: No significant cyanosis, clubbing, or edema bilateral lower extremities Skin: Negative rashes, lesions, ulcers Psychiatric:  Negative depression, negative anxiety, negative fatigue, negative mania  Central nervous system:  Cranial nerves II through XII intact, tongue/uvula midline, all extremities muscle strength 5/5, sensation intact throughout, negative dysarthria, negative expressive aphasia, negative receptive aphasia.  .     Data Reviewed: Care during the described time interval was provided by me .  I have reviewed this patient's available data, including medical history, events of note, physical examination, and all test results as part of my evaluation. I have personally reviewed and interpreted all radiology studies.  CBC:  Recent Labs Lab 01/09/17 0220 01/10/17 0318  WBC 14.0* 12.1*  NEUTROABS 10.9*  --   HGB 13.1 10.4*  HCT 39.8 32.7*  MCV 102.3* 102.8*  PLT 302 353   Basic Metabolic Panel:  Recent Labs Lab 01/09/17 0220 01/10/17 0318  NA 134* 140  K 3.0* 3.6  CL 100* 111  CO2 23 22  GLUCOSE 143* 134*  BUN 7 7  CREATININE 0.60 0.41*  CALCIUM 8.0* 7.0*   GFR: Estimated Creatinine Clearance: 65.7 mL/min (A) (by C-G formula based on SCr of 0.41 mg/dL (L)). Liver Function Tests:  Recent Labs Lab 01/09/17 0220 01/10/17 0318  AST 20 17  ALT 25 19  ALKPHOS 221* 163*  BILITOT 0.8 0.4  PROT 6.8 5.4*  ALBUMIN 2.9* 2.2*   No results for input(s): LIPASE, AMYLASE in the last 168 hours. No results for input(s): AMMONIA in the last 168 hours. Coagulation Profile: No results for input(s): INR, PROTIME in the last 168 hours. Cardiac Enzymes: No results for input(s): CKTOTAL,  CKMB, CKMBINDEX, TROPONINI in the last 168 hours. BNP (last 3 results) No results for input(s): PROBNP in the last 8760 hours. HbA1C: No results for input(s): HGBA1C in the last 72 hours. CBG:  Recent Labs Lab 01/10/17 1655  GLUCAP 183*   Lipid Profile: No results for input(s): CHOL, HDL, LDLCALC, TRIG, CHOLHDL, LDLDIRECT in the last 72 hours. Thyroid Function Tests: No results for input(s): TSH, T4TOTAL, FREET4, T3FREE, THYROIDAB in the last 72 hours. Anemia Panel: No results for input(s): VITAMINB12, FOLATE, FERRITIN, TIBC, IRON, RETICCTPCT in the last 72 hours. Urine analysis:    Component Value Date/Time   COLORURINE YELLOW 01/09/2017 0500   APPEARANCEUR HAZY (A) 01/09/2017 0500   LABSPEC 1.005 01/09/2017 0500   PHURINE 6.0 01/09/2017 0500   GLUCOSEU NEGATIVE 01/09/2017 0500  HGBUR SMALL (A) 01/09/2017 0500   BILIRUBINUR NEGATIVE 01/09/2017 0500   KETONESUR NEGATIVE 01/09/2017 0500   PROTEINUR NEGATIVE 01/09/2017 0500   UROBILINOGEN 0.2 05/13/2012 1440   NITRITE NEGATIVE 01/09/2017 0500   LEUKOCYTESUR SMALL (A) 01/09/2017 0500   Sepsis Labs: @LABRCNTIP (procalcitonin:4,lacticidven:4)  ) Recent Results (from the past 240 hour(s))  Blood Culture (routine x 2)     Status: None (Preliminary result)   Collection Time: 01/09/17  2:20 AM  Result Value Ref Range Status   Specimen Description BLOOD BLOOD LEFT FOREARM  Final   Special Requests   Final    BOTTLES DRAWN AEROBIC AND ANAEROBIC Blood Culture adequate volume   Culture NO GROWTH 2 DAYS  Final   Report Status PENDING  Incomplete  Blood Culture (routine x 2)     Status: None (Preliminary result)   Collection Time: 01/09/17  2:26 AM  Result Value Ref Range Status   Specimen Description BLOOD BLOOD RIGHT FOREARM  Final   Special Requests IN PEDIATRIC BOTTLE Blood Culture adequate volume  Final   Culture NO GROWTH 2 DAYS  Final   Report Status PENDING  Incomplete  Culture, Urine     Status: None   Collection  Time: 01/09/17 12:31 PM  Result Value Ref Range Status   Specimen Description URINE, RANDOM  Final   Special Requests NONE  Final   Culture NO GROWTH  Final   Report Status 01/11/2017 FINAL  Final         Radiology Studies: No results found.      Scheduled Meds: . dextromethorphan-guaiFENesin  1 tablet Oral BID  . docusate sodium  100 mg Oral BID  . enoxaparin (LOVENOX) injection  40 mg Subcutaneous Q24H  . exemestane  25 mg Oral QPC breakfast  . folic acid  1 mg Oral Daily  . ipratropium-albuterol  3 mL Nebulization TID  . mometasone-formoterol  2 puff Inhalation BID  . pantoprazole  40 mg Oral BID AC  . polyethylene glycol  17 g Oral Daily  . predniSONE  20 mg Oral Q breakfast  . senna  1 tablet Oral BID  . thiamine  100 mg Oral Daily  . cyanocobalamin  1,000 mcg Oral Daily   Continuous Infusions: . ceFEPime (MAXIPIME) IV Stopped (01/12/17 0616)     LOS: 3 days    Time spent: 40 minutes    Latriece Anstine, Geraldo Docker, MD [facility] Pager [phone]   If 7PM-7AM, please contact night-coverage [email or web address] Password [email or web address] 01/12/2017, 7:42 AM

## 2017-01-12 NOTE — Clinical Social Work Placement (Signed)
   CLINICAL SOCIAL WORK PLACEMENT  NOTE  Date:  01/12/2017  Patient Details  Name: Taylor Brown MRN: [id number] Date of Birth: Mar 15, 1951  Clinical Social Work is seeking post-discharge placement for this patient at the [place] level of care (*CSW will initial, date and re-position this form in  chart as items are completed):  Yes   Patient/family provided with [place] Work Department's list of facilities offering this level of care within the geographic area requested by the patient (or if unable, by the patient's family).  Yes   Patient/family informed of their freedom to choose among providers that offer the needed level of care, that participate in Medicare, Medicaid or managed care program needed by the patient, have an available bed and are willing to accept the patient.  Yes   Patient/family informed of [place]'s ownership interest in [facility] and [facility], as well as of the fact that they are under no obligation to receive care at these facilities.  PASRR submitted to EDS on       PASRR number received on       Existing PASRR number confirmed on 01/12/17     FL2 transmitted to all facilities in geographic area requested by pt/family on       FL2 transmitted to all facilities within larger geographic area on       Patient informed that his/her managed care company has contracts with or will negotiate with certain facilities, including the following:  [facility]     Yes   Patient/family informed of bed offers received.  Patient chooses bed at [facility]     Physician recommends and patient chooses bed at      Patient to be transferred to [facility] on 01/12/17.  Patient to be transferred to facility by PT[facility]     Patient family notified on 01/12/17 of transfer.  Name of family member notified:  Anderson Malta, daughter     PHYSICIAN Please prepare priority discharge summary, including  medications, Please sign FL2     Additional Comment:    _______________________________________________ Estanislado Emms, LCSW 01/12/2017, 3:58 PM

## 2017-01-12 NOTE — Progress Notes (Signed)
Called report to tracy (nurse) in [facility] regarding patient's transfer. Is aware that patient will be arriving. PTAR scheduled for 6:30 pm.

## 2017-01-12 NOTE — Progress Notes (Signed)
Patient will discharge to [place] Anticipated discharge date: 01/12/17 Family notified: Anderson Malta, daughter Transportation by: Corey Harold  Family has agreed to pay costs associated with using facility outside of insurance network. Holli Humbles was received at 4:30 pm today, so no LOG required. Family reports patient has outpatient appointments with orthopedics, radiation, and oncology and family will provide transportation to these appointments.   CSW signing off.  Estanislado Emms, [place]  Clinical Social Worker

## 2017-01-13 ENCOUNTER — Ambulatory Visit: Payer: Medicare HMO

## 2017-01-13 ENCOUNTER — Ambulatory Visit: Admission: RE | Admit: 2017-01-13 | Payer: Medicare HMO | Source: Ambulatory Visit

## 2017-01-14 LAB — CULTURE, BLOOD (ROUTINE X 2)
Culture: NO GROWTH
Culture: NO GROWTH
Special Requests: ADEQUATE
Special Requests: ADEQUATE

## 2017-01-16 ENCOUNTER — Ambulatory Visit: Payer: Medicare HMO

## 2017-01-16 ENCOUNTER — Ambulatory Visit
Admission: RE | Admit: 2017-01-16 | Discharge: 2017-01-16 | Disposition: A | Discharge status: Home / Self Care | Payer: Medicare HMO | Source: Ambulatory Visit

## 2017-01-16 ENCOUNTER — Telehealth: Payer: Self-pay | Admitting: Radiation Oncology

## 2017-01-16 DIAGNOSIS — Z510 Encounter for antineoplastic radiation therapy: Secondary | ICD-10-CM | POA: Diagnosis not present

## 2017-01-16 DIAGNOSIS — C7951 Secondary malignant neoplasm of bone: Secondary | ICD-10-CM | POA: Diagnosis not present

## 2017-01-16 DIAGNOSIS — C50919 Malignant neoplasm of unspecified site of unspecified female breast: Secondary | ICD-10-CM | POA: Diagnosis not present

## 2017-01-16 DIAGNOSIS — M84422D Pathological fracture, left humerus, subsequent encounter for fracture with routine healing: Secondary | ICD-10-CM | POA: Diagnosis not present

## 2017-01-16 NOTE — Telephone Encounter (Signed)
Left message for Tammy @ [employer] reference need for daily radiation therapy. Awaiting return call.

## 2017-01-16 NOTE — Telephone Encounter (Signed)
No return call received. [employer] again. Tammy confirms their facility will transport the patient initially then, the family will take over. Informed Merrilee Seashore, RT on L2 of this finding. Requested Merrilee Seashore give a treatment calendar to the treatment facility staff today then another calendar to the family on Wednesday.

## 2017-01-17 ENCOUNTER — Ambulatory Visit
Admission: RE | Admit: 2017-01-17 | Discharge: 2017-01-17 | Disposition: A | Discharge status: Home / Self Care | Payer: Medicare HMO | Source: Ambulatory Visit

## 2017-01-17 DIAGNOSIS — C7951 Secondary malignant neoplasm of bone: Secondary | ICD-10-CM | POA: Diagnosis not present

## 2017-01-17 DIAGNOSIS — C50919 Malignant neoplasm of unspecified site of unspecified female breast: Secondary | ICD-10-CM | POA: Diagnosis not present

## 2017-01-17 DIAGNOSIS — Z510 Encounter for antineoplastic radiation therapy: Secondary | ICD-10-CM | POA: Diagnosis not present

## 2017-01-18 ENCOUNTER — Ambulatory Visit
Admission: RE | Admit: 2017-01-18 | Discharge: 2017-01-18 | Disposition: A | Discharge status: Home / Self Care | Payer: Medicare HMO | Source: Ambulatory Visit

## 2017-01-18 DIAGNOSIS — C50919 Malignant neoplasm of unspecified site of unspecified female breast: Secondary | ICD-10-CM | POA: Diagnosis not present

## 2017-01-18 DIAGNOSIS — Z510 Encounter for antineoplastic radiation therapy: Secondary | ICD-10-CM | POA: Diagnosis not present

## 2017-01-18 DIAGNOSIS — C7951 Secondary malignant neoplasm of bone: Secondary | ICD-10-CM | POA: Diagnosis not present

## 2017-01-19 ENCOUNTER — Ambulatory Visit
Admission: RE | Admit: 2017-01-19 | Discharge: 2017-01-19 | Disposition: A | Discharge status: Home / Self Care | Payer: Medicare HMO | Source: Ambulatory Visit

## 2017-01-19 DIAGNOSIS — C50919 Malignant neoplasm of unspecified site of unspecified female breast: Secondary | ICD-10-CM | POA: Diagnosis not present

## 2017-01-19 DIAGNOSIS — C7951 Secondary malignant neoplasm of bone: Secondary | ICD-10-CM | POA: Diagnosis not present

## 2017-01-19 DIAGNOSIS — Z510 Encounter for antineoplastic radiation therapy: Secondary | ICD-10-CM | POA: Diagnosis not present

## 2017-01-20 ENCOUNTER — Ambulatory Visit
Admission: RE | Admit: 2017-01-20 | Discharge: 2017-01-20 | Disposition: A | Discharge status: Home / Self Care | Payer: Medicare HMO | Source: Ambulatory Visit

## 2017-01-20 DIAGNOSIS — C50919 Malignant neoplasm of unspecified site of unspecified female breast: Secondary | ICD-10-CM | POA: Diagnosis not present

## 2017-01-20 DIAGNOSIS — Z510 Encounter for antineoplastic radiation therapy: Secondary | ICD-10-CM | POA: Diagnosis not present

## 2017-01-20 DIAGNOSIS — C7951 Secondary malignant neoplasm of bone: Secondary | ICD-10-CM | POA: Diagnosis not present

## 2017-01-23 ENCOUNTER — Ambulatory Visit
Admission: RE | Admit: 2017-01-23 | Discharge: 2017-01-23 | Disposition: A | Discharge status: Home / Self Care | Payer: Medicare HMO | Source: Ambulatory Visit

## 2017-01-23 DIAGNOSIS — C50919 Malignant neoplasm of unspecified site of unspecified female breast: Secondary | ICD-10-CM | POA: Diagnosis not present

## 2017-01-23 DIAGNOSIS — Z510 Encounter for antineoplastic radiation therapy: Secondary | ICD-10-CM | POA: Diagnosis not present

## 2017-01-23 DIAGNOSIS — C7951 Secondary malignant neoplasm of bone: Secondary | ICD-10-CM | POA: Diagnosis not present

## 2017-01-24 ENCOUNTER — Ambulatory Visit
Admission: RE | Admit: 2017-01-24 | Discharge: 2017-01-24 | Disposition: A | Discharge status: Home / Self Care | Payer: Medicare HMO | Source: Ambulatory Visit | Attending: Radiation Oncology | Admitting: Radiation Oncology

## 2017-01-24 ENCOUNTER — Ambulatory Visit: Payer: Medicare HMO

## 2017-01-24 DIAGNOSIS — C50919 Malignant neoplasm of unspecified site of unspecified female breast: Secondary | ICD-10-CM | POA: Diagnosis not present

## 2017-01-24 DIAGNOSIS — C7951 Secondary malignant neoplasm of bone: Secondary | ICD-10-CM | POA: Diagnosis not present

## 2017-01-24 DIAGNOSIS — Z510 Encounter for antineoplastic radiation therapy: Secondary | ICD-10-CM | POA: Diagnosis not present

## 2017-01-25 ENCOUNTER — Other Ambulatory Visit (HOSPITAL_BASED_OUTPATIENT_CLINIC_OR_DEPARTMENT_OTHER): Payer: Medicare HMO

## 2017-01-25 ENCOUNTER — Ambulatory Visit
Admission: RE | Admit: 2017-01-25 | Discharge: 2017-01-25 | Disposition: A | Discharge status: Home / Self Care | Payer: Medicare HMO | Source: Ambulatory Visit | Attending: Radiation Oncology | Admitting: Radiation Oncology

## 2017-01-25 ENCOUNTER — Ambulatory Visit: Payer: Medicare HMO

## 2017-01-25 ENCOUNTER — Encounter: Payer: Self-pay | Admitting: Hematology & Oncology

## 2017-01-25 ENCOUNTER — Ambulatory Visit (HOSPITAL_BASED_OUTPATIENT_CLINIC_OR_DEPARTMENT_OTHER): Payer: Medicare HMO | Admitting: Hematology & Oncology

## 2017-01-25 ENCOUNTER — Telehealth: Payer: Self-pay | Admitting: *Deleted

## 2017-01-25 ENCOUNTER — Ambulatory Visit (HOSPITAL_BASED_OUTPATIENT_CLINIC_OR_DEPARTMENT_OTHER): Payer: Medicare HMO

## 2017-01-25 ENCOUNTER — Other Ambulatory Visit: Payer: Self-pay | Admitting: *Deleted

## 2017-01-25 VITALS — BP 139/55 | HR 87 | Temp 98.6°F | Resp 19 | Wt 181.0 lb

## 2017-01-25 DIAGNOSIS — C7800 Secondary malignant neoplasm of unspecified lung: Secondary | ICD-10-CM

## 2017-01-25 DIAGNOSIS — C7951 Secondary malignant neoplasm of bone: Secondary | ICD-10-CM

## 2017-01-25 DIAGNOSIS — C787 Secondary malignant neoplasm of liver and intrahepatic bile duct: Secondary | ICD-10-CM

## 2017-01-25 DIAGNOSIS — C50912 Malignant neoplasm of unspecified site of left female breast: Secondary | ICD-10-CM

## 2017-01-25 DIAGNOSIS — Z720 Tobacco use: Secondary | ICD-10-CM

## 2017-01-25 DIAGNOSIS — C50919 Malignant neoplasm of unspecified site of unspecified female breast: Secondary | ICD-10-CM

## 2017-01-25 DIAGNOSIS — Z510 Encounter for antineoplastic radiation therapy: Secondary | ICD-10-CM | POA: Diagnosis not present

## 2017-01-25 DIAGNOSIS — E876 Hypokalemia: Secondary | ICD-10-CM

## 2017-01-25 LAB — CMP (CANCER CENTER ONLY)
ALBUMIN: 3.2 g/dL — AB (ref 3.3–5.5)
ALT(SGPT): 24 U/L (ref 10–47)
AST: 23 U/L (ref 11–38)
Alkaline Phosphatase: 145 U/L — ABNORMAL HIGH (ref 26–84)
BUN, Bld: 7 mg/dL (ref 7–22)
CALCIUM: 8.8 mg/dL (ref 8.0–10.3)
CHLORIDE: 100 meq/L (ref 98–108)
CO2: 31 meq/L (ref 18–33)
Creat: 0.6 mg/dl (ref 0.6–1.2)
Glucose, Bld: 124 mg/dL — ABNORMAL HIGH (ref 73–118)
Potassium: 2.4 mEq/L — CL (ref 3.3–4.7)
Sodium: 138 mEq/L (ref 128–145)
Total Bilirubin: 0.7 mg/dl (ref 0.20–1.60)
Total Protein: 6.6 g/dL (ref 6.4–8.1)

## 2017-01-25 LAB — CBC WITH DIFFERENTIAL (CANCER CENTER ONLY)
BASO#: 0.1 10*3/uL (ref 0.0–0.2)
BASO%: 0.7 % (ref 0.0–2.0)
EOS%: 2.9 % (ref 0.0–7.0)
Eosinophils Absolute: 0.2 10*3/uL (ref 0.0–0.5)
HEMATOCRIT: 37.4 % (ref 34.8–46.6)
HEMOGLOBIN: 12.2 g/dL (ref 11.6–15.9)
LYMPH#: 1.1 10*3/uL (ref 0.9–3.3)
LYMPH%: 15.3 % (ref 14.0–48.0)
MCH: 33.1 pg (ref 26.0–34.0)
MCHC: 32.6 g/dL (ref 32.0–36.0)
MCV: 101 fL (ref 81–101)
MONO#: 0.6 10*3/uL (ref 0.1–0.9)
MONO%: 8.3 % (ref 0.0–13.0)
NEUT%: 72.8 % (ref 39.6–80.0)
NEUTROS ABS: 5.1 10*3/uL (ref 1.5–6.5)
Platelets: 290 10*3/uL (ref 145–400)
RBC: 3.69 10*6/uL — ABNORMAL LOW (ref 3.70–5.32)
RDW: 15.8 % — ABNORMAL HIGH (ref 11.1–15.7)
WBC: 7 10*3/uL (ref 3.9–10.0)

## 2017-01-25 LAB — LACTATE DEHYDROGENASE: LDH: 270 U/L — AB (ref 125–245)

## 2017-01-25 MED ORDER — ABEMACICLIB 100 MG PO TABS: 100.0000 mg | ORAL_TABLET | Freq: Two times a day (BID) | ORAL | 5 refills | Status: DC

## 2017-01-25 MED ORDER — ZOLEDRONIC ACID 4 MG/100ML IV SOLN
4.0000 mg | Freq: Once | INTRAVENOUS | Status: AC
  Administered 2017-01-25: 4 mg via INTRAVENOUS
  Filled 2017-01-25: qty 100

## 2017-01-25 MED ORDER — POTASSIUM CHLORIDE CRYS ER 20 MEQ PO TBCR: 40.0000 meq | EXTENDED_RELEASE_TABLET | Freq: Every day | ORAL | 2 refills | Status: DC

## 2017-01-25 MED ORDER — POTASSIUM CHLORIDE CRYS ER 20 MEQ PO TBCR
40.0000 meq | EXTENDED_RELEASE_TABLET | Freq: Once | ORAL | Status: AC
  Administered 2017-01-25: 40 meq via ORAL
  Filled 2017-01-25: qty 2

## 2017-01-25 MED ORDER — SODIUM CHLORIDE 0.9 % IV SOLN
Freq: Once | INTRAVENOUS | Status: AC
  Administered 2017-01-25: 12:00:00 via INTRAVENOUS

## 2017-01-25 MED FILL — VERZENIO 100 MG TAB: 100 | 28 days supply | Qty: 56 | Fill #0

## 2017-01-25 NOTE — Patient Instructions (Signed)

## 2017-01-25 NOTE — Telephone Encounter (Signed)
Critical Value Potassium 2.4 Dr Marin Olp notified. No orders at this time

## 2017-01-25 NOTE — Progress Notes (Signed)
Hematology and Oncology Follow Up Visit  Taylor Brown [id number] [birth year] 66 y.o. 01/25/2017   Principle Diagnosis:   Metastatic Breast Cancer - ER+/HER2- - bone/lung/liver metastasis  Current Therapy:    Aromasin 25 mg po q day  Verzenio 100 mg po BID  Zometa 4 mg IV q 6 weeks  XRT to right humerus     Interim History:  Taylor Brown is back for her first office visit. I saw her in the hospital last month. She presented with a broken right arm. She had been living with by herself. Unfortunately, she had been drinking quite a bit. She also smokes quite a bit. She had a pathologic fracture of the right arm. This is of the right humerus.  She had this fixed. The pathological report ([id number]) showed metastatic adenocarcinoma consistent with breast cancer. Her tumor was ER positive and HER-2 negative.  She then underwent a biopsy of a left breast mass. This was on June 18. 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 This tumor was also ER positive and HER-2 negative.  Staging studies were done which showed bony metastases. She had some liver metastases. There are couple small lung nodules.  She has been getting radiation to the right humerus. She said that she has 3 more treatments left.  She was started on Aromasin in the hospital. IV trying to get her on Versenio. So far, we've not been negative get her this.  She is at [facility]. She seems be doing pretty well there. She enjoys it.  There is no issues with pain. She's had no nausea or vomiting. She's had no cough.  She has some alcohol withdrawal when she was in the hospital. This seems to be doing much better.  There's been no bleeding. She's had no change in bowel or bladder habits. She's had no leg swelling.  I'm not sure when she will be able to go home.  Her daughter comes with her today.  Taylor Brown has a was been so nice. I must say, that she does have a  good sense of humor.  Overall, her performance status is ECOG 2.  Medications:  Current Outpatient Prescriptions:  .  Abemaciclib (VERZENIO) 100 MG TABS, Take 100 mg by mouth 2 (two) times daily., Disp: 60 tablet, Rfl: 5 .  bisacodyl (DULCOLAX) 10 MG suppository, Place 1 suppository (10 mg total) rectally daily as needed for moderate constipation., Disp: 12 suppository, Rfl: 0 .  dextromethorphan-guaiFENesin (MUCINEX DM) 30-600 MG 12hr tablet, Take 1 tablet by mouth 2 (two) times daily., Disp: 15 tablet, Rfl: 0 .  docusate sodium 100 MG CAPS, Take 100 mg by mouth 2 (two) times daily., Disp: 30 capsule, Rfl: 0 .  esomeprazole (NEXIUM) 20 MG capsule, Take 20 mg by mouth daily as needed (for acid reflux). , Disp: , Rfl:  .  exemestane (AROMASIN) 25 MG tablet, Take 1 tablet (25 mg total) by mouth daily after breakfast., Disp: 30 tablet, Rfl: 0 .  folic acid (FOLVITE) 1 MG tablet, Take 1 tablet (1 mg total) by mouth daily., Disp: 30 tablet, Rfl: 1 .  levofloxacin (LEVAQUIN) 750 MG tablet, Take 1 tablet (750 mg total) by mouth daily., Disp: 4 tablet, Rfl: 0 .  loratadine (CLARITIN) 10 MG tablet, Take 10 mg by mouth daily as needed for allergies. , Disp: , Rfl:  .  methocarbamol (ROBAXIN) 500 MG tablet, Take 1 tablet (500 mg total) by mouth every 6 (six)  hours as needed for muscle spasms., Disp: 30 tablet, Rfl: 1 .  mometasone-formoterol (DULERA) 200-5 MCG/ACT AERO, Inhale 2 puffs into the lungs 2 (two) times daily., Disp: 1 Inhaler, Rfl: 0 .  Multiple Vitamin (MULTIVITAMIN WITH MINERALS) TABS, Take 1 tablet by mouth daily., Disp: 30 tablet, Rfl: 0 .  oxyCODONE (OXY IR/ROXICODONE) 5 MG immediate release tablet, Take 1 tablet (5 mg total) by mouth every 6 (six) hours as needed. (Patient taking differently: Take 5 mg by mouth every 6 (six) hours as needed for moderate pain. ), Disp: 10 tablet, Rfl: 0 .  pantoprazole (PROTONIX) 40 MG tablet, Take 1 tablet (40 mg total) by mouth 2 (two) times daily before a  meal., Disp: 60 tablet, Rfl: 1 .  polyethylene glycol (MIRALAX / GLYCOLAX) packet, Take 17 g by mouth daily., Disp: 14 each, Rfl: 0 .  predniSONE (DELTASONE) 20 MG tablet, Take 0.5 tablets (10 mg total) by mouth daily with breakfast., Disp: 10 tablet, Rfl: 0 .  senna (SENOKOT) 8.6 MG TABS, Take 1 tablet (8.6 mg total) by mouth 2 (two) times daily., Disp: 60 tablet, Rfl: 0 .  thiamine 100 MG tablet, Take 1 tablet (100 mg total) by mouth daily., Disp: 30 tablet, Rfl: 1 .  vitamin B-12 1000 MCG tablet, Take 1 tablet (1,000 mcg total) by mouth daily., Disp: 30 tablet, Rfl: 1  Allergies:  Allergies  Allergen Reactions  . Ativan [Lorazepam] Other (See Comments)    Makes confused  . No Known Allergies     Past Medical History, Surgical history, Social history, and Family History were reviewed and updated.  Review of Systems:  As above  Physical Exam:  weight is 181 lb (82.1 kg). Her oral temperature is 98.6 F (37 C). Her blood pressure is 139/55 (abnormal) and her pulse is 87. Her respiration is 19 and oxygen saturation is 98%.   Wt Readings from Last 3 Encounters:  01/25/17 181 lb (82.1 kg)  01/12/17 181 lb 3.2 oz (82.2 kg)  12/18/16 195 lb (88.5 kg)      Mildly obese white female in no obvious distress. Head and neck exam shows no ocular or oral lesions. There are no palpable cervical or supraclavicular lymph nodes. Lungs are clear bilaterally. Cardiac exam regular rate and rhythm with no murmurs, rubs or bruits. Abdomen is soft. She is slightly obese. She has good bowel sounds. There is no guarding or rebound tenderness. She has no palpable liver or spleen tip. Extremities shows some slight swelling of the right upper arm. She has minimal edema in her legs. Back exam shows no tenderness over the spine. Neurological exam shows no focal neurological deficits. Skin exam shows no rashes, ecchymoses or petechia.  Lab Results  Component Value Date   WBC 7.0 01/25/2017   HGB 12.2 01/25/2017    HCT 37.4 01/25/2017   MCV 101 01/25/2017   PLT 290 01/25/2017     Chemistry      Component Value Date/Time   NA 138 01/25/2017 0905   K 2.4 (LL) 01/25/2017 0905   CL 100 01/25/2017 0905   CO2 31 01/25/2017 0905   BUN 7 01/25/2017 0905   CREATININE 0.6 01/25/2017 0905      Component Value Date/Time   CALCIUM 8.8 01/25/2017 0905   ALKPHOS 145 (H) 01/25/2017 0905   AST 23 01/25/2017 0905   ALT 24 01/25/2017 0905   BILITOT 0.70 01/25/2017 0905         Impression and Plan: Ms. Dix is  A 66 year old white female. She is post menopausal. She has metastatic breast cancer. Unfortunately, she had not had a mammogram for many years. She was living by herself.  She has metastatic disease. She and her daughter both understand that this is some that is treatable but not curable.  Hopefully we can get her on the Versenio along with the Aromasin. I think the combination of the 2 will clearly be beneficial for her.  Her CA 27.29 really was never that elevated. I'm not sure we can use this for measuring of response to treatment.  Our goal is her quality of life. Hopefully, she will not start drinking again. She has been clean of alcohol now for over a month.  I probably would not do any scans on her for at least 2 or 3 months.  We will go ahead with her Zometa today.  We will plan to get her back in 6 weeks. Maybe, by then, she will be at home, or with her family.  I spent about 45 minutes with her today. Her daughter was with her. We had a hard time forgot what medications she was on.  She clearly needs potassium. I will give her a prescription for potassium to take at the nursing center.     Volanda Napoleon, MD 7/18/201810:58 AM

## 2017-01-26 ENCOUNTER — Ambulatory Visit
Admission: RE | Admit: 2017-01-26 | Discharge: 2017-01-26 | Disposition: A | Discharge status: Home / Self Care | Payer: Medicare HMO | Source: Ambulatory Visit | Attending: Radiation Oncology | Admitting: Radiation Oncology

## 2017-01-26 ENCOUNTER — Ambulatory Visit: Payer: Medicare HMO

## 2017-01-26 DIAGNOSIS — C7951 Secondary malignant neoplasm of bone: Secondary | ICD-10-CM | POA: Diagnosis not present

## 2017-01-26 DIAGNOSIS — C50919 Malignant neoplasm of unspecified site of unspecified female breast: Secondary | ICD-10-CM | POA: Diagnosis not present

## 2017-01-26 DIAGNOSIS — Z510 Encounter for antineoplastic radiation therapy: Secondary | ICD-10-CM | POA: Diagnosis not present

## 2017-01-26 LAB — CANCER ANTIGEN 27.29: CA 27.29: 19.7 U/mL (ref 0.0–38.6)

## 2017-01-27 ENCOUNTER — Ambulatory Visit: Payer: Medicare HMO

## 2017-01-27 ENCOUNTER — Encounter: Payer: Self-pay | Admitting: Radiation Oncology

## 2017-01-27 ENCOUNTER — Other Ambulatory Visit: Payer: Self-pay | Admitting: Radiation Oncology

## 2017-01-27 ENCOUNTER — Ambulatory Visit
Admission: RE | Admit: 2017-01-27 | Discharge: 2017-01-27 | Disposition: A | Discharge status: Home / Self Care | Payer: Medicare HMO | Source: Ambulatory Visit | Attending: Radiation Oncology | Admitting: Radiation Oncology

## 2017-01-27 DIAGNOSIS — C7951 Secondary malignant neoplasm of bone: Secondary | ICD-10-CM | POA: Diagnosis not present

## 2017-01-27 DIAGNOSIS — C50919 Malignant neoplasm of unspecified site of unspecified female breast: Secondary | ICD-10-CM | POA: Diagnosis not present

## 2017-01-27 DIAGNOSIS — G9340 Encephalopathy, unspecified: Secondary | ICD-10-CM

## 2017-01-27 DIAGNOSIS — C787 Secondary malignant neoplasm of liver and intrahepatic bile duct: Secondary | ICD-10-CM

## 2017-01-27 DIAGNOSIS — K209 Esophagitis, unspecified without bleeding: Secondary | ICD-10-CM

## 2017-01-27 DIAGNOSIS — Z510 Encounter for antineoplastic radiation therapy: Secondary | ICD-10-CM | POA: Diagnosis not present

## 2017-01-27 MED ORDER — SUCRALFATE 1 G PO TABS: 1.0000 g | ORAL_TABLET | Freq: Three times a day (TID) | ORAL | 2 refills | Status: AC

## 2017-01-27 MED ORDER — SUCRALFATE 1 G PO TABS: 1.0000 g | ORAL_TABLET | Freq: Three times a day (TID) | ORAL | 2 refills | Status: DC

## 2017-01-27 NOTE — Progress Notes (Signed)
  Radiation Oncology         [phone] ________________________________  Name: Taylor Brown MRN: [id number]  Date: 01/27/2017  DOB: [birth year]  End of Treatment Note  Diagnosis:  66 y.o. 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  Indication for treatment: Palliative        Radiation treatment dates:  01/16/2017 - 01/27/2017  Site/dose:  1.) Spine_L / 30Gy in 10 fractions 2.) Ext_Rt_UppArm/ 30Gy in 10 fractions 3.) Spine_T/ 30Gy in 10 fractions  Beams/energy:  1.) Photons with Complex Isodose technique/15X 2.) Photons with Complex Isodose technique/6&10X 3.) Photons with Complex Isodose technique/10&15X  Narrative: The patient tolerated radiation treatment relatively well.    Plan: The patient has completed radiation treatment. The patient will return to radiation oncology clinic for routine followup in one month. I advised her to call or return sooner if she has any questions or concerns related to her recovery or treatment. ________________________________  Sheral Apley. Tammi Klippel, M.D.   This document serves as a record of services personally performed by Tyler Pita, MD. It was created on his behalf by Valeta Harms, a trained medical scribe. The creation of this record is based on the scribe's personal observations and the provider's statements to them. This document has been checked and approved by the attending provider.

## 2017-01-30 ENCOUNTER — Ambulatory Visit: Payer: Medicare HMO

## 2017-02-02 DIAGNOSIS — H524 Presbyopia: Secondary | ICD-10-CM | POA: Diagnosis not present

## 2017-02-02 DIAGNOSIS — H25813 Combined forms of age-related cataract, bilateral: Secondary | ICD-10-CM | POA: Diagnosis not present

## 2017-02-02 DIAGNOSIS — H40021 Open angle with borderline findings, high risk, right eye: Secondary | ICD-10-CM | POA: Diagnosis not present

## 2017-02-02 DIAGNOSIS — R69 Illness, unspecified: Secondary | ICD-10-CM | POA: Diagnosis not present

## 2017-02-04 DIAGNOSIS — Z0101 Encounter for examination of eyes and vision with abnormal findings: Secondary | ICD-10-CM | POA: Diagnosis not present

## 2017-02-13 ENCOUNTER — Other Ambulatory Visit: Payer: Medicare HMO

## 2017-02-16 ENCOUNTER — Telehealth: Payer: Self-pay | Admitting: *Deleted

## 2017-02-16 DIAGNOSIS — I82411 Acute embolism and thrombosis of right femoral vein: Secondary | ICD-10-CM | POA: Diagnosis not present

## 2017-02-16 DIAGNOSIS — R69 Illness, unspecified: Secondary | ICD-10-CM | POA: Diagnosis not present

## 2017-02-16 DIAGNOSIS — I2609 Other pulmonary embolism with acute cor pulmonale: Secondary | ICD-10-CM | POA: Diagnosis not present

## 2017-02-16 DIAGNOSIS — C7951 Secondary malignant neoplasm of bone: Secondary | ICD-10-CM | POA: Diagnosis not present

## 2017-02-16 DIAGNOSIS — R05 Cough: Secondary | ICD-10-CM | POA: Diagnosis not present

## 2017-02-16 DIAGNOSIS — I9589 Other hypotension: Secondary | ICD-10-CM | POA: Diagnosis not present

## 2017-02-16 DIAGNOSIS — I82433 Acute embolism and thrombosis of popliteal vein, bilateral: Secondary | ICD-10-CM | POA: Diagnosis not present

## 2017-02-16 DIAGNOSIS — A419 Sepsis, unspecified organism: Secondary | ICD-10-CM | POA: Diagnosis not present

## 2017-02-16 DIAGNOSIS — I959 Hypotension, unspecified: Secondary | ICD-10-CM | POA: Diagnosis not present

## 2017-02-16 DIAGNOSIS — R0600 Dyspnea, unspecified: Secondary | ICD-10-CM | POA: Diagnosis not present

## 2017-02-16 DIAGNOSIS — Z720 Tobacco use: Secondary | ICD-10-CM | POA: Diagnosis not present

## 2017-02-16 DIAGNOSIS — I519 Heart disease, unspecified: Secondary | ICD-10-CM | POA: Diagnosis not present

## 2017-02-16 DIAGNOSIS — F1010 Alcohol abuse, uncomplicated: Secondary | ICD-10-CM | POA: Diagnosis not present

## 2017-02-16 DIAGNOSIS — E2740 Unspecified adrenocortical insufficiency: Secondary | ICD-10-CM | POA: Diagnosis not present

## 2017-02-16 DIAGNOSIS — I824Z3 Acute embolism and thrombosis of unspecified deep veins of distal lower extremity, bilateral: Secondary | ICD-10-CM | POA: Diagnosis not present

## 2017-02-16 DIAGNOSIS — R000 Tachycardia, unspecified: Secondary | ICD-10-CM | POA: Diagnosis not present

## 2017-02-16 DIAGNOSIS — Z7952 Long term (current) use of systemic steroids: Secondary | ICD-10-CM | POA: Diagnosis not present

## 2017-02-16 DIAGNOSIS — C50919 Malignant neoplasm of unspecified site of unspecified female breast: Secondary | ICD-10-CM | POA: Diagnosis not present

## 2017-02-16 DIAGNOSIS — I2699 Other pulmonary embolism without acute cor pulmonale: Secondary | ICD-10-CM | POA: Diagnosis not present

## 2017-02-16 DIAGNOSIS — I82441 Acute embolism and thrombosis of right tibial vein: Secondary | ICD-10-CM | POA: Diagnosis not present

## 2017-02-16 DIAGNOSIS — I824Z1 Acute embolism and thrombosis of unspecified deep veins of right distal lower extremity: Secondary | ICD-10-CM | POA: Diagnosis not present

## 2017-02-16 DIAGNOSIS — C787 Secondary malignant neoplasm of liver and intrahepatic bile duct: Secondary | ICD-10-CM | POA: Diagnosis not present

## 2017-02-16 DIAGNOSIS — R0602 Shortness of breath: Secondary | ICD-10-CM | POA: Diagnosis not present

## 2017-02-16 DIAGNOSIS — R748 Abnormal levels of other serum enzymes: Secondary | ICD-10-CM | POA: Diagnosis not present

## 2017-02-16 DIAGNOSIS — C50912 Malignant neoplasm of unspecified site of left female breast: Secondary | ICD-10-CM | POA: Diagnosis not present

## 2017-02-16 NOTE — Telephone Encounter (Signed)
Patient's daughter calling stating patient has a fever of 101.5 that hasn't responded to tylenol. She has no other symptoms. She is currently on Verzenio.  Instructed daughter to take patient to the ED. Daughter states they are in [place] and she will take her to an ED there.   Dr Marin Olp notified.

## 2017-02-20 ENCOUNTER — Telehealth: Payer: Self-pay | Admitting: *Deleted

## 2017-02-20 NOTE — Telephone Encounter (Signed)
Patient daughter called stated that patient was admitted to [facility] last week with PE and DVT.  Doctors there discontinued the [employer].  Wanted Dr Marin Olp to know.  Dr. Marin Olp wants patient to be seen earlier than 03/07/17. Patient to see Judson Roch on Wednesday 02/22/17.

## 2017-02-22 ENCOUNTER — Ambulatory Visit (HOSPITAL_BASED_OUTPATIENT_CLINIC_OR_DEPARTMENT_OTHER): Payer: Medicare HMO | Admitting: Family

## 2017-02-22 ENCOUNTER — Other Ambulatory Visit (HOSPITAL_BASED_OUTPATIENT_CLINIC_OR_DEPARTMENT_OTHER): Payer: Medicare HMO

## 2017-02-22 VITALS — BP 164/69 | HR 118 | Temp 98.4°F | Resp 22 | Wt 179.0 lb

## 2017-02-22 DIAGNOSIS — Z720 Tobacco use: Secondary | ICD-10-CM | POA: Diagnosis not present

## 2017-02-22 DIAGNOSIS — I82401 Acute embolism and thrombosis of unspecified deep veins of right lower extremity: Secondary | ICD-10-CM | POA: Diagnosis not present

## 2017-02-22 DIAGNOSIS — I2699 Other pulmonary embolism without acute cor pulmonale: Secondary | ICD-10-CM

## 2017-02-22 DIAGNOSIS — C787 Secondary malignant neoplasm of liver and intrahepatic bile duct: Secondary | ICD-10-CM

## 2017-02-22 DIAGNOSIS — C50919 Malignant neoplasm of unspecified site of unspecified female breast: Secondary | ICD-10-CM

## 2017-02-22 DIAGNOSIS — E876 Hypokalemia: Secondary | ICD-10-CM

## 2017-02-22 DIAGNOSIS — C7951 Secondary malignant neoplasm of bone: Secondary | ICD-10-CM

## 2017-02-22 DIAGNOSIS — R600 Localized edema: Secondary | ICD-10-CM

## 2017-02-22 DIAGNOSIS — I82432 Acute embolism and thrombosis of left popliteal vein: Secondary | ICD-10-CM

## 2017-02-22 DIAGNOSIS — C7800 Secondary malignant neoplasm of unspecified lung: Secondary | ICD-10-CM | POA: Diagnosis not present

## 2017-02-22 DIAGNOSIS — R69 Illness, unspecified: Secondary | ICD-10-CM | POA: Diagnosis not present

## 2017-02-22 DIAGNOSIS — I82433 Acute embolism and thrombosis of popliteal vein, bilateral: Secondary | ICD-10-CM

## 2017-02-22 LAB — CMP (CANCER CENTER ONLY)
ALT(SGPT): 18 U/L (ref 10–47)
AST: 26 U/L (ref 11–38)
Albumin: 3.4 g/dL (ref 3.3–5.5)
Alkaline Phosphatase: 81 U/L (ref 26–84)
BILIRUBIN TOTAL: 0.6 mg/dL (ref 0.20–1.60)
BUN: 10 mg/dL (ref 7–22)
CALCIUM: 9.2 mg/dL (ref 8.0–10.3)
CO2: 28 meq/L (ref 18–33)
Chloride: 105 mEq/L (ref 98–108)
Creat: 0.9 mg/dl (ref 0.6–1.2)
GLUCOSE: 96 mg/dL (ref 73–118)
Potassium: 3.7 mEq/L (ref 3.3–4.7)
SODIUM: 142 meq/L (ref 128–145)
Total Protein: 7.2 g/dL (ref 6.4–8.1)

## 2017-02-22 LAB — CBC WITH DIFFERENTIAL (CANCER CENTER ONLY)
BASO#: 0 10*3/uL (ref 0.0–0.2)
BASO%: 0.6 % (ref 0.0–2.0)
EOS%: 0.4 % (ref 0.0–7.0)
Eosinophils Absolute: 0 10*3/uL (ref 0.0–0.5)
HCT: 37.1 % (ref 34.8–46.6)
HGB: 12.2 g/dL (ref 11.6–15.9)
LYMPH#: 0.9 10*3/uL (ref 0.9–3.3)
LYMPH%: 18.3 % (ref 14.0–48.0)
MCH: 32.6 pg (ref 26.0–34.0)
MCHC: 32.9 g/dL (ref 32.0–36.0)
MCV: 99 fL (ref 81–101)
MONO#: 0.6 10*3/uL (ref 0.1–0.9)
MONO%: 11.4 % (ref 0.0–13.0)
NEUT%: 69.3 % (ref 39.6–80.0)
NEUTROS ABS: 3.4 10*3/uL (ref 1.5–6.5)
PLATELETS: 270 10*3/uL (ref 145–400)
RBC: 3.74 10*6/uL (ref 3.70–5.32)
RDW: 17 % — AB (ref 11.1–15.7)
WBC: 4.9 10*3/uL (ref 3.9–10.0)

## 2017-02-22 LAB — LACTATE DEHYDROGENASE: LDH: 376 U/L — ABNORMAL HIGH (ref 125–245)

## 2017-02-22 MED ORDER — IPRATROPIUM-ALBUTEROL 0.5-2.5 (3) MG/3ML IN SOLN: 3.0000 mL | Freq: Four times a day (QID) | RESPIRATORY_TRACT | 1 refills | Status: DC | PRN

## 2017-02-22 MED ORDER — SPIRONOLACTONE 50 MG PO TABS: 50.0000 mg | ORAL_TABLET | Freq: Every day | ORAL | 0 refills | Status: DC

## 2017-02-22 NOTE — Progress Notes (Addendum)
Hematology and Oncology Follow Up Visit  Taylor Brown [id number] [birth year] 66 y.o. 02/22/2017   Principle Diagnosis:  Metastatic Breast Cancer - ER+/HER2- - bone/lung/liver metastasis Bilateral DVT and PEs   Current Therapy:   Aromasin 25 mg po q day Verzenio 100 mg po BID - stopped on 02/16/2017 Zometa 4 mg IV q 6 weeks XRT to right humerus   Interim History:  Taylor Brown is here today for early follow-up after her hospitalization over last weekend. She went to [facility] while staying with her daughter last Thursday with c/o fever. She was found to have an extensive DVT of the right lower extremity, thrombus of the popliteal vein in the left lower extremity. She also had bilateral PE's with mild right heart strain. Her Verzinio was stopped on admission.  She is still smoking.   She has some mild SOB with exertion. Both lower extremities are swollen with pitting edema, worse on right.  She received Heparin during admission and was transitioned to go home on Eliquis. She will finish her 6 days on 10 mg PO BID tomorrow and then reduce her dose to 5 mg PO BID.  She states that she had a nose bleed this morning that stopped with pressure after several minutes.  ECHO showed an EF of 65%.  No n/v, cough, rash, dizziness, vision changes, chest pain, palpitations, abdominal pain or changes in bowel or bladder habits.  She has had a mild headache today. She has taken some Advil.  The numbness and tingling in her arms and hands is unchanged.  She has a good appetite and is staying well hydrated. Her weight is stable.  She is currently in a wheel chair today but states that she has had no issue ambulating at home. She has been able to get up and do house work and [employer].   ECOG Performance Status: 2 - Symptomatic, <50% confined to bed  Medications:  Allergies as of 02/22/2017      Reactions   Ativan [lorazepam] Other (See Comments)   Makes confused   No Known Allergies       Medication List       Accurate as of 02/22/17  1:52 PM. Always use your most recent med list.          Abemaciclib 100 MG Tabs Commonly known as:  VERZENIO Take 100 mg by mouth 2 (two) times daily.   bisacodyl 10 MG suppository Commonly known as:  DULCOLAX Place 1 suppository (10 mg total) rectally daily as needed for moderate constipation.   cyanocobalamin 1000 MCG tablet Take 1 tablet (1,000 mcg total) by mouth daily.   dextromethorphan-guaiFENesin 30-600 MG 12hr tablet Commonly known as:  MUCINEX DM Take 1 tablet by mouth 2 (two) times daily.   DSS 100 MG Caps Take 100 mg by mouth 2 (two) times daily.   esomeprazole 20 MG capsule Commonly known as:  NEXIUM Take 20 mg by mouth daily as needed (for acid reflux).   exemestane 25 MG tablet Commonly known as:  AROMASIN Take 1 tablet (25 mg total) by mouth daily after breakfast.   folic acid 1 MG tablet Commonly known as:  FOLVITE Take 1 tablet (1 mg total) by mouth daily.   ipratropium-albuterol 0.5-2.5 (3) MG/3ML Soln Commonly known as:  DUONEB Take 3 mLs by nebulization every 6 (six) hours as needed.   levofloxacin 750 MG tablet Commonly known as:  LEVAQUIN Take 1 tablet (750 mg total) by mouth daily.   loratadine 10 MG  tablet Commonly known as:  CLARITIN Take 10 mg by mouth daily as needed for allergies.   methocarbamol 500 MG tablet Commonly known as:  ROBAXIN Take 1 tablet (500 mg total) by mouth every 6 (six) hours as needed for muscle spasms.   mometasone-formoterol 200-5 MCG/ACT Aero Commonly known as:  DULERA Inhale 2 puffs into the lungs 2 (two) times daily.   multivitamin with minerals Tabs tablet Take 1 tablet by mouth daily.   oxyCODONE 5 MG immediate release tablet Commonly known as:  Oxy IR/ROXICODONE Take 1 tablet (5 mg total) by mouth every 6 (six) hours as needed.   pantoprazole 40 MG tablet Commonly known as:  PROTONIX Take 1 tablet (40 mg total) by mouth 2 (two) times daily before a meal.   polyethylene  glycol packet Commonly known as:  MIRALAX / GLYCOLAX Take 17 g by mouth daily.   potassium chloride SA 20 MEQ tablet Commonly known as:  K-DUR,KLOR-CON Take 2 tablets (40 mEq total) by mouth daily.   predniSONE 20 MG tablet Commonly known as:  DELTASONE Take 0.5 tablets (10 mg total) by mouth daily with breakfast.   senna 8.6 MG Tabs tablet Commonly known as:  SENOKOT Take 1 tablet (8.6 mg total) by mouth 2 (two) times daily.   spironolactone 50 MG tablet Commonly known as:  ALDACTONE Take 1 tablet (50 mg total) by mouth daily.   sucralfate 1 g tablet Commonly known as:  CARAFATE Take 1 tablet (1 g total) by mouth 4 (four) times daily -  with meals and at bedtime. 5 min before meals for radiation induced esophagitis   thiamine 100 MG tablet Take 1 tablet (100 mg total) by mouth daily.       Allergies:  Allergies  Allergen Reactions  . Ativan [Lorazepam] Other (See Comments)    Makes confused  . No Known Allergies     Past Medical History, Surgical history, Social history, and Family History were reviewed and updated.  Review of Systems: All other 10 point review of systems is negative.   Physical Exam:  weight is 179 lb (81.2 kg). Her oral temperature is 98.4 F (36.9 C). Her blood pressure is 164/69 (abnormal) and her pulse is 118 (abnormal). Her respiration is 22 (abnormal) and oxygen saturation is 94%.   Wt Readings from Last 3 Encounters:  02/22/17 179 lb (81.2 kg)  01/25/17 181 lb (82.1 kg)  01/12/17 181 lb 3.2 oz (82.2 kg)    Ocular: Sclerae unicteric, pupils equal, round and reactive to light Ear-nose-throat: Oropharynx clear, dentition fair Lymphatic: No cervical, supraclavicular or axillary adenopathy Lungs no rales or rhonchi, good excursion bilaterally Heart regular rate and rhythm, no murmur appreciated Abd soft, nontender, positive bowel sounds, no liver or spleen tip palpated on exam, no fluid wave MSK no focal spinal tenderness, no joint  edema Neuro: non-focal, well-oriented, appropriate affect Breasts: Deferred this visit  Lab Results  Component Value Date   WBC 4.9 02/22/2017   HGB 12.2 02/22/2017   HCT 37.1 02/22/2017   MCV 99 02/22/2017   PLT 270 02/22/2017   Lab Results  Component Value Date   FERRITIN 109 12/22/2016   IRON 42 12/22/2016   TIBC 346 12/22/2016   UIBC 304 12/22/2016   IRONPCTSAT 12 12/22/2016   Lab Results  Component Value Date   RETICCTPCT 4.0 (H) 12/22/2016   RBC 3.74 02/22/2017   No results found for: KPAFRELGTCHN, LAMBDASER, KAPLAMBRATIO No results found for: IGGSERUM, IGA, IGMSERUM No results found  for: Ronnald Ramp, A1GS, Nelida Meuse, SPEI   Chemistry      Component Value Date/Time   NA 142 02/22/2017 1152   K 3.7 02/22/2017 1152   CL 105 02/22/2017 1152   CO2 28 02/22/2017 1152   BUN 10 02/22/2017 1152   CREATININE 0.9 02/22/2017 1152      Component Value Date/Time   CALCIUM 9.2 02/22/2017 1152   ALKPHOS 81 02/22/2017 1152   AST 26 02/22/2017 1152   ALT 18 02/22/2017 1152   BILITOT 0.60 02/22/2017 1152      Impression and Plan: Taylor Brown is a very pleasant 66 yo caucasian female with metastatic breast cancer. She was on [facility] and Aromasin and doing well. Unfortunately, she developed Bilateral PE's and DVT's and was hospitalized last week. The Verzinio was stopped. She is still smoking.  She is still taking Aromasin.  She will continue on Eliquis and transition to 5 mg PO BID on Friday.  Greater than 50% of the 25 minute face to face visit was spent counseling and coordinating care.  We will plan to see her back in 1 weeks and repeat her lower extremity US and labs that same day. Both she and her daughter know to contact our office with any questions or concerns and to go to the ED in the event of an emergency. We can certainly see her sooner if need be.   Eliezer Bottom, NP 8/15/20181:52 PM   ADDENDUM:  I saw and examined the  patient with Judson Roch. This was a shared visit.  The Verzenio has only a 5% chance of thromboembolic disease. However, I have to believe that this was a factor in her having the thromboembolic event. The fact that she smokes, probably is not all that active, and has a malignancy, all contribute.  We will have to stop the Versenio. I don't think the other CDK4/CDK6 inhibitors have the problem with thromboembolic disease. As such, we may have to get her switched over to one of the other agents.  We will have to see about getting all this approved by insurance or at least covered so that she will not have to pay a lot of money for a new agent. I think that this class of drug is incredibly important for her. She does have significant metastatic disease.  Of note, her CA 27.29 is stable at 22.  We will start working on obtaining approval for a new CDK4/CDK6 inhibitor. I might use Ribociclib.  We spent about 40 minutes with she and her daughter. We went over the last we went over the scans that she had done in the [facility].   Lattie Haw, MD

## 2017-02-23 LAB — CANCER ANTIGEN 27.29: CAN 27.29: 22.3 U/mL (ref 0.0–38.6)

## 2017-02-23 MED ORDER — RIBOCICLIB SUCCINATE 200 MG PO TABS: 600.0000 mg | ORAL_TABLET | Freq: Every day | ORAL | 3 refills | Status: DC

## 2017-02-23 NOTE — Addendum Note (Signed)
Addended by: Burney Gauze R on: 02/23/2017 05:04 PM   Modules accepted: Orders

## 2017-02-24 ENCOUNTER — Telehealth: Payer: Self-pay | Admitting: Hematology & Oncology

## 2017-02-24 ENCOUNTER — Encounter: Payer: Self-pay | Admitting: Hematology & Oncology

## 2017-02-24 ENCOUNTER — Telehealth: Payer: Self-pay | Admitting: Pharmacist

## 2017-02-24 ENCOUNTER — Other Ambulatory Visit: Payer: Self-pay | Admitting: *Deleted

## 2017-02-24 DIAGNOSIS — C50919 Malignant neoplasm of unspecified site of unspecified female breast: Secondary | ICD-10-CM

## 2017-02-24 DIAGNOSIS — R600 Localized edema: Secondary | ICD-10-CM

## 2017-02-24 DIAGNOSIS — C7951 Secondary malignant neoplasm of bone: Principal | ICD-10-CM

## 2017-02-24 DIAGNOSIS — I82433 Acute embolism and thrombosis of popliteal vein, bilateral: Secondary | ICD-10-CM

## 2017-02-24 MED ORDER — RIBOCICLIB SUCCINATE 200 MG PO TABS: 600.0000 mg | ORAL_TABLET | Freq: Every day | ORAL | 3 refills | Status: AC

## 2017-02-24 NOTE — Telephone Encounter (Signed)
Oral Oncology Patient Advocate Encounter  Received notification from [facility] that prior authorization for KisQail is required.  PA submitted on CoverMyMeds Key [id number] Status is pending  Oral Oncology Clinic will continue to follow.   [place] Patient Advocate 02/24/2017 9:17 AM

## 2017-02-27 MED FILL — KISQALI 600 MG DAILY DOSE: 200 | 28 days supply | Qty: 63 | Fill #0

## 2017-02-27 NOTE — Telephone Encounter (Signed)
Oral Chemotherapy Pharmacist Encounter Received new prescription for Kisqali (ribociclib) for the treatment of in conjunction with exemestane, planned duration until disease progression or unacceptable drug toxicity.  CMP and CBC from 02/22/17 assessed, no relevant lab abnormalities to prevent the start of ribociclib. Patients last ECG 01/09/17 QTc 482 per MD this will be the patient's baseline reference.  Current medication list in Epic reviewed, no DDIs with ribociclib identified.  I spoke with patient and her daughter for overview of ribociclib Thelma Comp). Counseled patient on administration, dosing, side effects, safe handling, and monitoring. Patient will take 600mg  (three 200mg  tablets) by mouth daily. Patient will take for 21 days on, then 7 days off.  Side effects include but not limited to: N/V/D, constipation, fatigue.    Reviewed with patient importance of keeping a medication schedule and plan for any missed doses.  Ms. Mariscal and her daughter voiced understanding and appreciation. All questions answered.  Medication will be mail out to the patient today.  Patient knows to call the office with questions or concerns. Oral Oncology Clinic will continue to follow.  Thank you,  Darl Pikes, PharmD, BCPS Hematology/Oncology Clinical Pharmacist [facility] [phone]  02/27/2017 10:26 AM

## 2017-02-27 NOTE — Telephone Encounter (Addendum)
Oral Oncology Patient Advocate Encounter  Prior Authorization for Thelma Comp has been approved thru patients insurance, but came back with a high copay of $2823.81. I then went thru [facility] Co-pay assistant and they will pay up to $5000.00.  Card # [id number] Bin : [id number] PCN: [id number] Group: [id number]  Effective dates: 08/31/2016 through 02/27/2018  Oral Oncology Clinic will continue to follow.   [place] Patient Advocate 02/27/2017 10:37 AM

## 2017-03-01 ENCOUNTER — Ambulatory Visit (HOSPITAL_BASED_OUTPATIENT_CLINIC_OR_DEPARTMENT_OTHER): Payer: Medicare HMO | Admitting: Family

## 2017-03-01 ENCOUNTER — Other Ambulatory Visit (HOSPITAL_BASED_OUTPATIENT_CLINIC_OR_DEPARTMENT_OTHER): Payer: Medicare HMO

## 2017-03-01 ENCOUNTER — Ambulatory Visit (HOSPITAL_BASED_OUTPATIENT_CLINIC_OR_DEPARTMENT_OTHER)
Admission: RE | Admit: 2017-03-01 | Discharge: 2017-03-01 | Disposition: A | Discharge status: Home / Self Care | Payer: Medicare HMO | Source: Ambulatory Visit | Attending: Family | Admitting: Family

## 2017-03-01 ENCOUNTER — Other Ambulatory Visit: Payer: Self-pay

## 2017-03-01 ENCOUNTER — Ambulatory Visit (HOSPITAL_COMMUNITY)
Admission: RE | Admit: 2017-03-01 | Discharge: 2017-03-01 | Disposition: A | Discharge status: Home / Self Care | Payer: Medicare HMO | Source: Ambulatory Visit | Attending: Family | Admitting: Family

## 2017-03-01 VITALS — BP 141/72 | HR 110 | Temp 98.6°F | Resp 20 | Wt 176.0 lb

## 2017-03-01 DIAGNOSIS — Z09 Encounter for follow-up examination after completed treatment for conditions other than malignant neoplasm: Secondary | ICD-10-CM | POA: Diagnosis not present

## 2017-03-01 DIAGNOSIS — C7951 Secondary malignant neoplasm of bone: Secondary | ICD-10-CM | POA: Diagnosis not present

## 2017-03-01 DIAGNOSIS — I491 Atrial premature depolarization: Secondary | ICD-10-CM | POA: Diagnosis not present

## 2017-03-01 DIAGNOSIS — I2699 Other pulmonary embolism without acute cor pulmonale: Secondary | ICD-10-CM | POA: Diagnosis not present

## 2017-03-01 DIAGNOSIS — Z86711 Personal history of pulmonary embolism: Secondary | ICD-10-CM | POA: Insufficient documentation

## 2017-03-01 DIAGNOSIS — I82402 Acute embolism and thrombosis of unspecified deep veins of left lower extremity: Secondary | ICD-10-CM | POA: Diagnosis not present

## 2017-03-01 DIAGNOSIS — I82433 Acute embolism and thrombosis of popliteal vein, bilateral: Secondary | ICD-10-CM

## 2017-03-01 DIAGNOSIS — C787 Secondary malignant neoplasm of liver and intrahepatic bile duct: Secondary | ICD-10-CM

## 2017-03-01 DIAGNOSIS — R000 Tachycardia, unspecified: Secondary | ICD-10-CM | POA: Diagnosis not present

## 2017-03-01 DIAGNOSIS — I82432 Acute embolism and thrombosis of left popliteal vein: Secondary | ICD-10-CM | POA: Insufficient documentation

## 2017-03-01 DIAGNOSIS — Z720 Tobacco use: Secondary | ICD-10-CM

## 2017-03-01 DIAGNOSIS — I82403 Acute embolism and thrombosis of unspecified deep veins of lower extremity, bilateral: Secondary | ICD-10-CM | POA: Diagnosis not present

## 2017-03-01 DIAGNOSIS — C7800 Secondary malignant neoplasm of unspecified lung: Secondary | ICD-10-CM | POA: Insufficient documentation

## 2017-03-01 DIAGNOSIS — C50919 Malignant neoplasm of unspecified site of unspecified female breast: Secondary | ICD-10-CM

## 2017-03-01 DIAGNOSIS — I82411 Acute embolism and thrombosis of right femoral vein: Secondary | ICD-10-CM | POA: Insufficient documentation

## 2017-03-01 DIAGNOSIS — R600 Localized edema: Secondary | ICD-10-CM

## 2017-03-01 DIAGNOSIS — Z170 Estrogen receptor positive status [ER+]: Secondary | ICD-10-CM | POA: Insufficient documentation

## 2017-03-01 DIAGNOSIS — Z7901 Long term (current) use of anticoagulants: Secondary | ICD-10-CM | POA: Insufficient documentation

## 2017-03-01 DIAGNOSIS — M84422D Pathological fracture, left humerus, subsequent encounter for fracture with routine healing: Secondary | ICD-10-CM | POA: Diagnosis not present

## 2017-03-01 DIAGNOSIS — Z86718 Personal history of other venous thrombosis and embolism: Secondary | ICD-10-CM | POA: Insufficient documentation

## 2017-03-01 DIAGNOSIS — I82401 Acute embolism and thrombosis of unspecified deep veins of right lower extremity: Secondary | ICD-10-CM | POA: Diagnosis not present

## 2017-03-01 LAB — CBC WITH DIFFERENTIAL (CANCER CENTER ONLY)
BASO#: 0 10*3/uL (ref 0.0–0.2)
BASO%: 0.4 % (ref 0.0–2.0)
EOS%: 0.4 % (ref 0.0–7.0)
Eosinophils Absolute: 0 10*3/uL (ref 0.0–0.5)
HCT: 40.4 % (ref 34.8–46.6)
HEMOGLOBIN: 13.1 g/dL (ref 11.6–15.9)
LYMPH#: 0.9 10*3/uL (ref 0.9–3.3)
LYMPH%: 15.1 % (ref 14.0–48.0)
MCH: 32.2 pg (ref 26.0–34.0)
MCHC: 32.4 g/dL (ref 32.0–36.0)
MCV: 99 fL (ref 81–101)
MONO#: 0.5 10*3/uL (ref 0.1–0.9)
MONO%: 9.1 % (ref 0.0–13.0)
NEUT%: 75 % (ref 39.6–80.0)
NEUTROS ABS: 4.3 10*3/uL (ref 1.5–6.5)
PLATELETS: 288 10*3/uL (ref 145–400)
RBC: 4.07 10*6/uL (ref 3.70–5.32)
RDW: 17 % — ABNORMAL HIGH (ref 11.1–15.7)
WBC: 5.7 10*3/uL (ref 3.9–10.0)

## 2017-03-01 LAB — CMP (CANCER CENTER ONLY)
ALBUMIN: 3.4 g/dL (ref 3.3–5.5)
ALT(SGPT): 21 U/L (ref 10–47)
AST: 32 U/L (ref 11–38)
Alkaline Phosphatase: 96 U/L — ABNORMAL HIGH (ref 26–84)
BILIRUBIN TOTAL: 0.9 mg/dL (ref 0.20–1.60)
BUN: 15 mg/dL (ref 7–22)
CHLORIDE: 102 meq/L (ref 98–108)
CO2: 30 meq/L (ref 18–33)
CREATININE: 0.9 mg/dL (ref 0.6–1.2)
Calcium: 9.4 mg/dL (ref 8.0–10.3)
Glucose, Bld: 96 mg/dL (ref 73–118)
Potassium: 4.2 mEq/L (ref 3.3–4.7)
SODIUM: 142 meq/L (ref 128–145)
Total Protein: 7.4 g/dL (ref 6.4–8.1)

## 2017-03-01 MED ORDER — EXEMESTANE 25 MG PO TABS: 25.0000 mg | ORAL_TABLET | Freq: Every day | ORAL | 0 refills | Status: DC

## 2017-03-01 NOTE — Progress Notes (Signed)
Hematology and Oncology Follow Up Visit  Taylor Brown [id number] 1951-07-01 66 y.o. 03/01/2017   Principle Diagnosis:  Metastatic Breast Cancer - ER+/HER2- - bone/lung/liver metastasis Bilateral DVT and PEs   Current Therapy:   Aromasin 25 mg po q day Kisqali 600 mg PO daily (21/7) Zometa 4 mg IV q 6 weeks XRT to right humerus  Past Therapy: Verzenio 100 mg po BID - stopped on 02/16/2017   Interim History:  Taylor Brown is here today with her daughter for follow-up. She is taking her Aromasin daily as prescribed. Her Thelma Comp has not yet come in the mail but was due yesterday. We gave her a sample pack today.  No fever, chills, n/v, cough, rash, dizziness, chest pain, palpitations, abdominal pain or changes in bowel or bladder habits.  She is still smoking and we discussed how this increases her risk of DVT.  She is taking her Eliquis 5 mg PO BID as prescribed. No episodes of bleeding, bruising or petechiae.  No lymphadenopathy found on exam.  The swelling in her legs is slightly better. She still has pitting edema of both lower extremities. Pedal pulses were +2. Numbness and tingling in hands is unchanged. No c/o pain at this time.  She has a good appetite and states that she needs to drink more water. She likes Coke zero and will try to cut back on this.   ECOG Performance Status: 2 - Symptomatic, <50% confined to bed  Medications:  Allergies as of 03/01/2017      Reactions   Ativan [lorazepam] Other (See Comments)   Makes confused   No Known Allergies       Medication List       Accurate as of 03/01/17 12:05 PM. Always use your most recent med list.          bisacodyl 10 MG suppository Commonly known as:  DULCOLAX Place 1 suppository (10 mg total) rectally daily as needed for moderate constipation.   cyanocobalamin 1000 MCG tablet Take 1 tablet (1,000 mcg total) by mouth daily.   dextromethorphan-guaiFENesin 30-600 MG 12hr tablet Commonly known as:  MUCINEX DM Take  1 tablet by mouth 2 (two) times daily.   DSS 100 MG Caps Take 100 mg by mouth 2 (two) times daily.   esomeprazole 20 MG capsule Commonly known as:  NEXIUM Take 20 mg by mouth daily as needed (for acid reflux).   exemestane 25 MG tablet Commonly known as:  AROMASIN Take 1 tablet (25 mg total) by mouth daily after breakfast.   folic acid 1 MG tablet Commonly known as:  FOLVITE Take 1 tablet (1 mg total) by mouth daily.   ipratropium-albuterol 0.5-2.5 (3) MG/3ML Soln Commonly known as:  DUONEB Take 3 mLs by nebulization every 6 (six) hours as needed.   levofloxacin 750 MG tablet Commonly known as:  LEVAQUIN Take 1 tablet (750 mg total) by mouth daily.   loratadine 10 MG tablet Commonly known as:  CLARITIN Take 10 mg by mouth daily as needed for allergies.   methocarbamol 500 MG tablet Commonly known as:  ROBAXIN Take 1 tablet (500 mg total) by mouth every 6 (six) hours as needed for muscle spasms.   mometasone-formoterol 200-5 MCG/ACT Aero Commonly known as:  DULERA Inhale 2 puffs into the lungs 2 (two) times daily.   multivitamin with minerals Tabs tablet Take 1 tablet by mouth daily.   oxyCODONE 5 MG immediate release tablet Commonly known as:  Oxy IR/ROXICODONE Take 1 tablet (5 mg  total) by mouth every 6 (six) hours as needed.   pantoprazole 40 MG tablet Commonly known as:  PROTONIX Take 1 tablet (40 mg total) by mouth 2 (two) times daily before a meal.   polyethylene glycol packet Commonly known as:  MIRALAX / GLYCOLAX Take 17 g by mouth daily.   potassium chloride SA 20 MEQ tablet Commonly known as:  K-DUR,KLOR-CON Take 2 tablets (40 mEq total) by mouth daily.   predniSONE 20 MG tablet Commonly known as:  DELTASONE Take 0.5 tablets (10 mg total) by mouth daily with breakfast.   Ribociclib Succinate 200 MG Tabs Take 600 mg by mouth daily. Take 3 pills daily for 21 days, then off for 7 days.   senna 8.6 MG Tabs tablet Commonly known as:  SENOKOT Take 1  tablet (8.6 mg total) by mouth 2 (two) times daily.   spironolactone 50 MG tablet Commonly known as:  ALDACTONE Take 1 tablet (50 mg total) by mouth daily.   sucralfate 1 g tablet Commonly known as:  CARAFATE Take 1 tablet (1 g total) by mouth 4 (four) times daily -  with meals and at bedtime. 5 min before meals for radiation induced esophagitis   thiamine 100 MG tablet Take 1 tablet (100 mg total) by mouth daily.       Allergies:  Allergies  Allergen Reactions  . Ativan [Lorazepam] Other (See Comments)    Makes confused  . No Known Allergies     Past Medical History, Surgical history, Social history, and Family History were reviewed and updated.  Review of Systems: All other 10 point review of systems is negative.   Physical Exam:  vitals were not taken for this visit.  Wt Readings from Last 3 Encounters:  02/22/17 179 lb (81.2 kg)  01/25/17 181 lb (82.1 kg)  01/12/17 181 lb 3.2 oz (82.2 kg)    Ocular: Sclerae unicteric, pupils equal, round and reactive to light Ear-nose-throat: Oropharynx clear, dentition fair Lymphatic: No cervical, supraclavicular and axillary adenopathy Lungs no rales or rhonchi, good excursion bilaterally Heart regular rate and rhythm, no murmur appreciated Abd soft, nontender, positive bowel sounds, no liver or spleen tip palpated on exam, no fluid wave  MSK no focal spinal tenderness, no joint edema Neuro: non-focal, well-oriented, appropriate affect Breasts: Deferred   Lab Results  Component Value Date   WBC 5.7 03/01/2017   HGB 13.1 03/01/2017   HCT 40.4 03/01/2017   MCV 99 03/01/2017   PLT 288 03/01/2017   Lab Results  Component Value Date   FERRITIN 109 12/22/2016   IRON 42 12/22/2016   TIBC 346 12/22/2016   UIBC 304 12/22/2016   IRONPCTSAT 12 12/22/2016   Lab Results  Component Value Date   RETICCTPCT 4.0 (H) 12/22/2016   RBC 4.07 03/01/2017   No results found for: KPAFRELGTCHN, LAMBDASER, KAPLAMBRATIO No results  found for: IGGSERUM, IGA, IGMSERUM No results found for: Ronnald Ramp, A1GS, A2GS, Tillman Sers, SPEI   Chemistry      Component Value Date/Time   NA 142 02/22/2017 1152   K 3.7 02/22/2017 1152   CL 105 02/22/2017 1152   CO2 28 02/22/2017 1152   BUN 10 02/22/2017 1152   CREATININE 0.9 02/22/2017 1152      Component Value Date/Time   CALCIUM 9.2 02/22/2017 1152   ALKPHOS 81 02/22/2017 1152   AST 26 02/22/2017 1152   ALT 18 02/22/2017 1152   BILITOT 0.60 02/22/2017 1152      Impression and  Plan: Taylor Brown is a very pleasant 66 yo caucasian female with metastatic breast cancer. She developed bilateral PE's and DVT's on Verzinio which was then stopped. Repeat US of the legs is pending.  She will start Kisqali today (verbalized understanding of the 21/7 schedule) and will continue on Aromasin daily.  We got a baseline EKG which showed sinus tach with occasional PAC.  She will continue on the same regimen with Eliquis.   We will plan to see her back again in another 3 weeks for repeat lab work and follow-up. She will contact our office with any questions or concerns. We can certainly see her sooner if need be.    Eliezer Bottom, NP 8/22/201812:05 PM

## 2017-03-07 ENCOUNTER — Ambulatory Visit: Payer: Medicare HMO | Admitting: Hematology & Oncology

## 2017-03-07 ENCOUNTER — Other Ambulatory Visit: Payer: Medicare HMO

## 2017-03-07 ENCOUNTER — Ambulatory Visit: Payer: Medicare HMO

## 2017-03-10 ENCOUNTER — Ambulatory Visit: Admission: RE | Admit: 2017-03-10 | Payer: Medicare HMO | Source: Ambulatory Visit | Admitting: Urology

## 2017-03-10 ENCOUNTER — Inpatient Hospital Stay
Admission: RE | Admit: 2017-03-10 | Discharge: 2017-03-10 | Disposition: A | Discharge status: Home / Self Care | Payer: Medicare HMO | Source: Ambulatory Visit | Attending: Urology | Admitting: Urology

## 2017-03-14 ENCOUNTER — Other Ambulatory Visit: Payer: Self-pay | Admitting: *Deleted

## 2017-03-14 DIAGNOSIS — C7951 Secondary malignant neoplasm of bone: Principal | ICD-10-CM

## 2017-03-14 DIAGNOSIS — C50919 Malignant neoplasm of unspecified site of unspecified female breast: Secondary | ICD-10-CM

## 2017-03-14 MED ORDER — ELIQUIS 5 MG PO TABS: 5.0000 mg | ORAL_TABLET | Freq: Two times a day (BID) | ORAL | 6 refills | Status: DC

## 2017-03-16 ENCOUNTER — Other Ambulatory Visit: Payer: Self-pay

## 2017-03-16 ENCOUNTER — Other Ambulatory Visit (HOSPITAL_BASED_OUTPATIENT_CLINIC_OR_DEPARTMENT_OTHER): Payer: Medicare HMO

## 2017-03-16 ENCOUNTER — Telehealth: Payer: Self-pay | Admitting: Hematology & Oncology

## 2017-03-16 ENCOUNTER — Ambulatory Visit (HOSPITAL_BASED_OUTPATIENT_CLINIC_OR_DEPARTMENT_OTHER): Payer: Medicare HMO | Admitting: Hematology & Oncology

## 2017-03-16 ENCOUNTER — Ambulatory Visit (HOSPITAL_BASED_OUTPATIENT_CLINIC_OR_DEPARTMENT_OTHER): Payer: Medicare HMO

## 2017-03-16 VITALS — BP 103/72 | HR 80 | Temp 99.5°F

## 2017-03-16 DIAGNOSIS — C787 Secondary malignant neoplasm of liver and intrahepatic bile duct: Secondary | ICD-10-CM

## 2017-03-16 DIAGNOSIS — R609 Edema, unspecified: Secondary | ICD-10-CM | POA: Diagnosis not present

## 2017-03-16 DIAGNOSIS — E860 Dehydration: Secondary | ICD-10-CM | POA: Diagnosis not present

## 2017-03-16 DIAGNOSIS — C7800 Secondary malignant neoplasm of unspecified lung: Secondary | ICD-10-CM

## 2017-03-16 DIAGNOSIS — B3781 Candidal esophagitis: Secondary | ICD-10-CM

## 2017-03-16 DIAGNOSIS — B370 Candidal stomatitis: Secondary | ICD-10-CM

## 2017-03-16 DIAGNOSIS — R1310 Dysphagia, unspecified: Secondary | ICD-10-CM

## 2017-03-16 DIAGNOSIS — C7951 Secondary malignant neoplasm of bone: Principal | ICD-10-CM

## 2017-03-16 DIAGNOSIS — I749 Embolism and thrombosis of unspecified artery: Secondary | ICD-10-CM | POA: Diagnosis not present

## 2017-03-16 DIAGNOSIS — R600 Localized edema: Secondary | ICD-10-CM

## 2017-03-16 DIAGNOSIS — I2699 Other pulmonary embolism without acute cor pulmonale: Secondary | ICD-10-CM

## 2017-03-16 DIAGNOSIS — I82433 Acute embolism and thrombosis of popliteal vein, bilateral: Secondary | ICD-10-CM

## 2017-03-16 DIAGNOSIS — C50919 Malignant neoplasm of unspecified site of unspecified female breast: Secondary | ICD-10-CM

## 2017-03-16 DIAGNOSIS — I82419 Acute embolism and thrombosis of unspecified femoral vein: Secondary | ICD-10-CM

## 2017-03-16 LAB — CBC WITH DIFFERENTIAL (CANCER CENTER ONLY)
BASO#: 0 10*3/uL (ref 0.0–0.2)
BASO%: 0.4 % (ref 0.0–2.0)
EOS%: 1.1 % (ref 0.0–7.0)
Eosinophils Absolute: 0 10*3/uL (ref 0.0–0.5)
HEMATOCRIT: 36.6 % (ref 34.8–46.6)
HGB: 12.1 g/dL (ref 11.6–15.9)
LYMPH#: 0.5 10*3/uL — AB (ref 0.9–3.3)
LYMPH%: 17.3 % (ref 14.0–48.0)
MCH: 32.9 pg (ref 26.0–34.0)
MCHC: 33.1 g/dL (ref 32.0–36.0)
MCV: 100 fL (ref 81–101)
MONO#: 0.1 10*3/uL (ref 0.1–0.9)
MONO%: 3.2 % (ref 0.0–13.0)
NEUT#: 2.2 10*3/uL (ref 1.5–6.5)
NEUT%: 78 % (ref 39.6–80.0)
Platelets: 195 10*3/uL (ref 145–400)
RBC: 3.68 10*6/uL — ABNORMAL LOW (ref 3.70–5.32)
RDW: 19.3 % — AB (ref 11.1–15.7)
WBC: 2.8 10*3/uL — ABNORMAL LOW (ref 3.9–10.0)

## 2017-03-16 LAB — CMP (CANCER CENTER ONLY)
ALK PHOS: 84 U/L (ref 26–84)
ALT(SGPT): 29 U/L (ref 10–47)
AST: 28 U/L (ref 11–38)
Albumin: 3.3 g/dL (ref 3.3–5.5)
BILIRUBIN TOTAL: 0.8 mg/dL (ref 0.20–1.60)
BUN, Bld: 28 mg/dL — ABNORMAL HIGH (ref 7–22)
CALCIUM: 8.8 mg/dL (ref 8.0–10.3)
CO2: 26 meq/L (ref 18–33)
Chloride: 101 mEq/L (ref 98–108)
Creat: 1.6 mg/dl — ABNORMAL HIGH (ref 0.6–1.2)
GLUCOSE: 106 mg/dL (ref 73–118)
POTASSIUM: 5.5 meq/L — AB (ref 3.3–4.7)
Sodium: 138 mEq/L (ref 128–145)
Total Protein: 7 g/dL (ref 6.4–8.1)

## 2017-03-16 MED ORDER — ELIQUIS 5 MG PO TABS: 5.0000 mg | ORAL_TABLET | Freq: Two times a day (BID) | ORAL | 6 refills | Status: DC

## 2017-03-16 MED ORDER — FLUCONAZOLE 40 MG/ML PO SUSR: 200.0000 mg | Freq: Every day | ORAL | 0 refills | Status: AC

## 2017-03-16 MED ORDER — SPIRONOLACTONE 50 MG PO TABS: 50.0000 mg | ORAL_TABLET | Freq: Every day | ORAL | 3 refills | Status: DC

## 2017-03-16 MED ORDER — SODIUM CHLORIDE 0.9 % IV SOLN
500.0000 mL | Freq: Once | INTRAVENOUS | Status: DC
  Administered 2017-03-16: 250 mL via INTRAVENOUS

## 2017-03-16 MED ORDER — ELIQUIS 5 MG PO TABS: 5.0000 mg | ORAL_TABLET | Freq: Two times a day (BID) | ORAL | 6 refills | Status: AC

## 2017-03-16 MED ORDER — FLUCONAZOLE IN SODIUM CHLORIDE 400-0.9 MG/200ML-% IV SOLN
400.0000 mg | INTRAVENOUS | Status: DC
  Administered 2017-03-16: 400 mg via INTRAVENOUS
  Filled 2017-03-16: qty 200

## 2017-03-16 NOTE — Telephone Encounter (Signed)
Received fax from [facility] to have Dr. Marin Olp sign for diagnosis for patient. Faxed back.

## 2017-03-16 NOTE — Patient Instructions (Signed)
Oral Thrush, Adult Oral thrush is an infection in your mouth and throat. It causes white patches on your tongue and in your mouth. Follow these instructions at home: Helping with soreness  To lessen your pain: ? Drink cold liquids, like water and iced tea. ? Eat frozen ice pops or frozen juices. ? Eat foods that are easy to swallow, like gelatin and ice cream. ? Drink from a straw if the patches in your mouth are painful. General instructions   Take or use over-the-counter and prescription medicines only as told by your doctor. Medicine for oral thrush may be something to swallow, or it may be something to put on the infected area.  Eat plain yogurt that has live cultures in it. Read the label to make sure.  If you wear dentures: ? Take out your dentures before you go to bed. ? Brush them well. ? Soak them in a denture cleaner.  Rinse your mouth with warm salt-water many times a day. To make the salt-water mixture, completely dissolve 1/2-1 teaspoon of salt in 1 cup of warm water. Contact a doctor if:  Your problems are getting worse.  Your problems do not get better in less than 7 days with treatment.  Your infection is spreading. This may show as white patches on the skin outside of your mouth.  You are nursing your baby and you have redness and pain in the nipples. This information is not intended to replace advice given to you by your health care provider. Make sure you discuss any questions you have with your health care provider. Document Released: 09/21/2009 Document Revised: 03/21/2016 Document Reviewed: 03/21/2016 Elsevier Interactive Patient Education  2017 [place]. Fluconazole injection What is this medicine? FLUCONAZOLE (floo KON na zole) is an antifungal medicine. It is used to treat or prevent certain kinds of fungal or yeast infections. This medicine may be used for other purposes; ask your health care provider or pharmacist if you have questions. COMMON  BRAND NAME(S): Diflucan What should I tell my health care provider before I take this medicine? They need to know if you have any of these conditions: -history of irregular heart beat -kidney disease -an unusual or allergic reaction to fluconazole, other antifungal medicines, foods, dyes or preservatives -pregnant or trying to get pregnant -breast-feeding How should I use this medicine? This medicine is for injection into a vein. It is usually given by a health care professional in a hospital or clinic setting. If you get this medicine at home, you will be taught how to prepare and give this medicine. Use exactly as directed. Take your medicine at regular intervals. Do not take your medicine more often than directed. It is important that you put your used needles and syringes in a special sharps container. Do not put them in a trash can. If you do not have a sharps container, call your pharmacist or healthcare provider to get one. Talk to your pediatrician regarding the use of this medicine in children. Special care may be needed. Overdosage: If you think you have taken too much of this medicine contact a poison control center or emergency room at once. NOTE: This medicine is only for you. Do not share this medicine with others. What if I miss a dose? This does not apply. What may interact with this medicine? Do not take this medicine with any of the following medications: -astemizole -certain medicines for irregular heart beat like dofetilide, dronedarone, quinidine -cisapride -erythromycin -lomitapide -other medicines that prolong  the QT interval (cause an abnormal heart rhythm) -pimozide -terfenadine -thioridazine -tolvaptan -ziprasidone This medicine may also interact with the following medications: -antiviral medicines for HIV or AIDS -birth control pills -certain antibiotics like rifabutin, rifampin -certain medicines for blood pressure like amlodipine, isradipine, felodipine,  hydrochlorothiazide, losartan, nifedipine -certain medicines for cancer like cyclophosphamide, vinblastine, vincristine -certain medicines for cholesterol like atorvastatin, lovastatin, fluvastatin, simvastatin -certain medicines for depression, anxiety, or psychotic disturbances like amitriptyline, midazolam, nortriptyline, triazolam -certain medicines for diabetes like glipizide, glyburide, tolbutamide -certain medicines for pain like alfentanil, fentanyl, methadone -certain medicines for seizures like carbamazepine, phenytoin -certain medicines that treat or prevent blood clots like warfarin -halofantrine -medicines that lower your chance of fighting infection like cyclosporine, prednisone, tacrolimus -NSAIDS, medicines for pain and inflammation, like celecoxib, diclofenac, flurbiprofen, ibuprofen, meloxicam, naproxen -other medicines for fungal infections -sirolimus -theophylline -tofacitinib This list may not describe all possible interactions. Give your health care provider a list of all the medicines, herbs, non-prescription drugs, or dietary supplements you use. Also tell them if you smoke, drink alcohol, or use illegal drugs. Some items may interact with your medicine. What should I watch for while using this medicine? Tell your doctor if your symptoms do not improve. If you are taking this medicine for a long time you may need blood work. Some fungal infections need many weeks or months of treatment to cure completely. Alcohol can increase possible damage to your liver from this medicine. Avoid alcoholic drinks. What side effects may I notice from receiving this medicine? Side effects that you should report to your doctor or health care professional as soon as possible: -allergic reactions like skin rash or itching, hives, swelling of the lips, mouth, tongue, or throat -dark urine -feeling dizzy or faint -irregular heartbeat or chest pain -pain, redness at site of  injection -redness, blistering, peeling or loosening of the skin, including inside the mouth -stomach pain -trouble breathing -unusual bruising or bleeding -vomiting -yellowing of the eyes or skin Side effects that usually do not require medical attention (report to your doctor or health care professional if they continue or are bothersome): -changes in how food tastes -diarrhea -headache -stomach upset, nausea This list may not describe all possible side effects. Call your doctor for medical advice about side effects. You may report side effects to FDA at 1-800-FDA-1088. Where should I keep my medicine? Keep out of the reach of children. If you are using this medicine at home, you will be instructed on how to store this medicine. Throw away any unused medicine after the expiration date on the label. NOTE: This sheet is a summary. It may not cover all possible information. If you have questions about this medicine, talk to your doctor, pharmacist, or health care provider.  2018 Elsevier/Gold Standard (2013-02-02 19:33:55)

## 2017-03-16 NOTE — Progress Notes (Signed)
Hematology and Oncology Follow Up Visit  Taylor Brown [id number] 1950-12-16 66 y.o. 03/16/2017   Principle Diagnosis:  Metastatic Breast Cancer - ER+/HER2- - bone/lung/liver metastasis Bilateral DVT and PEs   Current Therapy:   Aromasin 25 mg po q day Kisqali 600 mg PO daily (21/7) Zometa 4 mg IV q 6 weeks XRT to right humerus ELIQUIS 5 mg by mouth twice a day  Past Therapy: Verzenio 100 mg po BID - stopped on 02/16/2017   Interim History:  Taylor Brown is here today with her daughter for follow-up. She now has dysphagia and odynophagia. I took a look in the back or throat. She has Candida. I'm sure this is a combination from her prednisone and her white cell been a little bit low.  She's not eating or drinking. She is taking potassium pills. Her potassium is 5.5 today. I will have to stop her potassium. She is also on Aldactone. I would like to keep her on the Aldactone as I think this does help with some of the edema in her legs.   Her last CA-27-29 3 weeks ago was 22. This is holding steady.  She's had no fever. There's been no diarrhea. She's had no bleeding.  There is no headache. She's had no visual changes.  Currently, her performance status is ECOG 2.   Medications:  Allergies as of 03/16/2017      Reactions   Ativan [lorazepam] Other (See Comments)   Makes confused   No Known Allergies       Medication List       Accurate as of 03/16/17  1:01 PM. Always use your most recent med list.          bisacodyl 10 MG suppository Commonly known as:  DULCOLAX Place 1 suppository (10 mg total) rectally daily as needed for moderate constipation.   cyanocobalamin 1000 MCG tablet Take 1 tablet (1,000 mcg total) by mouth daily.   dextromethorphan-guaiFENesin 30-600 MG 12hr tablet Commonly known as:  MUCINEX DM Take 1 tablet by mouth 2 (two) times daily.   DSS 100 MG Caps Take 100 mg by mouth 2 (two) times daily.   ELIQUIS 5 MG Tabs tablet Generic drug:  apixaban Take  1 tablet (5 mg total) by mouth 2 (two) times daily.   esomeprazole 20 MG capsule Commonly known as:  NEXIUM Take 20 mg by mouth daily as needed (for acid reflux).   exemestane 25 MG tablet Commonly known as:  AROMASIN Take 1 tablet (25 mg total) by mouth daily after breakfast.   fluconazole 40 MG/ML suspension Commonly known as:  DIFLUCAN Take 5 mLs (200 mg total) by mouth daily.   folic acid 1 MG tablet Commonly known as:  FOLVITE Take 1 tablet (1 mg total) by mouth daily.   ipratropium-albuterol 0.5-2.5 (3) MG/3ML Soln Commonly known as:  DUONEB Inhale into the lungs.   levofloxacin 750 MG tablet Commonly known as:  LEVAQUIN Take 1 tablet (750 mg total) by mouth daily.   loratadine 10 MG tablet Commonly known as:  CLARITIN Take 10 mg by mouth daily as needed for allergies.   methocarbamol 500 MG tablet Commonly known as:  ROBAXIN Take by mouth.   mometasone-formoterol 200-5 MCG/ACT Aero Commonly known as:  DULERA Inhale 2 puffs into the lungs 2 (two) times daily.   MULTI-VITAMINS Tabs Take by mouth.   multivitamin with minerals Tabs tablet Take 1 tablet by mouth daily.   oxyCODONE 5 MG immediate release tablet Commonly known  as:  Oxy IR/ROXICODONE Take 1 tablet (5 mg total) by mouth every 6 (six) hours as needed.   pantoprazole 40 MG tablet Commonly known as:  PROTONIX Take 1 tablet (40 mg total) by mouth 2 (two) times daily before a meal.   polyethylene glycol packet Commonly known as:  MIRALAX / GLYCOLAX Take 17 g by mouth daily.   Potassium Chloride ER 20 MEQ Tbcr Take by mouth.   potassium chloride SA 20 MEQ tablet Commonly known as:  K-DUR,KLOR-CON Take 2 tablets (40 mEq total) by mouth daily.   predniSONE 20 MG tablet Commonly known as:  DELTASONE Take 0.5 tablets (10 mg total) by mouth daily with breakfast.   Ribociclib Succinate 200 MG Tabs Take 600 mg by mouth daily. Take 3 pills daily for 21 days, then off for 7 days.   senna 8.6 MG  Tabs tablet Commonly known as:  SENOKOT Take 1 tablet (8.6 mg total) by mouth 2 (two) times daily.   spironolactone 50 MG tablet Commonly known as:  ALDACTONE Take 1 tablet (50 mg total) by mouth daily.   sucralfate 1 g tablet Commonly known as:  CARAFATE Take 1 tablet (1 g total) by mouth 4 (four) times daily -  with meals and at bedtime. 5 min before meals for radiation induced esophagitis   thiamine 100 MG tablet Take 1 tablet (100 mg total) by mouth daily.   traZODone 50 MG tablet Commonly known as:  DESYREL Take by mouth.   VERZENIO 100 MG Tabs Generic drug:  Abemaciclib            Discharge Care Instructions        Start     Ordered   03/16/17 0000  fluconazole (DIFLUCAN) 40 MG/ML suspension  Daily     03/16/17 1301      Allergies:  Allergies  Allergen Reactions  . Ativan [Lorazepam] Other (See Comments)    Makes confused  . No Known Allergies     Past Medical History, Surgical history, Social history, and Family History were reviewed and updated.  Review of Systems: All other 10 point review of systems is negative.   Physical Exam:  oral temperature is 99.5 F (37.5 C). Her blood pressure is 103/72 and her pulse is 80.   Wt Readings from Last 3 Encounters:  03/01/17 176 lb (79.8 kg)  02/22/17 179 lb (81.2 kg)  01/25/17 181 lb (82.1 kg)    Obese white female. Oral exam shows pharyngeal exudates consistent with candidiasis. She has no adenopathy in the neck. Lungs are with decreased of the bases. Cardiac exam regular rate and rhythm. Shows no murmurs. Axillary exam shows Candida in the right axilla. Abdomen is soft but obese. She has decent bowel sounds. There is no guarding or rebound tenderness. She does have some candidiasis in her skin folds. Extremities shows improved edema. She still has 2+ pitting edema in the legs. She has some stasis dermatitis changes in the legs. Neurological exam shows no focal neurological deficits.  Lab Results    Component Value Date   WBC 2.8 (L) 03/16/2017   HGB 12.1 03/16/2017   HCT 36.6 03/16/2017   MCV 100 03/16/2017   PLT 195 03/16/2017   Lab Results  Component Value Date   FERRITIN 109 12/22/2016   IRON 42 12/22/2016   TIBC 346 12/22/2016   UIBC 304 12/22/2016   IRONPCTSAT 12 12/22/2016   Lab Results  Component Value Date   RETICCTPCT 4.0 (H) 12/22/2016  RBC 3.68 (L) 03/16/2017   No results found for: KPAFRELGTCHN, LAMBDASER, KAPLAMBRATIO No results found for: IGGSERUM, IGA, IGMSERUM No results found for: Odetta Pink, SPEI   Chemistry      Component Value Date/Time   NA 138 03/16/2017 1154   K 5.5 (H) 03/16/2017 1154   CL 101 03/16/2017 1154   CO2 26 03/16/2017 1154   BUN 28 (H) 03/16/2017 1154   CREATININE 1.6 (H) 03/16/2017 1154      Component Value Date/Time   CALCIUM 8.8 03/16/2017 1154   ALKPHOS 84 03/16/2017 1154   AST 28 03/16/2017 1154   ALT 29 03/16/2017 1154   BILITOT 0.80 03/16/2017 1154      Impression and Plan: Ms. Engdahl is a very pleasant 66 yo caucasian female with metastatic breast cancer.   Thankfully, her legs look better. I have to believe that the blood clots are resolving. She is on ELIQUIS and will stay on ELIQUIS. I will have to refill the ELIQUIS.  It looks like that she has bad thrush. I'm sure this is from prednisone. I the week and probably stop the prednisone.  She is dehydrated. She needs IV fluids. I will give her IV Diflucan. I will then give her oral Diflucan for 7 days.  She has a hard time swallowing right now. She can hold the Ribociclib.  Hopefully, she will continue to improve. At some point, we will have to repeat her vascular studies to look at the pulmonary emboli and thromboembolic disease.  I spent about 30 minutes with she and her daughter. This was a little bit more involved than I had anticipated.  She will come back in 4 weeks. Volanda Napoleon, MD 9/6/20181:01  PM

## 2017-03-17 LAB — CANCER ANTIGEN 27.29: CAN 27.29: 28.1 U/mL (ref 0.0–38.6)

## 2017-03-21 ENCOUNTER — Telehealth: Payer: Self-pay | Admitting: Hematology & Oncology

## 2017-03-21 NOTE — Telephone Encounter (Addendum)
Oral Oncology Patient Advocate Encounter  Working to get patients manufacture assistance. Sent over paper work to [employer] at Dr. Antonieta Pert office to get signed by patient and Dr. Marin Olp.   Called patient this morning to ask her to go by and take tax forms and sign at Dr. Antonieta Pert office.  Roselyn Reef will fax to me and I will send off to company.   This encounter will be updated until final determination.   [place] Patient Advocate [phone] 03/21/2017 10:19 AM

## 2017-03-21 NOTE — Telephone Encounter (Signed)
Oral Oncology Patient Advocate Encounter  Was successful in securing patient a $4000.00 grant from [employer] to provide copayment coverage for KisQali.  This will keep the out of pocket expense at $0.    I have left a message for the patient.    The billing information is as follows and has been shared with [facility].   Member ID: [id number] Group ID: [id number] RxBin: [id number]

## 2017-03-24 ENCOUNTER — Ambulatory Visit: Payer: Medicare HMO | Admitting: Hematology & Oncology

## 2017-03-24 ENCOUNTER — Other Ambulatory Visit: Payer: Medicare HMO

## 2017-03-29 MED FILL — KISQALI 600 MG DAILY DOSE: 200 | 28 days supply | Qty: 63 | Fill #1

## 2017-04-03 ENCOUNTER — Other Ambulatory Visit: Payer: Self-pay | Admitting: Family

## 2017-04-03 DIAGNOSIS — C50919 Malignant neoplasm of unspecified site of unspecified female breast: Secondary | ICD-10-CM | POA: Diagnosis not present

## 2017-04-03 DIAGNOSIS — K219 Gastro-esophageal reflux disease without esophagitis: Secondary | ICD-10-CM | POA: Diagnosis not present

## 2017-04-03 DIAGNOSIS — I2609 Other pulmonary embolism with acute cor pulmonale: Secondary | ICD-10-CM | POA: Diagnosis not present

## 2017-04-03 DIAGNOSIS — R600 Localized edema: Secondary | ICD-10-CM | POA: Diagnosis not present

## 2017-04-03 DIAGNOSIS — L97919 Non-pressure chronic ulcer of unspecified part of right lower leg with unspecified severity: Secondary | ICD-10-CM | POA: Diagnosis not present

## 2017-04-03 DIAGNOSIS — C7951 Secondary malignant neoplasm of bone: Secondary | ICD-10-CM

## 2017-04-03 DIAGNOSIS — I83019 Varicose veins of right lower extremity with ulcer of unspecified site: Secondary | ICD-10-CM | POA: Diagnosis not present

## 2017-04-05 ENCOUNTER — Telehealth: Payer: Self-pay | Admitting: Hematology & Oncology

## 2017-04-05 NOTE — Telephone Encounter (Signed)
Oral Oncology Patient Advocate Encounter   Patient called back. I told her call Medicare LIS [phone] to complete application. She is going to call them, and will let me know if she needs any help. When we get the approval or denial from LIS I will forward to [employer].   [facility] Patient Advocate [phone] 04/05/2017 3:22 PM

## 2017-04-05 NOTE — Telephone Encounter (Signed)
Oral Oncology Patient Advocate Encounter  Received notification from [employer] Patient Assistance program that patient has been successfully enrolled into their program to receive KISQALI from the manufacturer at $0 out of pocket for 3 months.  Patient has to complete a application for Medicare Part D LIS. [phone]  Depending on the approval or denial they may continue to supply her with her medication.    I  L/M for patient to call me.  She knows we will have to re-apply.   Patient knows to call the office with questions or concerns.  Oral Oncology Clinic will continue to follow.  [place] Patient Advocate [phone] 04/05/2017 1:16 PM

## 2017-04-12 DIAGNOSIS — I87331 Chronic venous hypertension (idiopathic) with ulcer and inflammation of right lower extremity: Secondary | ICD-10-CM | POA: Diagnosis not present

## 2017-04-12 DIAGNOSIS — S81802D Unspecified open wound, left lower leg, subsequent encounter: Secondary | ICD-10-CM | POA: Diagnosis not present

## 2017-04-12 DIAGNOSIS — L97919 Non-pressure chronic ulcer of unspecified part of right lower leg with unspecified severity: Secondary | ICD-10-CM | POA: Diagnosis not present

## 2017-04-12 DIAGNOSIS — X58XXXD Exposure to other specified factors, subsequent encounter: Secondary | ICD-10-CM | POA: Diagnosis not present

## 2017-04-12 DIAGNOSIS — I872 Venous insufficiency (chronic) (peripheral): Secondary | ICD-10-CM | POA: Diagnosis not present

## 2017-04-12 DIAGNOSIS — L97221 Non-pressure chronic ulcer of left calf limited to breakdown of skin: Secondary | ICD-10-CM | POA: Diagnosis not present

## 2017-04-12 DIAGNOSIS — I83019 Varicose veins of right lower extremity with ulcer of unspecified site: Secondary | ICD-10-CM | POA: Diagnosis not present

## 2017-04-12 DIAGNOSIS — I890 Lymphedema, not elsewhere classified: Secondary | ICD-10-CM | POA: Diagnosis not present

## 2017-04-14 NOTE — Telephone Encounter (Signed)
Oral Oncology Patient Advocate Encounter  Did application for Medicare LIS for patient. Spoke with daughter to let her know. Waiting on denial or approval to send to [employer].   [place] Patient Advocate [phone] 04/14/2017 11:08 AM

## 2017-04-18 ENCOUNTER — Encounter: Payer: Self-pay | Admitting: Family

## 2017-04-25 DIAGNOSIS — C7951 Secondary malignant neoplasm of bone: Secondary | ICD-10-CM | POA: Diagnosis not present

## 2017-04-25 DIAGNOSIS — C50912 Malignant neoplasm of unspecified site of left female breast: Secondary | ICD-10-CM | POA: Diagnosis not present

## 2017-04-25 DIAGNOSIS — I2699 Other pulmonary embolism without acute cor pulmonale: Secondary | ICD-10-CM | POA: Diagnosis not present

## 2017-05-08 DIAGNOSIS — G250 Essential tremor: Secondary | ICD-10-CM | POA: Diagnosis not present

## 2017-05-08 DIAGNOSIS — I872 Venous insufficiency (chronic) (peripheral): Secondary | ICD-10-CM | POA: Diagnosis not present

## 2017-05-08 DIAGNOSIS — L97221 Non-pressure chronic ulcer of left calf limited to breakdown of skin: Secondary | ICD-10-CM | POA: Diagnosis not present

## 2017-05-08 DIAGNOSIS — Z23 Encounter for immunization: Secondary | ICD-10-CM | POA: Diagnosis not present

## 2017-05-09 NOTE — Telephone Encounter (Signed)
Oral Oncology Patient Advocate Encounter  Patients LIS was denied. Daughter to fax over paper work.    [place] Patient Advocate [phone] 05/09/2017 4:38 PM

## 2017-05-10 NOTE — Telephone Encounter (Signed)
Oral Oncology Patient Advocate Encounter  Patients daughter faxed over denial from LIS Medicare. I faxed to [employer] per there request.    [facility] Patient Advocate [phone] 05/10/2017 11:11 AM

## 2017-05-11 DIAGNOSIS — B3781 Candidal esophagitis: Secondary | ICD-10-CM | POA: Diagnosis not present

## 2017-05-11 DIAGNOSIS — G250 Essential tremor: Secondary | ICD-10-CM | POA: Diagnosis not present

## 2017-05-11 DIAGNOSIS — B370 Candidal stomatitis: Secondary | ICD-10-CM | POA: Diagnosis not present

## 2017-05-12 DIAGNOSIS — I890 Lymphedema, not elsewhere classified: Secondary | ICD-10-CM | POA: Diagnosis not present

## 2017-05-12 DIAGNOSIS — I87331 Chronic venous hypertension (idiopathic) with ulcer and inflammation of right lower extremity: Secondary | ICD-10-CM | POA: Diagnosis not present

## 2017-05-12 DIAGNOSIS — X58XXXD Exposure to other specified factors, subsequent encounter: Secondary | ICD-10-CM | POA: Diagnosis not present

## 2017-05-12 DIAGNOSIS — L97211 Non-pressure chronic ulcer of right calf limited to breakdown of skin: Secondary | ICD-10-CM | POA: Diagnosis not present

## 2017-05-12 DIAGNOSIS — L97919 Non-pressure chronic ulcer of unspecified part of right lower leg with unspecified severity: Secondary | ICD-10-CM | POA: Diagnosis not present

## 2017-05-12 DIAGNOSIS — S81802D Unspecified open wound, left lower leg, subsequent encounter: Secondary | ICD-10-CM | POA: Diagnosis not present

## 2017-05-12 DIAGNOSIS — L97221 Non-pressure chronic ulcer of left calf limited to breakdown of skin: Secondary | ICD-10-CM | POA: Diagnosis not present

## 2017-05-12 DIAGNOSIS — I872 Venous insufficiency (chronic) (peripheral): Secondary | ICD-10-CM | POA: Diagnosis not present

## 2017-05-16 DIAGNOSIS — I87331 Chronic venous hypertension (idiopathic) with ulcer and inflammation of right lower extremity: Secondary | ICD-10-CM | POA: Diagnosis not present

## 2017-05-24 DIAGNOSIS — I872 Venous insufficiency (chronic) (peripheral): Secondary | ICD-10-CM | POA: Diagnosis not present

## 2017-05-24 DIAGNOSIS — L97221 Non-pressure chronic ulcer of left calf limited to breakdown of skin: Secondary | ICD-10-CM | POA: Diagnosis not present

## 2017-05-24 DIAGNOSIS — L97211 Non-pressure chronic ulcer of right calf limited to breakdown of skin: Secondary | ICD-10-CM | POA: Diagnosis not present

## 2017-05-30 DIAGNOSIS — C7951 Secondary malignant neoplasm of bone: Secondary | ICD-10-CM | POA: Diagnosis not present

## 2017-05-30 DIAGNOSIS — C50912 Malignant neoplasm of unspecified site of left female breast: Secondary | ICD-10-CM | POA: Diagnosis not present

## 2017-06-07 ENCOUNTER — Other Ambulatory Visit: Payer: Self-pay | Admitting: Hematology & Oncology

## 2017-06-07 DIAGNOSIS — I82433 Acute embolism and thrombosis of popliteal vein, bilateral: Secondary | ICD-10-CM

## 2017-06-07 DIAGNOSIS — C50919 Malignant neoplasm of unspecified site of unspecified female breast: Secondary | ICD-10-CM

## 2017-06-07 DIAGNOSIS — C7951 Secondary malignant neoplasm of bone: Principal | ICD-10-CM

## 2017-06-07 DIAGNOSIS — R600 Localized edema: Secondary | ICD-10-CM

## 2017-06-12 DIAGNOSIS — S81802D Unspecified open wound, left lower leg, subsequent encounter: Secondary | ICD-10-CM | POA: Diagnosis not present

## 2017-06-12 DIAGNOSIS — L97919 Non-pressure chronic ulcer of unspecified part of right lower leg with unspecified severity: Secondary | ICD-10-CM | POA: Diagnosis not present

## 2017-06-12 DIAGNOSIS — X58XXXD Exposure to other specified factors, subsequent encounter: Secondary | ICD-10-CM | POA: Diagnosis not present

## 2017-06-12 DIAGNOSIS — I890 Lymphedema, not elsewhere classified: Secondary | ICD-10-CM | POA: Diagnosis not present

## 2017-06-12 DIAGNOSIS — I87331 Chronic venous hypertension (idiopathic) with ulcer and inflammation of right lower extremity: Secondary | ICD-10-CM | POA: Diagnosis not present

## 2017-06-13 DIAGNOSIS — I739 Peripheral vascular disease, unspecified: Secondary | ICD-10-CM | POA: Diagnosis not present

## 2017-06-13 DIAGNOSIS — M79675 Pain in left toe(s): Secondary | ICD-10-CM | POA: Diagnosis not present

## 2017-06-13 DIAGNOSIS — L600 Ingrowing nail: Secondary | ICD-10-CM | POA: Diagnosis not present

## 2017-06-13 DIAGNOSIS — M79674 Pain in right toe(s): Secondary | ICD-10-CM | POA: Diagnosis not present

## 2017-06-13 DIAGNOSIS — B351 Tinea unguium: Secondary | ICD-10-CM | POA: Diagnosis not present

## 2017-06-14 DIAGNOSIS — I87331 Chronic venous hypertension (idiopathic) with ulcer and inflammation of right lower extremity: Secondary | ICD-10-CM | POA: Diagnosis not present

## 2017-06-14 DIAGNOSIS — I890 Lymphedema, not elsewhere classified: Secondary | ICD-10-CM | POA: Diagnosis not present

## 2017-06-14 DIAGNOSIS — X58XXXD Exposure to other specified factors, subsequent encounter: Secondary | ICD-10-CM | POA: Diagnosis not present

## 2017-06-14 DIAGNOSIS — S81802D Unspecified open wound, left lower leg, subsequent encounter: Secondary | ICD-10-CM | POA: Diagnosis not present

## 2017-06-14 DIAGNOSIS — L97919 Non-pressure chronic ulcer of unspecified part of right lower leg with unspecified severity: Secondary | ICD-10-CM | POA: Diagnosis not present

## 2017-06-16 DIAGNOSIS — I890 Lymphedema, not elsewhere classified: Secondary | ICD-10-CM | POA: Diagnosis not present

## 2017-06-16 DIAGNOSIS — X58XXXD Exposure to other specified factors, subsequent encounter: Secondary | ICD-10-CM | POA: Diagnosis not present

## 2017-06-16 DIAGNOSIS — L97919 Non-pressure chronic ulcer of unspecified part of right lower leg with unspecified severity: Secondary | ICD-10-CM | POA: Diagnosis not present

## 2017-06-16 DIAGNOSIS — I87331 Chronic venous hypertension (idiopathic) with ulcer and inflammation of right lower extremity: Secondary | ICD-10-CM | POA: Diagnosis not present

## 2017-06-16 DIAGNOSIS — S81802D Unspecified open wound, left lower leg, subsequent encounter: Secondary | ICD-10-CM | POA: Diagnosis not present

## 2017-06-21 DIAGNOSIS — I890 Lymphedema, not elsewhere classified: Secondary | ICD-10-CM | POA: Diagnosis not present

## 2017-06-21 DIAGNOSIS — S81802D Unspecified open wound, left lower leg, subsequent encounter: Secondary | ICD-10-CM | POA: Diagnosis not present

## 2017-06-21 DIAGNOSIS — I87331 Chronic venous hypertension (idiopathic) with ulcer and inflammation of right lower extremity: Secondary | ICD-10-CM | POA: Diagnosis not present

## 2017-06-21 DIAGNOSIS — X58XXXD Exposure to other specified factors, subsequent encounter: Secondary | ICD-10-CM | POA: Diagnosis not present

## 2017-06-21 DIAGNOSIS — L97919 Non-pressure chronic ulcer of unspecified part of right lower leg with unspecified severity: Secondary | ICD-10-CM | POA: Diagnosis not present

## 2017-06-22 DIAGNOSIS — R1312 Dysphagia, oropharyngeal phase: Secondary | ICD-10-CM | POA: Diagnosis not present

## 2017-06-23 DIAGNOSIS — X58XXXD Exposure to other specified factors, subsequent encounter: Secondary | ICD-10-CM | POA: Diagnosis not present

## 2017-06-23 DIAGNOSIS — I87331 Chronic venous hypertension (idiopathic) with ulcer and inflammation of right lower extremity: Secondary | ICD-10-CM | POA: Diagnosis not present

## 2017-06-23 DIAGNOSIS — I890 Lymphedema, not elsewhere classified: Secondary | ICD-10-CM | POA: Diagnosis not present

## 2017-06-23 DIAGNOSIS — L97919 Non-pressure chronic ulcer of unspecified part of right lower leg with unspecified severity: Secondary | ICD-10-CM | POA: Diagnosis not present

## 2017-06-23 DIAGNOSIS — S81802D Unspecified open wound, left lower leg, subsequent encounter: Secondary | ICD-10-CM | POA: Diagnosis not present

## 2017-06-26 DIAGNOSIS — X58XXXD Exposure to other specified factors, subsequent encounter: Secondary | ICD-10-CM | POA: Diagnosis not present

## 2017-06-26 DIAGNOSIS — S81802D Unspecified open wound, left lower leg, subsequent encounter: Secondary | ICD-10-CM | POA: Diagnosis not present

## 2017-06-26 DIAGNOSIS — L97919 Non-pressure chronic ulcer of unspecified part of right lower leg with unspecified severity: Secondary | ICD-10-CM | POA: Diagnosis not present

## 2017-06-26 DIAGNOSIS — I87331 Chronic venous hypertension (idiopathic) with ulcer and inflammation of right lower extremity: Secondary | ICD-10-CM | POA: Diagnosis not present

## 2017-06-26 DIAGNOSIS — I890 Lymphedema, not elsewhere classified: Secondary | ICD-10-CM | POA: Diagnosis not present

## 2017-06-28 DIAGNOSIS — L97919 Non-pressure chronic ulcer of unspecified part of right lower leg with unspecified severity: Secondary | ICD-10-CM | POA: Diagnosis not present

## 2017-06-28 DIAGNOSIS — I87331 Chronic venous hypertension (idiopathic) with ulcer and inflammation of right lower extremity: Secondary | ICD-10-CM | POA: Diagnosis not present

## 2017-06-28 DIAGNOSIS — I890 Lymphedema, not elsewhere classified: Secondary | ICD-10-CM | POA: Diagnosis not present

## 2017-06-28 DIAGNOSIS — S81802D Unspecified open wound, left lower leg, subsequent encounter: Secondary | ICD-10-CM | POA: Diagnosis not present

## 2017-06-28 DIAGNOSIS — X58XXXD Exposure to other specified factors, subsequent encounter: Secondary | ICD-10-CM | POA: Diagnosis not present

## 2017-06-29 DIAGNOSIS — C7951 Secondary malignant neoplasm of bone: Secondary | ICD-10-CM | POA: Diagnosis not present

## 2017-06-29 DIAGNOSIS — R918 Other nonspecific abnormal finding of lung field: Secondary | ICD-10-CM | POA: Diagnosis not present

## 2017-06-29 DIAGNOSIS — C50912 Malignant neoplasm of unspecified site of left female breast: Secondary | ICD-10-CM | POA: Diagnosis not present

## 2017-06-30 DIAGNOSIS — S81802D Unspecified open wound, left lower leg, subsequent encounter: Secondary | ICD-10-CM | POA: Diagnosis not present

## 2017-06-30 DIAGNOSIS — I890 Lymphedema, not elsewhere classified: Secondary | ICD-10-CM | POA: Diagnosis not present

## 2017-06-30 DIAGNOSIS — I87331 Chronic venous hypertension (idiopathic) with ulcer and inflammation of right lower extremity: Secondary | ICD-10-CM | POA: Diagnosis not present

## 2017-06-30 DIAGNOSIS — L97919 Non-pressure chronic ulcer of unspecified part of right lower leg with unspecified severity: Secondary | ICD-10-CM | POA: Diagnosis not present

## 2017-06-30 DIAGNOSIS — X58XXXD Exposure to other specified factors, subsequent encounter: Secondary | ICD-10-CM | POA: Diagnosis not present

## 2017-07-03 DIAGNOSIS — I890 Lymphedema, not elsewhere classified: Secondary | ICD-10-CM | POA: Diagnosis not present

## 2017-07-03 DIAGNOSIS — I87331 Chronic venous hypertension (idiopathic) with ulcer and inflammation of right lower extremity: Secondary | ICD-10-CM | POA: Diagnosis not present

## 2017-07-03 DIAGNOSIS — S81802D Unspecified open wound, left lower leg, subsequent encounter: Secondary | ICD-10-CM | POA: Diagnosis not present

## 2017-07-03 DIAGNOSIS — L97919 Non-pressure chronic ulcer of unspecified part of right lower leg with unspecified severity: Secondary | ICD-10-CM | POA: Diagnosis not present

## 2017-07-03 DIAGNOSIS — X58XXXD Exposure to other specified factors, subsequent encounter: Secondary | ICD-10-CM | POA: Diagnosis not present

## 2017-07-06 DIAGNOSIS — X58XXXD Exposure to other specified factors, subsequent encounter: Secondary | ICD-10-CM | POA: Diagnosis not present

## 2017-07-06 DIAGNOSIS — L97919 Non-pressure chronic ulcer of unspecified part of right lower leg with unspecified severity: Secondary | ICD-10-CM | POA: Diagnosis not present

## 2017-07-06 DIAGNOSIS — S81802D Unspecified open wound, left lower leg, subsequent encounter: Secondary | ICD-10-CM | POA: Diagnosis not present

## 2017-07-06 DIAGNOSIS — I890 Lymphedema, not elsewhere classified: Secondary | ICD-10-CM | POA: Diagnosis not present

## 2017-07-06 DIAGNOSIS — I87331 Chronic venous hypertension (idiopathic) with ulcer and inflammation of right lower extremity: Secondary | ICD-10-CM | POA: Diagnosis not present

## 2017-07-10 DIAGNOSIS — X58XXXD Exposure to other specified factors, subsequent encounter: Secondary | ICD-10-CM | POA: Diagnosis not present

## 2017-07-10 DIAGNOSIS — I890 Lymphedema, not elsewhere classified: Secondary | ICD-10-CM | POA: Diagnosis not present

## 2017-07-10 DIAGNOSIS — L97919 Non-pressure chronic ulcer of unspecified part of right lower leg with unspecified severity: Secondary | ICD-10-CM | POA: Diagnosis not present

## 2017-07-10 DIAGNOSIS — S81802D Unspecified open wound, left lower leg, subsequent encounter: Secondary | ICD-10-CM | POA: Diagnosis not present

## 2017-07-10 DIAGNOSIS — I87331 Chronic venous hypertension (idiopathic) with ulcer and inflammation of right lower extremity: Secondary | ICD-10-CM | POA: Diagnosis not present

## 2017-07-13 DIAGNOSIS — I87331 Chronic venous hypertension (idiopathic) with ulcer and inflammation of right lower extremity: Secondary | ICD-10-CM | POA: Diagnosis not present

## 2017-07-13 DIAGNOSIS — I890 Lymphedema, not elsewhere classified: Secondary | ICD-10-CM | POA: Diagnosis not present

## 2017-07-13 DIAGNOSIS — X58XXXD Exposure to other specified factors, subsequent encounter: Secondary | ICD-10-CM | POA: Diagnosis not present

## 2017-07-13 DIAGNOSIS — L97919 Non-pressure chronic ulcer of unspecified part of right lower leg with unspecified severity: Secondary | ICD-10-CM | POA: Diagnosis not present

## 2017-07-13 DIAGNOSIS — S81802D Unspecified open wound, left lower leg, subsequent encounter: Secondary | ICD-10-CM | POA: Diagnosis not present

## 2017-07-14 DIAGNOSIS — J449 Chronic obstructive pulmonary disease, unspecified: Secondary | ICD-10-CM | POA: Diagnosis not present

## 2017-07-14 DIAGNOSIS — R531 Weakness: Secondary | ICD-10-CM | POA: Diagnosis not present

## 2017-07-14 DIAGNOSIS — R2681 Unsteadiness on feet: Secondary | ICD-10-CM | POA: Diagnosis not present

## 2017-07-14 DIAGNOSIS — R9431 Abnormal electrocardiogram [ECG] [EKG]: Secondary | ICD-10-CM | POA: Diagnosis not present

## 2017-07-14 DIAGNOSIS — C50919 Malignant neoplasm of unspecified site of unspecified female breast: Secondary | ICD-10-CM | POA: Diagnosis not present

## 2017-07-14 DIAGNOSIS — R2689 Other abnormalities of gait and mobility: Secondary | ICD-10-CM | POA: Diagnosis not present

## 2017-07-14 DIAGNOSIS — R600 Localized edema: Secondary | ICD-10-CM | POA: Diagnosis not present

## 2017-07-14 DIAGNOSIS — R609 Edema, unspecified: Secondary | ICD-10-CM | POA: Diagnosis not present

## 2017-07-15 DIAGNOSIS — Z66 Do not resuscitate: Secondary | ICD-10-CM | POA: Diagnosis not present

## 2017-07-15 DIAGNOSIS — C50912 Malignant neoplasm of unspecified site of left female breast: Secondary | ICD-10-CM | POA: Diagnosis not present

## 2017-07-15 DIAGNOSIS — G4700 Insomnia, unspecified: Secondary | ICD-10-CM | POA: Diagnosis not present

## 2017-07-15 DIAGNOSIS — G250 Essential tremor: Secondary | ICD-10-CM | POA: Diagnosis not present

## 2017-07-15 DIAGNOSIS — Z170 Estrogen receptor positive status [ER+]: Secondary | ICD-10-CM | POA: Diagnosis not present

## 2017-07-15 DIAGNOSIS — R2681 Unsteadiness on feet: Secondary | ICD-10-CM | POA: Diagnosis not present

## 2017-07-15 DIAGNOSIS — R1312 Dysphagia, oropharyngeal phase: Secondary | ICD-10-CM | POA: Diagnosis not present

## 2017-07-15 DIAGNOSIS — G893 Neoplasm related pain (acute) (chronic): Secondary | ICD-10-CM | POA: Diagnosis not present

## 2017-07-15 DIAGNOSIS — I2699 Other pulmonary embolism without acute cor pulmonale: Secondary | ICD-10-CM | POA: Diagnosis not present

## 2017-07-15 DIAGNOSIS — I824Z1 Acute embolism and thrombosis of unspecified deep veins of right distal lower extremity: Secondary | ICD-10-CM | POA: Diagnosis not present

## 2017-07-15 DIAGNOSIS — C50919 Malignant neoplasm of unspecified site of unspecified female breast: Secondary | ICD-10-CM | POA: Diagnosis not present

## 2017-07-15 DIAGNOSIS — R627 Adult failure to thrive: Secondary | ICD-10-CM | POA: Diagnosis not present

## 2017-07-15 DIAGNOSIS — R531 Weakness: Secondary | ICD-10-CM | POA: Diagnosis not present

## 2017-07-15 DIAGNOSIS — R609 Edema, unspecified: Secondary | ICD-10-CM | POA: Diagnosis not present

## 2017-07-15 DIAGNOSIS — K31811 Angiodysplasia of stomach and duodenum with bleeding: Secondary | ICD-10-CM | POA: Diagnosis not present

## 2017-07-15 DIAGNOSIS — R69 Illness, unspecified: Secondary | ICD-10-CM | POA: Diagnosis not present

## 2017-07-15 DIAGNOSIS — K219 Gastro-esophageal reflux disease without esophagitis: Secondary | ICD-10-CM | POA: Diagnosis not present

## 2017-07-15 DIAGNOSIS — K921 Melena: Secondary | ICD-10-CM | POA: Diagnosis not present

## 2017-07-15 DIAGNOSIS — R2689 Other abnormalities of gait and mobility: Secondary | ICD-10-CM | POA: Diagnosis not present

## 2017-07-15 DIAGNOSIS — R600 Localized edema: Secondary | ICD-10-CM | POA: Diagnosis not present

## 2017-07-15 DIAGNOSIS — Z86718 Personal history of other venous thrombosis and embolism: Secondary | ICD-10-CM | POA: Diagnosis not present

## 2017-07-15 DIAGNOSIS — B3781 Candidal esophagitis: Secondary | ICD-10-CM | POA: Diagnosis not present

## 2017-07-15 DIAGNOSIS — L97929 Non-pressure chronic ulcer of unspecified part of left lower leg with unspecified severity: Secondary | ICD-10-CM | POA: Diagnosis not present

## 2017-07-15 DIAGNOSIS — E43 Unspecified severe protein-calorie malnutrition: Secondary | ICD-10-CM | POA: Diagnosis not present

## 2017-07-15 DIAGNOSIS — R9431 Abnormal electrocardiogram [ECG] [EKG]: Secondary | ICD-10-CM | POA: Diagnosis not present

## 2017-07-15 DIAGNOSIS — D62 Acute posthemorrhagic anemia: Secondary | ICD-10-CM | POA: Diagnosis not present

## 2017-07-15 DIAGNOSIS — K922 Gastrointestinal hemorrhage, unspecified: Secondary | ICD-10-CM | POA: Diagnosis not present

## 2017-07-15 DIAGNOSIS — J449 Chronic obstructive pulmonary disease, unspecified: Secondary | ICD-10-CM | POA: Diagnosis not present

## 2017-07-15 DIAGNOSIS — B370 Candidal stomatitis: Secondary | ICD-10-CM | POA: Diagnosis not present

## 2017-07-15 DIAGNOSIS — C7951 Secondary malignant neoplasm of bone: Secondary | ICD-10-CM | POA: Diagnosis not present

## 2017-07-15 DIAGNOSIS — L97211 Non-pressure chronic ulcer of right calf limited to breakdown of skin: Secondary | ICD-10-CM | POA: Diagnosis not present

## 2017-07-21 DIAGNOSIS — E43 Unspecified severe protein-calorie malnutrition: Secondary | ICD-10-CM | POA: Diagnosis not present

## 2017-07-21 DIAGNOSIS — I872 Venous insufficiency (chronic) (peripheral): Secondary | ICD-10-CM | POA: Diagnosis not present

## 2017-07-21 DIAGNOSIS — I824Z1 Acute embolism and thrombosis of unspecified deep veins of right distal lower extremity: Secondary | ICD-10-CM | POA: Diagnosis not present

## 2017-07-21 DIAGNOSIS — R1312 Dysphagia, oropharyngeal phase: Secondary | ICD-10-CM | POA: Diagnosis not present

## 2017-07-21 DIAGNOSIS — J449 Chronic obstructive pulmonary disease, unspecified: Secondary | ICD-10-CM | POA: Diagnosis not present

## 2017-07-21 DIAGNOSIS — K31819 Angiodysplasia of stomach and duodenum without bleeding: Secondary | ICD-10-CM | POA: Diagnosis not present

## 2017-07-21 DIAGNOSIS — C50912 Malignant neoplasm of unspecified site of left female breast: Secondary | ICD-10-CM | POA: Diagnosis not present

## 2017-07-21 DIAGNOSIS — R69 Illness, unspecified: Secondary | ICD-10-CM | POA: Diagnosis not present

## 2017-07-21 DIAGNOSIS — C7951 Secondary malignant neoplasm of bone: Secondary | ICD-10-CM | POA: Diagnosis not present

## 2017-07-21 DIAGNOSIS — L97211 Non-pressure chronic ulcer of right calf limited to breakdown of skin: Secondary | ICD-10-CM | POA: Diagnosis not present

## 2017-07-22 DIAGNOSIS — R69 Illness, unspecified: Secondary | ICD-10-CM | POA: Diagnosis not present

## 2017-07-22 DIAGNOSIS — G4700 Insomnia, unspecified: Secondary | ICD-10-CM | POA: Diagnosis not present

## 2017-07-22 DIAGNOSIS — I358 Other nonrheumatic aortic valve disorders: Secondary | ICD-10-CM | POA: Diagnosis not present

## 2017-07-22 DIAGNOSIS — I959 Hypotension, unspecified: Secondary | ICD-10-CM | POA: Diagnosis not present

## 2017-07-22 DIAGNOSIS — Z86718 Personal history of other venous thrombosis and embolism: Secondary | ICD-10-CM | POA: Diagnosis not present

## 2017-07-22 DIAGNOSIS — N3001 Acute cystitis with hematuria: Secondary | ICD-10-CM | POA: Diagnosis not present

## 2017-07-22 DIAGNOSIS — R000 Tachycardia, unspecified: Secondary | ICD-10-CM | POA: Diagnosis not present

## 2017-07-22 DIAGNOSIS — C7970 Secondary malignant neoplasm of unspecified adrenal gland: Secondary | ICD-10-CM | POA: Diagnosis not present

## 2017-07-22 DIAGNOSIS — R509 Fever, unspecified: Secondary | ICD-10-CM | POA: Diagnosis not present

## 2017-07-22 DIAGNOSIS — R6520 Severe sepsis without septic shock: Secondary | ICD-10-CM | POA: Diagnosis not present

## 2017-07-22 DIAGNOSIS — R6521 Severe sepsis with septic shock: Secondary | ICD-10-CM | POA: Diagnosis not present

## 2017-07-22 DIAGNOSIS — C7951 Secondary malignant neoplasm of bone: Secondary | ICD-10-CM | POA: Diagnosis not present

## 2017-07-22 DIAGNOSIS — J449 Chronic obstructive pulmonary disease, unspecified: Secondary | ICD-10-CM | POA: Diagnosis not present

## 2017-07-22 DIAGNOSIS — A4151 Sepsis due to Escherichia coli [E. coli]: Secondary | ICD-10-CM | POA: Diagnosis not present

## 2017-07-22 DIAGNOSIS — N390 Urinary tract infection, site not specified: Secondary | ICD-10-CM | POA: Diagnosis not present

## 2017-07-22 DIAGNOSIS — K31819 Angiodysplasia of stomach and duodenum without bleeding: Secondary | ICD-10-CM | POA: Diagnosis not present

## 2017-07-22 DIAGNOSIS — C50919 Malignant neoplasm of unspecified site of unspecified female breast: Secondary | ICD-10-CM | POA: Diagnosis not present

## 2017-07-22 DIAGNOSIS — K7200 Acute and subacute hepatic failure without coma: Secondary | ICD-10-CM | POA: Diagnosis not present

## 2017-07-22 DIAGNOSIS — Z853 Personal history of malignant neoplasm of breast: Secondary | ICD-10-CM | POA: Diagnosis not present

## 2017-07-22 DIAGNOSIS — K922 Gastrointestinal hemorrhage, unspecified: Secondary | ICD-10-CM | POA: Diagnosis not present

## 2017-07-22 DIAGNOSIS — Z66 Do not resuscitate: Secondary | ICD-10-CM | POA: Diagnosis not present

## 2017-07-22 DIAGNOSIS — K219 Gastro-esophageal reflux disease without esophagitis: Secondary | ICD-10-CM | POA: Diagnosis not present

## 2017-07-22 DIAGNOSIS — R627 Adult failure to thrive: Secondary | ICD-10-CM | POA: Diagnosis not present

## 2017-07-22 DIAGNOSIS — C787 Secondary malignant neoplasm of liver and intrahepatic bile duct: Secondary | ICD-10-CM | POA: Diagnosis not present

## 2017-07-22 DIAGNOSIS — N3000 Acute cystitis without hematuria: Secondary | ICD-10-CM | POA: Diagnosis not present

## 2017-07-22 DIAGNOSIS — K31811 Angiodysplasia of stomach and duodenum with bleeding: Secondary | ICD-10-CM | POA: Diagnosis not present

## 2017-07-22 DIAGNOSIS — K921 Melena: Secondary | ICD-10-CM | POA: Diagnosis not present

## 2017-07-22 DIAGNOSIS — A419 Sepsis, unspecified organism: Secondary | ICD-10-CM | POA: Diagnosis not present

## 2017-07-22 DIAGNOSIS — R110 Nausea: Secondary | ICD-10-CM | POA: Diagnosis not present

## 2017-07-22 DIAGNOSIS — L03116 Cellulitis of left lower limb: Secondary | ICD-10-CM | POA: Diagnosis not present

## 2017-07-22 DIAGNOSIS — I2609 Other pulmonary embolism with acute cor pulmonale: Secondary | ICD-10-CM | POA: Diagnosis not present

## 2017-08-01 DIAGNOSIS — R2681 Unsteadiness on feet: Secondary | ICD-10-CM | POA: Diagnosis not present

## 2017-08-01 DIAGNOSIS — R531 Weakness: Secondary | ICD-10-CM | POA: Diagnosis not present

## 2017-08-01 DIAGNOSIS — G250 Essential tremor: Secondary | ICD-10-CM | POA: Diagnosis not present

## 2017-08-03 DIAGNOSIS — K922 Gastrointestinal hemorrhage, unspecified: Secondary | ICD-10-CM | POA: Diagnosis not present

## 2017-08-03 DIAGNOSIS — N390 Urinary tract infection, site not specified: Secondary | ICD-10-CM | POA: Diagnosis not present

## 2017-08-03 DIAGNOSIS — R197 Diarrhea, unspecified: Secondary | ICD-10-CM | POA: Diagnosis not present

## 2017-08-03 DIAGNOSIS — A4150 Gram-negative sepsis, unspecified: Secondary | ICD-10-CM | POA: Diagnosis not present

## 2017-08-03 DIAGNOSIS — I2609 Other pulmonary embolism with acute cor pulmonale: Secondary | ICD-10-CM | POA: Diagnosis not present

## 2017-08-03 DIAGNOSIS — I959 Hypotension, unspecified: Secondary | ICD-10-CM | POA: Diagnosis not present

## 2017-08-03 DIAGNOSIS — D649 Anemia, unspecified: Secondary | ICD-10-CM | POA: Diagnosis not present

## 2017-08-03 DIAGNOSIS — J441 Chronic obstructive pulmonary disease with (acute) exacerbation: Secondary | ICD-10-CM | POA: Diagnosis not present

## 2017-08-03 DIAGNOSIS — I872 Venous insufficiency (chronic) (peripheral): Secondary | ICD-10-CM | POA: Diagnosis not present

## 2017-08-03 DIAGNOSIS — C50919 Malignant neoplasm of unspecified site of unspecified female breast: Secondary | ICD-10-CM | POA: Diagnosis not present

## 2017-08-04 DIAGNOSIS — C7951 Secondary malignant neoplasm of bone: Secondary | ICD-10-CM | POA: Diagnosis not present

## 2017-08-04 DIAGNOSIS — R600 Localized edema: Secondary | ICD-10-CM | POA: Diagnosis not present

## 2017-08-04 DIAGNOSIS — R69 Illness, unspecified: Secondary | ICD-10-CM | POA: Diagnosis not present

## 2017-08-04 DIAGNOSIS — D500 Iron deficiency anemia secondary to blood loss (chronic): Secondary | ICD-10-CM | POA: Diagnosis not present

## 2017-08-04 DIAGNOSIS — Z66 Do not resuscitate: Secondary | ICD-10-CM | POA: Diagnosis not present

## 2017-08-04 DIAGNOSIS — E860 Dehydration: Secondary | ICD-10-CM | POA: Diagnosis not present

## 2017-08-04 DIAGNOSIS — C50919 Malignant neoplasm of unspecified site of unspecified female breast: Secondary | ICD-10-CM | POA: Diagnosis not present

## 2017-08-04 DIAGNOSIS — Z7901 Long term (current) use of anticoagulants: Secondary | ICD-10-CM | POA: Diagnosis not present

## 2017-08-04 DIAGNOSIS — J449 Chronic obstructive pulmonary disease, unspecified: Secondary | ICD-10-CM | POA: Diagnosis not present

## 2017-08-04 DIAGNOSIS — R000 Tachycardia, unspecified: Secondary | ICD-10-CM | POA: Diagnosis not present

## 2017-08-04 DIAGNOSIS — M79604 Pain in right leg: Secondary | ICD-10-CM | POA: Diagnosis not present

## 2017-08-10 DIAGNOSIS — D500 Iron deficiency anemia secondary to blood loss (chronic): Secondary | ICD-10-CM | POA: Diagnosis not present

## 2017-08-10 DIAGNOSIS — A4150 Gram-negative sepsis, unspecified: Secondary | ICD-10-CM | POA: Diagnosis not present

## 2017-08-10 DIAGNOSIS — I890 Lymphedema, not elsewhere classified: Secondary | ICD-10-CM | POA: Diagnosis not present

## 2017-08-10 DIAGNOSIS — I2609 Other pulmonary embolism with acute cor pulmonale: Secondary | ICD-10-CM | POA: Diagnosis not present

## 2017-08-10 DIAGNOSIS — N390 Urinary tract infection, site not specified: Secondary | ICD-10-CM | POA: Diagnosis not present

## 2017-08-11 DIAGNOSIS — R69 Illness, unspecified: Secondary | ICD-10-CM | POA: Diagnosis not present

## 2017-08-12 DIAGNOSIS — R69 Illness, unspecified: Secondary | ICD-10-CM | POA: Diagnosis not present

## 2017-08-12 DIAGNOSIS — J449 Chronic obstructive pulmonary disease, unspecified: Secondary | ICD-10-CM | POA: Diagnosis not present

## 2017-08-12 DIAGNOSIS — Z87440 Personal history of urinary (tract) infections: Secondary | ICD-10-CM | POA: Diagnosis not present

## 2017-08-12 DIAGNOSIS — E43 Unspecified severe protein-calorie malnutrition: Secondary | ICD-10-CM | POA: Diagnosis not present

## 2017-08-12 DIAGNOSIS — J441 Chronic obstructive pulmonary disease with (acute) exacerbation: Secondary | ICD-10-CM | POA: Diagnosis not present

## 2017-08-12 DIAGNOSIS — A419 Sepsis, unspecified organism: Secondary | ICD-10-CM | POA: Diagnosis not present

## 2017-08-12 DIAGNOSIS — Z86718 Personal history of other venous thrombosis and embolism: Secondary | ICD-10-CM | POA: Diagnosis not present

## 2017-08-12 DIAGNOSIS — Z66 Do not resuscitate: Secondary | ICD-10-CM | POA: Diagnosis not present

## 2017-08-12 DIAGNOSIS — E46 Unspecified protein-calorie malnutrition: Secondary | ICD-10-CM | POA: Diagnosis not present

## 2017-08-12 DIAGNOSIS — G4700 Insomnia, unspecified: Secondary | ICD-10-CM | POA: Diagnosis not present

## 2017-08-12 DIAGNOSIS — C7951 Secondary malignant neoplasm of bone: Secondary | ICD-10-CM | POA: Diagnosis not present

## 2017-08-12 DIAGNOSIS — I890 Lymphedema, not elsewhere classified: Secondary | ICD-10-CM | POA: Diagnosis not present

## 2017-08-12 DIAGNOSIS — N390 Urinary tract infection, site not specified: Secondary | ICD-10-CM | POA: Diagnosis not present

## 2017-08-12 DIAGNOSIS — I2699 Other pulmonary embolism without acute cor pulmonale: Secondary | ICD-10-CM | POA: Diagnosis not present

## 2017-08-12 DIAGNOSIS — I959 Hypotension, unspecified: Secondary | ICD-10-CM | POA: Diagnosis not present

## 2017-08-12 DIAGNOSIS — G2581 Restless legs syndrome: Secondary | ICD-10-CM | POA: Diagnosis not present

## 2017-08-12 DIAGNOSIS — C50919 Malignant neoplasm of unspecified site of unspecified female breast: Secondary | ICD-10-CM | POA: Diagnosis not present

## 2017-08-14 DIAGNOSIS — J441 Chronic obstructive pulmonary disease with (acute) exacerbation: Secondary | ICD-10-CM | POA: Diagnosis not present

## 2017-08-14 DIAGNOSIS — C7951 Secondary malignant neoplasm of bone: Secondary | ICD-10-CM | POA: Diagnosis not present

## 2017-08-14 DIAGNOSIS — I890 Lymphedema, not elsewhere classified: Secondary | ICD-10-CM | POA: Diagnosis not present

## 2017-08-14 DIAGNOSIS — G2581 Restless legs syndrome: Secondary | ICD-10-CM | POA: Diagnosis not present

## 2017-08-14 DIAGNOSIS — E43 Unspecified severe protein-calorie malnutrition: Secondary | ICD-10-CM | POA: Diagnosis not present

## 2017-08-14 DIAGNOSIS — I2699 Other pulmonary embolism without acute cor pulmonale: Secondary | ICD-10-CM | POA: Diagnosis not present

## 2017-08-14 DIAGNOSIS — C50919 Malignant neoplasm of unspecified site of unspecified female breast: Secondary | ICD-10-CM | POA: Diagnosis not present

## 2017-08-14 DIAGNOSIS — Z86718 Personal history of other venous thrombosis and embolism: Secondary | ICD-10-CM | POA: Diagnosis not present

## 2017-08-14 DIAGNOSIS — I959 Hypotension, unspecified: Secondary | ICD-10-CM | POA: Diagnosis not present

## 2017-08-14 DIAGNOSIS — Z87440 Personal history of urinary (tract) infections: Secondary | ICD-10-CM | POA: Diagnosis not present

## 2017-08-17 DIAGNOSIS — R600 Localized edema: Secondary | ICD-10-CM | POA: Diagnosis not present

## 2017-08-17 DIAGNOSIS — I824Z1 Acute embolism and thrombosis of unspecified deep veins of right distal lower extremity: Secondary | ICD-10-CM | POA: Diagnosis not present

## 2017-08-17 DIAGNOSIS — L03116 Cellulitis of left lower limb: Secondary | ICD-10-CM | POA: Diagnosis not present

## 2017-08-17 DIAGNOSIS — I2609 Other pulmonary embolism with acute cor pulmonale: Secondary | ICD-10-CM | POA: Diagnosis not present

## 2017-08-17 DIAGNOSIS — I890 Lymphedema, not elsewhere classified: Secondary | ICD-10-CM | POA: Diagnosis not present

## 2017-08-17 DIAGNOSIS — I872 Venous insufficiency (chronic) (peripheral): Secondary | ICD-10-CM | POA: Diagnosis not present

## 2017-08-17 DIAGNOSIS — L97211 Non-pressure chronic ulcer of right calf limited to breakdown of skin: Secondary | ICD-10-CM | POA: Diagnosis not present

## 2017-08-17 DIAGNOSIS — L97221 Non-pressure chronic ulcer of left calf limited to breakdown of skin: Secondary | ICD-10-CM | POA: Diagnosis not present

## 2017-08-18 DIAGNOSIS — I890 Lymphedema, not elsewhere classified: Secondary | ICD-10-CM | POA: Diagnosis not present

## 2017-08-18 DIAGNOSIS — Z8619 Personal history of other infectious and parasitic diseases: Secondary | ICD-10-CM | POA: Diagnosis not present

## 2017-08-18 DIAGNOSIS — D500 Iron deficiency anemia secondary to blood loss (chronic): Secondary | ICD-10-CM | POA: Diagnosis not present

## 2017-08-18 DIAGNOSIS — R748 Abnormal levels of other serum enzymes: Secondary | ICD-10-CM | POA: Diagnosis not present

## 2017-08-19 DIAGNOSIS — Z86718 Personal history of other venous thrombosis and embolism: Secondary | ICD-10-CM | POA: Diagnosis not present

## 2017-08-19 DIAGNOSIS — C7951 Secondary malignant neoplasm of bone: Secondary | ICD-10-CM | POA: Diagnosis not present

## 2017-08-19 DIAGNOSIS — G2581 Restless legs syndrome: Secondary | ICD-10-CM | POA: Diagnosis not present

## 2017-08-19 DIAGNOSIS — C50919 Malignant neoplasm of unspecified site of unspecified female breast: Secondary | ICD-10-CM | POA: Diagnosis not present

## 2017-08-19 DIAGNOSIS — I959 Hypotension, unspecified: Secondary | ICD-10-CM | POA: Diagnosis not present

## 2017-08-19 DIAGNOSIS — I2699 Other pulmonary embolism without acute cor pulmonale: Secondary | ICD-10-CM | POA: Diagnosis not present

## 2017-08-19 DIAGNOSIS — E43 Unspecified severe protein-calorie malnutrition: Secondary | ICD-10-CM | POA: Diagnosis not present

## 2017-08-19 DIAGNOSIS — I890 Lymphedema, not elsewhere classified: Secondary | ICD-10-CM | POA: Diagnosis not present

## 2017-08-19 DIAGNOSIS — Z87440 Personal history of urinary (tract) infections: Secondary | ICD-10-CM | POA: Diagnosis not present

## 2017-08-19 DIAGNOSIS — J441 Chronic obstructive pulmonary disease with (acute) exacerbation: Secondary | ICD-10-CM | POA: Diagnosis not present

## 2017-08-21 DIAGNOSIS — J441 Chronic obstructive pulmonary disease with (acute) exacerbation: Secondary | ICD-10-CM | POA: Diagnosis not present

## 2017-08-21 DIAGNOSIS — E43 Unspecified severe protein-calorie malnutrition: Secondary | ICD-10-CM | POA: Diagnosis not present

## 2017-08-21 DIAGNOSIS — C7951 Secondary malignant neoplasm of bone: Secondary | ICD-10-CM | POA: Diagnosis not present

## 2017-08-21 DIAGNOSIS — Z86718 Personal history of other venous thrombosis and embolism: Secondary | ICD-10-CM | POA: Diagnosis not present

## 2017-08-21 DIAGNOSIS — G2581 Restless legs syndrome: Secondary | ICD-10-CM | POA: Diagnosis not present

## 2017-08-21 DIAGNOSIS — Z87440 Personal history of urinary (tract) infections: Secondary | ICD-10-CM | POA: Diagnosis not present

## 2017-08-21 DIAGNOSIS — I890 Lymphedema, not elsewhere classified: Secondary | ICD-10-CM | POA: Diagnosis not present

## 2017-08-21 DIAGNOSIS — I2699 Other pulmonary embolism without acute cor pulmonale: Secondary | ICD-10-CM | POA: Diagnosis not present

## 2017-08-21 DIAGNOSIS — C50919 Malignant neoplasm of unspecified site of unspecified female breast: Secondary | ICD-10-CM | POA: Diagnosis not present

## 2017-08-21 DIAGNOSIS — I959 Hypotension, unspecified: Secondary | ICD-10-CM | POA: Diagnosis not present

## 2017-08-23 DIAGNOSIS — I824Z1 Acute embolism and thrombosis of unspecified deep veins of right distal lower extremity: Secondary | ICD-10-CM | POA: Diagnosis not present

## 2017-08-23 DIAGNOSIS — X58XXXD Exposure to other specified factors, subsequent encounter: Secondary | ICD-10-CM | POA: Diagnosis not present

## 2017-08-25 DIAGNOSIS — Z87440 Personal history of urinary (tract) infections: Secondary | ICD-10-CM | POA: Diagnosis not present

## 2017-08-25 DIAGNOSIS — I2699 Other pulmonary embolism without acute cor pulmonale: Secondary | ICD-10-CM | POA: Diagnosis not present

## 2017-08-25 DIAGNOSIS — I890 Lymphedema, not elsewhere classified: Secondary | ICD-10-CM | POA: Diagnosis not present

## 2017-08-25 DIAGNOSIS — E43 Unspecified severe protein-calorie malnutrition: Secondary | ICD-10-CM | POA: Diagnosis not present

## 2017-08-25 DIAGNOSIS — Z86718 Personal history of other venous thrombosis and embolism: Secondary | ICD-10-CM | POA: Diagnosis not present

## 2017-08-25 DIAGNOSIS — C7951 Secondary malignant neoplasm of bone: Secondary | ICD-10-CM | POA: Diagnosis not present

## 2017-08-25 DIAGNOSIS — G2581 Restless legs syndrome: Secondary | ICD-10-CM | POA: Diagnosis not present

## 2017-08-25 DIAGNOSIS — J441 Chronic obstructive pulmonary disease with (acute) exacerbation: Secondary | ICD-10-CM | POA: Diagnosis not present

## 2017-08-25 DIAGNOSIS — I959 Hypotension, unspecified: Secondary | ICD-10-CM | POA: Diagnosis not present

## 2017-08-25 DIAGNOSIS — C50919 Malignant neoplasm of unspecified site of unspecified female breast: Secondary | ICD-10-CM | POA: Diagnosis not present

## 2017-08-28 DIAGNOSIS — Z87440 Personal history of urinary (tract) infections: Secondary | ICD-10-CM | POA: Diagnosis not present

## 2017-08-28 DIAGNOSIS — J441 Chronic obstructive pulmonary disease with (acute) exacerbation: Secondary | ICD-10-CM | POA: Diagnosis not present

## 2017-08-28 DIAGNOSIS — C50919 Malignant neoplasm of unspecified site of unspecified female breast: Secondary | ICD-10-CM | POA: Diagnosis not present

## 2017-08-28 DIAGNOSIS — I2699 Other pulmonary embolism without acute cor pulmonale: Secondary | ICD-10-CM | POA: Diagnosis not present

## 2017-08-28 DIAGNOSIS — I890 Lymphedema, not elsewhere classified: Secondary | ICD-10-CM | POA: Diagnosis not present

## 2017-08-28 DIAGNOSIS — C7951 Secondary malignant neoplasm of bone: Secondary | ICD-10-CM | POA: Diagnosis not present

## 2017-08-28 DIAGNOSIS — E43 Unspecified severe protein-calorie malnutrition: Secondary | ICD-10-CM | POA: Diagnosis not present

## 2017-08-28 DIAGNOSIS — I959 Hypotension, unspecified: Secondary | ICD-10-CM | POA: Diagnosis not present

## 2017-08-28 DIAGNOSIS — Z86718 Personal history of other venous thrombosis and embolism: Secondary | ICD-10-CM | POA: Diagnosis not present

## 2017-08-28 DIAGNOSIS — G2581 Restless legs syndrome: Secondary | ICD-10-CM | POA: Diagnosis not present

## 2017-08-29 DIAGNOSIS — K31819 Angiodysplasia of stomach and duodenum without bleeding: Secondary | ICD-10-CM | POA: Diagnosis not present

## 2017-08-29 DIAGNOSIS — I890 Lymphedema, not elsewhere classified: Secondary | ICD-10-CM | POA: Diagnosis not present

## 2017-08-29 DIAGNOSIS — D62 Acute posthemorrhagic anemia: Secondary | ICD-10-CM | POA: Diagnosis not present

## 2017-08-29 DIAGNOSIS — G250 Essential tremor: Secondary | ICD-10-CM | POA: Diagnosis not present

## 2017-08-29 DIAGNOSIS — Z6841 Body Mass Index (BMI) 40.0 and over, adult: Secondary | ICD-10-CM | POA: Diagnosis not present

## 2017-08-29 DIAGNOSIS — I8291 Chronic embolism and thrombosis of unspecified vein: Secondary | ICD-10-CM | POA: Diagnosis not present

## 2017-08-29 DIAGNOSIS — R300 Dysuria: Secondary | ICD-10-CM | POA: Diagnosis not present

## 2017-08-29 DIAGNOSIS — K921 Melena: Secondary | ICD-10-CM | POA: Diagnosis not present

## 2017-08-29 DIAGNOSIS — Z66 Do not resuscitate: Secondary | ICD-10-CM | POA: Diagnosis not present

## 2017-08-29 DIAGNOSIS — L97919 Non-pressure chronic ulcer of unspecified part of right lower leg with unspecified severity: Secondary | ICD-10-CM | POA: Diagnosis not present

## 2017-08-29 DIAGNOSIS — I2699 Other pulmonary embolism without acute cor pulmonale: Secondary | ICD-10-CM | POA: Diagnosis not present

## 2017-08-29 DIAGNOSIS — C50912 Malignant neoplasm of unspecified site of left female breast: Secondary | ICD-10-CM | POA: Diagnosis not present

## 2017-08-29 DIAGNOSIS — D500 Iron deficiency anemia secondary to blood loss (chronic): Secondary | ICD-10-CM | POA: Diagnosis not present

## 2017-08-29 DIAGNOSIS — R2681 Unsteadiness on feet: Secondary | ICD-10-CM | POA: Diagnosis not present

## 2017-08-29 DIAGNOSIS — Z86718 Personal history of other venous thrombosis and embolism: Secondary | ICD-10-CM | POA: Diagnosis not present

## 2017-08-29 DIAGNOSIS — Z7901 Long term (current) use of anticoagulants: Secondary | ICD-10-CM | POA: Diagnosis not present

## 2017-08-29 DIAGNOSIS — K922 Gastrointestinal hemorrhage, unspecified: Secondary | ICD-10-CM | POA: Diagnosis not present

## 2017-08-29 DIAGNOSIS — R531 Weakness: Secondary | ICD-10-CM | POA: Diagnosis not present

## 2017-08-29 DIAGNOSIS — C7951 Secondary malignant neoplasm of bone: Secondary | ICD-10-CM | POA: Diagnosis not present

## 2017-08-29 DIAGNOSIS — I517 Cardiomegaly: Secondary | ICD-10-CM | POA: Diagnosis not present

## 2017-08-29 DIAGNOSIS — K254 Chronic or unspecified gastric ulcer with hemorrhage: Secondary | ICD-10-CM | POA: Diagnosis not present

## 2017-08-29 DIAGNOSIS — D509 Iron deficiency anemia, unspecified: Secondary | ICD-10-CM | POA: Diagnosis not present

## 2017-08-30 DIAGNOSIS — D62 Acute posthemorrhagic anemia: Secondary | ICD-10-CM | POA: Diagnosis not present

## 2017-08-30 DIAGNOSIS — K922 Gastrointestinal hemorrhage, unspecified: Secondary | ICD-10-CM | POA: Diagnosis not present

## 2017-08-30 DIAGNOSIS — D509 Iron deficiency anemia, unspecified: Secondary | ICD-10-CM | POA: Diagnosis not present

## 2017-08-30 DIAGNOSIS — K921 Melena: Secondary | ICD-10-CM | POA: Diagnosis not present

## 2017-08-30 DIAGNOSIS — I8291 Chronic embolism and thrombosis of unspecified vein: Secondary | ICD-10-CM | POA: Diagnosis not present

## 2017-08-30 DIAGNOSIS — I2699 Other pulmonary embolism without acute cor pulmonale: Secondary | ICD-10-CM | POA: Diagnosis not present

## 2017-08-30 DIAGNOSIS — K31819 Angiodysplasia of stomach and duodenum without bleeding: Secondary | ICD-10-CM | POA: Diagnosis not present

## 2017-08-31 DIAGNOSIS — K922 Gastrointestinal hemorrhage, unspecified: Secondary | ICD-10-CM | POA: Diagnosis not present

## 2017-08-31 DIAGNOSIS — I2699 Other pulmonary embolism without acute cor pulmonale: Secondary | ICD-10-CM | POA: Diagnosis not present

## 2017-08-31 DIAGNOSIS — L97919 Non-pressure chronic ulcer of unspecified part of right lower leg with unspecified severity: Secondary | ICD-10-CM | POA: Diagnosis not present

## 2017-08-31 DIAGNOSIS — I517 Cardiomegaly: Secondary | ICD-10-CM | POA: Diagnosis not present

## 2017-08-31 DIAGNOSIS — C50912 Malignant neoplasm of unspecified site of left female breast: Secondary | ICD-10-CM | POA: Diagnosis not present

## 2017-08-31 DIAGNOSIS — K31819 Angiodysplasia of stomach and duodenum without bleeding: Secondary | ICD-10-CM | POA: Diagnosis not present

## 2017-08-31 DIAGNOSIS — D62 Acute posthemorrhagic anemia: Secondary | ICD-10-CM | POA: Diagnosis not present

## 2017-08-31 DIAGNOSIS — C7951 Secondary malignant neoplasm of bone: Secondary | ICD-10-CM | POA: Diagnosis not present

## 2017-08-31 DIAGNOSIS — K921 Melena: Secondary | ICD-10-CM | POA: Diagnosis not present

## 2017-08-31 DIAGNOSIS — D509 Iron deficiency anemia, unspecified: Secondary | ICD-10-CM | POA: Diagnosis not present

## 2017-09-01 DIAGNOSIS — K31819 Angiodysplasia of stomach and duodenum without bleeding: Secondary | ICD-10-CM | POA: Diagnosis not present

## 2017-09-01 DIAGNOSIS — R531 Weakness: Secondary | ICD-10-CM | POA: Diagnosis not present

## 2017-09-01 DIAGNOSIS — D62 Acute posthemorrhagic anemia: Secondary | ICD-10-CM | POA: Diagnosis not present

## 2017-09-01 DIAGNOSIS — R2681 Unsteadiness on feet: Secondary | ICD-10-CM | POA: Diagnosis not present

## 2017-09-01 DIAGNOSIS — K921 Melena: Secondary | ICD-10-CM | POA: Diagnosis not present

## 2017-09-01 DIAGNOSIS — G250 Essential tremor: Secondary | ICD-10-CM | POA: Diagnosis not present

## 2017-09-01 DIAGNOSIS — D509 Iron deficiency anemia, unspecified: Secondary | ICD-10-CM | POA: Diagnosis not present

## 2017-09-01 DIAGNOSIS — K922 Gastrointestinal hemorrhage, unspecified: Secondary | ICD-10-CM | POA: Diagnosis not present

## 2017-09-02 DIAGNOSIS — K922 Gastrointestinal hemorrhage, unspecified: Secondary | ICD-10-CM | POA: Diagnosis not present

## 2017-09-04 DIAGNOSIS — Z87440 Personal history of urinary (tract) infections: Secondary | ICD-10-CM | POA: Diagnosis not present

## 2017-09-04 DIAGNOSIS — J441 Chronic obstructive pulmonary disease with (acute) exacerbation: Secondary | ICD-10-CM | POA: Diagnosis not present

## 2017-09-04 DIAGNOSIS — C7951 Secondary malignant neoplasm of bone: Secondary | ICD-10-CM | POA: Diagnosis not present

## 2017-09-04 DIAGNOSIS — E43 Unspecified severe protein-calorie malnutrition: Secondary | ICD-10-CM | POA: Diagnosis not present

## 2017-09-04 DIAGNOSIS — Z86718 Personal history of other venous thrombosis and embolism: Secondary | ICD-10-CM | POA: Diagnosis not present

## 2017-09-04 DIAGNOSIS — C50919 Malignant neoplasm of unspecified site of unspecified female breast: Secondary | ICD-10-CM | POA: Diagnosis not present

## 2017-09-04 DIAGNOSIS — I890 Lymphedema, not elsewhere classified: Secondary | ICD-10-CM | POA: Diagnosis not present

## 2017-09-04 DIAGNOSIS — I2699 Other pulmonary embolism without acute cor pulmonale: Secondary | ICD-10-CM | POA: Diagnosis not present

## 2017-09-04 DIAGNOSIS — I959 Hypotension, unspecified: Secondary | ICD-10-CM | POA: Diagnosis not present

## 2017-09-04 DIAGNOSIS — G2581 Restless legs syndrome: Secondary | ICD-10-CM | POA: Diagnosis not present

## 2017-09-05 DIAGNOSIS — Z86718 Personal history of other venous thrombosis and embolism: Secondary | ICD-10-CM | POA: Diagnosis not present

## 2017-09-05 DIAGNOSIS — E43 Unspecified severe protein-calorie malnutrition: Secondary | ICD-10-CM | POA: Diagnosis not present

## 2017-09-05 DIAGNOSIS — C50919 Malignant neoplasm of unspecified site of unspecified female breast: Secondary | ICD-10-CM | POA: Diagnosis not present

## 2017-09-05 DIAGNOSIS — I959 Hypotension, unspecified: Secondary | ICD-10-CM | POA: Diagnosis not present

## 2017-09-05 DIAGNOSIS — Z87440 Personal history of urinary (tract) infections: Secondary | ICD-10-CM | POA: Diagnosis not present

## 2017-09-05 DIAGNOSIS — J441 Chronic obstructive pulmonary disease with (acute) exacerbation: Secondary | ICD-10-CM | POA: Diagnosis not present

## 2017-09-05 DIAGNOSIS — I890 Lymphedema, not elsewhere classified: Secondary | ICD-10-CM | POA: Diagnosis not present

## 2017-09-05 DIAGNOSIS — I2699 Other pulmonary embolism without acute cor pulmonale: Secondary | ICD-10-CM | POA: Diagnosis not present

## 2017-09-05 DIAGNOSIS — C7951 Secondary malignant neoplasm of bone: Secondary | ICD-10-CM | POA: Diagnosis not present

## 2017-09-05 DIAGNOSIS — G2581 Restless legs syndrome: Secondary | ICD-10-CM | POA: Diagnosis not present

## 2017-09-06 DIAGNOSIS — C50919 Malignant neoplasm of unspecified site of unspecified female breast: Secondary | ICD-10-CM | POA: Diagnosis not present

## 2017-09-06 DIAGNOSIS — Z87440 Personal history of urinary (tract) infections: Secondary | ICD-10-CM | POA: Diagnosis not present

## 2017-09-06 DIAGNOSIS — I2699 Other pulmonary embolism without acute cor pulmonale: Secondary | ICD-10-CM | POA: Diagnosis not present

## 2017-09-06 DIAGNOSIS — C7951 Secondary malignant neoplasm of bone: Secondary | ICD-10-CM | POA: Diagnosis not present

## 2017-09-06 DIAGNOSIS — Z86718 Personal history of other venous thrombosis and embolism: Secondary | ICD-10-CM | POA: Diagnosis not present

## 2017-09-06 DIAGNOSIS — D500 Iron deficiency anemia secondary to blood loss (chronic): Secondary | ICD-10-CM | POA: Diagnosis not present

## 2017-09-06 DIAGNOSIS — J441 Chronic obstructive pulmonary disease with (acute) exacerbation: Secondary | ICD-10-CM | POA: Diagnosis not present

## 2017-09-06 DIAGNOSIS — I959 Hypotension, unspecified: Secondary | ICD-10-CM | POA: Diagnosis not present

## 2017-09-06 DIAGNOSIS — E43 Unspecified severe protein-calorie malnutrition: Secondary | ICD-10-CM | POA: Diagnosis not present

## 2017-09-06 DIAGNOSIS — G2581 Restless legs syndrome: Secondary | ICD-10-CM | POA: Diagnosis not present

## 2017-09-06 DIAGNOSIS — I890 Lymphedema, not elsewhere classified: Secondary | ICD-10-CM | POA: Diagnosis not present

## 2017-09-07 DIAGNOSIS — J441 Chronic obstructive pulmonary disease with (acute) exacerbation: Secondary | ICD-10-CM | POA: Diagnosis not present

## 2017-09-07 DIAGNOSIS — C50919 Malignant neoplasm of unspecified site of unspecified female breast: Secondary | ICD-10-CM | POA: Diagnosis not present

## 2017-09-07 DIAGNOSIS — Z86718 Personal history of other venous thrombosis and embolism: Secondary | ICD-10-CM | POA: Diagnosis not present

## 2017-09-07 DIAGNOSIS — C7951 Secondary malignant neoplasm of bone: Secondary | ICD-10-CM | POA: Diagnosis not present

## 2017-09-07 DIAGNOSIS — Z87440 Personal history of urinary (tract) infections: Secondary | ICD-10-CM | POA: Diagnosis not present

## 2017-09-07 DIAGNOSIS — G2581 Restless legs syndrome: Secondary | ICD-10-CM | POA: Diagnosis not present

## 2017-09-07 DIAGNOSIS — I2699 Other pulmonary embolism without acute cor pulmonale: Secondary | ICD-10-CM | POA: Diagnosis not present

## 2017-09-07 DIAGNOSIS — I890 Lymphedema, not elsewhere classified: Secondary | ICD-10-CM | POA: Diagnosis not present

## 2017-09-07 DIAGNOSIS — E43 Unspecified severe protein-calorie malnutrition: Secondary | ICD-10-CM | POA: Diagnosis not present

## 2017-09-07 DIAGNOSIS — I959 Hypotension, unspecified: Secondary | ICD-10-CM | POA: Diagnosis not present

## 2017-09-08 DIAGNOSIS — I2699 Other pulmonary embolism without acute cor pulmonale: Secondary | ICD-10-CM | POA: Diagnosis not present

## 2017-09-08 DIAGNOSIS — G2581 Restless legs syndrome: Secondary | ICD-10-CM | POA: Diagnosis not present

## 2017-09-08 DIAGNOSIS — Z87440 Personal history of urinary (tract) infections: Secondary | ICD-10-CM | POA: Diagnosis not present

## 2017-09-08 DIAGNOSIS — J441 Chronic obstructive pulmonary disease with (acute) exacerbation: Secondary | ICD-10-CM | POA: Diagnosis not present

## 2017-09-08 DIAGNOSIS — C50919 Malignant neoplasm of unspecified site of unspecified female breast: Secondary | ICD-10-CM | POA: Diagnosis not present

## 2017-09-08 DIAGNOSIS — C7951 Secondary malignant neoplasm of bone: Secondary | ICD-10-CM | POA: Diagnosis not present

## 2017-09-08 DIAGNOSIS — Z86718 Personal history of other venous thrombosis and embolism: Secondary | ICD-10-CM | POA: Diagnosis not present

## 2017-09-08 DIAGNOSIS — I959 Hypotension, unspecified: Secondary | ICD-10-CM | POA: Diagnosis not present

## 2017-09-08 DIAGNOSIS — E43 Unspecified severe protein-calorie malnutrition: Secondary | ICD-10-CM | POA: Diagnosis not present

## 2017-09-08 DIAGNOSIS — I890 Lymphedema, not elsewhere classified: Secondary | ICD-10-CM | POA: Diagnosis not present

## 2017-09-11 DIAGNOSIS — C50919 Malignant neoplasm of unspecified site of unspecified female breast: Secondary | ICD-10-CM | POA: Diagnosis not present

## 2017-09-11 DIAGNOSIS — I890 Lymphedema, not elsewhere classified: Secondary | ICD-10-CM | POA: Diagnosis not present

## 2017-09-11 DIAGNOSIS — I959 Hypotension, unspecified: Secondary | ICD-10-CM | POA: Diagnosis not present

## 2017-09-11 DIAGNOSIS — C7951 Secondary malignant neoplasm of bone: Secondary | ICD-10-CM | POA: Diagnosis not present

## 2017-09-11 DIAGNOSIS — J441 Chronic obstructive pulmonary disease with (acute) exacerbation: Secondary | ICD-10-CM | POA: Diagnosis not present

## 2017-09-11 DIAGNOSIS — D500 Iron deficiency anemia secondary to blood loss (chronic): Secondary | ICD-10-CM | POA: Diagnosis not present

## 2017-09-11 DIAGNOSIS — I2699 Other pulmonary embolism without acute cor pulmonale: Secondary | ICD-10-CM | POA: Diagnosis not present

## 2017-09-11 DIAGNOSIS — E43 Unspecified severe protein-calorie malnutrition: Secondary | ICD-10-CM | POA: Diagnosis not present

## 2017-09-11 DIAGNOSIS — Z87440 Personal history of urinary (tract) infections: Secondary | ICD-10-CM | POA: Diagnosis not present

## 2017-09-11 DIAGNOSIS — Z86718 Personal history of other venous thrombosis and embolism: Secondary | ICD-10-CM | POA: Diagnosis not present

## 2017-09-11 DIAGNOSIS — G2581 Restless legs syndrome: Secondary | ICD-10-CM | POA: Diagnosis not present

## 2017-09-13 DIAGNOSIS — I872 Venous insufficiency (chronic) (peripheral): Secondary | ICD-10-CM | POA: Diagnosis not present

## 2017-09-13 DIAGNOSIS — L97211 Non-pressure chronic ulcer of right calf limited to breakdown of skin: Secondary | ICD-10-CM | POA: Diagnosis not present

## 2017-09-13 DIAGNOSIS — X58XXXD Exposure to other specified factors, subsequent encounter: Secondary | ICD-10-CM | POA: Diagnosis not present

## 2017-09-14 DIAGNOSIS — I959 Hypotension, unspecified: Secondary | ICD-10-CM | POA: Diagnosis not present

## 2017-09-14 DIAGNOSIS — I2699 Other pulmonary embolism without acute cor pulmonale: Secondary | ICD-10-CM | POA: Diagnosis not present

## 2017-09-14 DIAGNOSIS — E43 Unspecified severe protein-calorie malnutrition: Secondary | ICD-10-CM | POA: Diagnosis not present

## 2017-09-14 DIAGNOSIS — J441 Chronic obstructive pulmonary disease with (acute) exacerbation: Secondary | ICD-10-CM | POA: Diagnosis not present

## 2017-09-14 DIAGNOSIS — Z86718 Personal history of other venous thrombosis and embolism: Secondary | ICD-10-CM | POA: Diagnosis not present

## 2017-09-14 DIAGNOSIS — L97919 Non-pressure chronic ulcer of unspecified part of right lower leg with unspecified severity: Secondary | ICD-10-CM | POA: Diagnosis not present

## 2017-09-14 DIAGNOSIS — Z87440 Personal history of urinary (tract) infections: Secondary | ICD-10-CM | POA: Diagnosis not present

## 2017-09-14 DIAGNOSIS — C50919 Malignant neoplasm of unspecified site of unspecified female breast: Secondary | ICD-10-CM | POA: Diagnosis not present

## 2017-09-14 DIAGNOSIS — G2581 Restless legs syndrome: Secondary | ICD-10-CM | POA: Diagnosis not present

## 2017-09-14 DIAGNOSIS — C7951 Secondary malignant neoplasm of bone: Secondary | ICD-10-CM | POA: Diagnosis not present

## 2017-09-14 DIAGNOSIS — I890 Lymphedema, not elsewhere classified: Secondary | ICD-10-CM | POA: Diagnosis not present

## 2017-09-15 DIAGNOSIS — J441 Chronic obstructive pulmonary disease with (acute) exacerbation: Secondary | ICD-10-CM | POA: Diagnosis not present

## 2017-09-15 DIAGNOSIS — Z87440 Personal history of urinary (tract) infections: Secondary | ICD-10-CM | POA: Diagnosis not present

## 2017-09-15 DIAGNOSIS — I959 Hypotension, unspecified: Secondary | ICD-10-CM | POA: Diagnosis not present

## 2017-09-15 DIAGNOSIS — I2699 Other pulmonary embolism without acute cor pulmonale: Secondary | ICD-10-CM | POA: Diagnosis not present

## 2017-09-15 DIAGNOSIS — I890 Lymphedema, not elsewhere classified: Secondary | ICD-10-CM | POA: Diagnosis not present

## 2017-09-15 DIAGNOSIS — Z86718 Personal history of other venous thrombosis and embolism: Secondary | ICD-10-CM | POA: Diagnosis not present

## 2017-09-15 DIAGNOSIS — E43 Unspecified severe protein-calorie malnutrition: Secondary | ICD-10-CM | POA: Diagnosis not present

## 2017-09-15 DIAGNOSIS — C50919 Malignant neoplasm of unspecified site of unspecified female breast: Secondary | ICD-10-CM | POA: Diagnosis not present

## 2017-09-15 DIAGNOSIS — C7951 Secondary malignant neoplasm of bone: Secondary | ICD-10-CM | POA: Diagnosis not present

## 2017-09-15 DIAGNOSIS — G2581 Restless legs syndrome: Secondary | ICD-10-CM | POA: Diagnosis not present

## 2017-09-18 DIAGNOSIS — Z86718 Personal history of other venous thrombosis and embolism: Secondary | ICD-10-CM | POA: Diagnosis not present

## 2017-09-18 DIAGNOSIS — I2699 Other pulmonary embolism without acute cor pulmonale: Secondary | ICD-10-CM | POA: Diagnosis not present

## 2017-09-18 DIAGNOSIS — I890 Lymphedema, not elsewhere classified: Secondary | ICD-10-CM | POA: Diagnosis not present

## 2017-09-18 DIAGNOSIS — I959 Hypotension, unspecified: Secondary | ICD-10-CM | POA: Diagnosis not present

## 2017-09-18 DIAGNOSIS — J441 Chronic obstructive pulmonary disease with (acute) exacerbation: Secondary | ICD-10-CM | POA: Diagnosis not present

## 2017-09-18 DIAGNOSIS — E43 Unspecified severe protein-calorie malnutrition: Secondary | ICD-10-CM | POA: Diagnosis not present

## 2017-09-18 DIAGNOSIS — C50919 Malignant neoplasm of unspecified site of unspecified female breast: Secondary | ICD-10-CM | POA: Diagnosis not present

## 2017-09-18 DIAGNOSIS — C7951 Secondary malignant neoplasm of bone: Secondary | ICD-10-CM | POA: Diagnosis not present

## 2017-09-18 DIAGNOSIS — G2581 Restless legs syndrome: Secondary | ICD-10-CM | POA: Diagnosis not present

## 2017-09-18 DIAGNOSIS — D500 Iron deficiency anemia secondary to blood loss (chronic): Secondary | ICD-10-CM | POA: Diagnosis not present

## 2017-09-18 DIAGNOSIS — Z87440 Personal history of urinary (tract) infections: Secondary | ICD-10-CM | POA: Diagnosis not present

## 2017-09-20 DIAGNOSIS — C7951 Secondary malignant neoplasm of bone: Secondary | ICD-10-CM | POA: Diagnosis not present

## 2017-09-20 DIAGNOSIS — I890 Lymphedema, not elsewhere classified: Secondary | ICD-10-CM | POA: Diagnosis not present

## 2017-09-20 DIAGNOSIS — J441 Chronic obstructive pulmonary disease with (acute) exacerbation: Secondary | ICD-10-CM | POA: Diagnosis not present

## 2017-09-20 DIAGNOSIS — I2699 Other pulmonary embolism without acute cor pulmonale: Secondary | ICD-10-CM | POA: Diagnosis not present

## 2017-09-20 DIAGNOSIS — Z87440 Personal history of urinary (tract) infections: Secondary | ICD-10-CM | POA: Diagnosis not present

## 2017-09-20 DIAGNOSIS — G2581 Restless legs syndrome: Secondary | ICD-10-CM | POA: Diagnosis not present

## 2017-09-20 DIAGNOSIS — I959 Hypotension, unspecified: Secondary | ICD-10-CM | POA: Diagnosis not present

## 2017-09-20 DIAGNOSIS — E43 Unspecified severe protein-calorie malnutrition: Secondary | ICD-10-CM | POA: Diagnosis not present

## 2017-09-20 DIAGNOSIS — Z86718 Personal history of other venous thrombosis and embolism: Secondary | ICD-10-CM | POA: Diagnosis not present

## 2017-09-20 DIAGNOSIS — C50919 Malignant neoplasm of unspecified site of unspecified female breast: Secondary | ICD-10-CM | POA: Diagnosis not present

## 2017-09-22 DIAGNOSIS — Z87440 Personal history of urinary (tract) infections: Secondary | ICD-10-CM | POA: Diagnosis not present

## 2017-09-22 DIAGNOSIS — Z86718 Personal history of other venous thrombosis and embolism: Secondary | ICD-10-CM | POA: Diagnosis not present

## 2017-09-22 DIAGNOSIS — J441 Chronic obstructive pulmonary disease with (acute) exacerbation: Secondary | ICD-10-CM | POA: Diagnosis not present

## 2017-09-22 DIAGNOSIS — G2581 Restless legs syndrome: Secondary | ICD-10-CM | POA: Diagnosis not present

## 2017-09-22 DIAGNOSIS — I959 Hypotension, unspecified: Secondary | ICD-10-CM | POA: Diagnosis not present

## 2017-09-22 DIAGNOSIS — I890 Lymphedema, not elsewhere classified: Secondary | ICD-10-CM | POA: Diagnosis not present

## 2017-09-22 DIAGNOSIS — C7951 Secondary malignant neoplasm of bone: Secondary | ICD-10-CM | POA: Diagnosis not present

## 2017-09-22 DIAGNOSIS — C50919 Malignant neoplasm of unspecified site of unspecified female breast: Secondary | ICD-10-CM | POA: Diagnosis not present

## 2017-09-22 DIAGNOSIS — E43 Unspecified severe protein-calorie malnutrition: Secondary | ICD-10-CM | POA: Diagnosis not present

## 2017-09-22 DIAGNOSIS — I2699 Other pulmonary embolism without acute cor pulmonale: Secondary | ICD-10-CM | POA: Diagnosis not present

## 2017-09-25 DIAGNOSIS — J441 Chronic obstructive pulmonary disease with (acute) exacerbation: Secondary | ICD-10-CM | POA: Diagnosis not present

## 2017-09-25 DIAGNOSIS — C7951 Secondary malignant neoplasm of bone: Secondary | ICD-10-CM | POA: Diagnosis not present

## 2017-09-25 DIAGNOSIS — D500 Iron deficiency anemia secondary to blood loss (chronic): Secondary | ICD-10-CM | POA: Diagnosis not present

## 2017-09-25 DIAGNOSIS — G2581 Restless legs syndrome: Secondary | ICD-10-CM | POA: Diagnosis not present

## 2017-09-25 DIAGNOSIS — I959 Hypotension, unspecified: Secondary | ICD-10-CM | POA: Diagnosis not present

## 2017-09-25 DIAGNOSIS — E43 Unspecified severe protein-calorie malnutrition: Secondary | ICD-10-CM | POA: Diagnosis not present

## 2017-09-25 DIAGNOSIS — Z86718 Personal history of other venous thrombosis and embolism: Secondary | ICD-10-CM | POA: Diagnosis not present

## 2017-09-25 DIAGNOSIS — Z87440 Personal history of urinary (tract) infections: Secondary | ICD-10-CM | POA: Diagnosis not present

## 2017-09-25 DIAGNOSIS — I890 Lymphedema, not elsewhere classified: Secondary | ICD-10-CM | POA: Diagnosis not present

## 2017-09-25 DIAGNOSIS — I2699 Other pulmonary embolism without acute cor pulmonale: Secondary | ICD-10-CM | POA: Diagnosis not present

## 2017-09-25 DIAGNOSIS — C50919 Malignant neoplasm of unspecified site of unspecified female breast: Secondary | ICD-10-CM | POA: Diagnosis not present

## 2017-09-27 DIAGNOSIS — E43 Unspecified severe protein-calorie malnutrition: Secondary | ICD-10-CM | POA: Diagnosis not present

## 2017-09-27 DIAGNOSIS — C7951 Secondary malignant neoplasm of bone: Secondary | ICD-10-CM | POA: Diagnosis not present

## 2017-09-27 DIAGNOSIS — I2699 Other pulmonary embolism without acute cor pulmonale: Secondary | ICD-10-CM | POA: Diagnosis not present

## 2017-09-27 DIAGNOSIS — G2581 Restless legs syndrome: Secondary | ICD-10-CM | POA: Diagnosis not present

## 2017-09-27 DIAGNOSIS — Z86718 Personal history of other venous thrombosis and embolism: Secondary | ICD-10-CM | POA: Diagnosis not present

## 2017-09-27 DIAGNOSIS — I890 Lymphedema, not elsewhere classified: Secondary | ICD-10-CM | POA: Diagnosis not present

## 2017-09-27 DIAGNOSIS — C50919 Malignant neoplasm of unspecified site of unspecified female breast: Secondary | ICD-10-CM | POA: Diagnosis not present

## 2017-09-27 DIAGNOSIS — J441 Chronic obstructive pulmonary disease with (acute) exacerbation: Secondary | ICD-10-CM | POA: Diagnosis not present

## 2017-09-27 DIAGNOSIS — I959 Hypotension, unspecified: Secondary | ICD-10-CM | POA: Diagnosis not present

## 2017-09-27 DIAGNOSIS — Z87440 Personal history of urinary (tract) infections: Secondary | ICD-10-CM | POA: Diagnosis not present

## 2017-09-29 DIAGNOSIS — Z86718 Personal history of other venous thrombosis and embolism: Secondary | ICD-10-CM | POA: Diagnosis not present

## 2017-09-29 DIAGNOSIS — Z170 Estrogen receptor positive status [ER+]: Secondary | ICD-10-CM | POA: Diagnosis not present

## 2017-09-29 DIAGNOSIS — R251 Tremor, unspecified: Secondary | ICD-10-CM | POA: Diagnosis not present

## 2017-09-29 DIAGNOSIS — R531 Weakness: Secondary | ICD-10-CM | POA: Diagnosis not present

## 2017-09-29 DIAGNOSIS — J449 Chronic obstructive pulmonary disease, unspecified: Secondary | ICD-10-CM | POA: Diagnosis not present

## 2017-09-29 DIAGNOSIS — Z853 Personal history of malignant neoplasm of breast: Secondary | ICD-10-CM | POA: Diagnosis not present

## 2017-09-29 DIAGNOSIS — C50912 Malignant neoplasm of unspecified site of left female breast: Secondary | ICD-10-CM | POA: Diagnosis not present

## 2017-09-29 DIAGNOSIS — Z79899 Other long term (current) drug therapy: Secondary | ICD-10-CM | POA: Diagnosis not present

## 2017-09-29 DIAGNOSIS — Z86711 Personal history of pulmonary embolism: Secondary | ICD-10-CM | POA: Diagnosis not present

## 2017-09-29 DIAGNOSIS — R69 Illness, unspecified: Secondary | ICD-10-CM | POA: Diagnosis not present

## 2017-09-29 DIAGNOSIS — R000 Tachycardia, unspecified: Secondary | ICD-10-CM | POA: Diagnosis not present

## 2017-09-29 DIAGNOSIS — G250 Essential tremor: Secondary | ICD-10-CM | POA: Diagnosis not present

## 2017-09-29 DIAGNOSIS — C7951 Secondary malignant neoplasm of bone: Secondary | ICD-10-CM | POA: Diagnosis not present

## 2017-09-29 DIAGNOSIS — R2681 Unsteadiness on feet: Secondary | ICD-10-CM | POA: Diagnosis not present

## 2017-09-29 DIAGNOSIS — R259 Unspecified abnormal involuntary movements: Secondary | ICD-10-CM | POA: Diagnosis not present

## 2017-10-02 DIAGNOSIS — I2699 Other pulmonary embolism without acute cor pulmonale: Secondary | ICD-10-CM | POA: Diagnosis not present

## 2017-10-02 DIAGNOSIS — E43 Unspecified severe protein-calorie malnutrition: Secondary | ICD-10-CM | POA: Diagnosis not present

## 2017-10-02 DIAGNOSIS — J441 Chronic obstructive pulmonary disease with (acute) exacerbation: Secondary | ICD-10-CM | POA: Diagnosis not present

## 2017-10-02 DIAGNOSIS — C7951 Secondary malignant neoplasm of bone: Secondary | ICD-10-CM | POA: Diagnosis not present

## 2017-10-02 DIAGNOSIS — C50919 Malignant neoplasm of unspecified site of unspecified female breast: Secondary | ICD-10-CM | POA: Diagnosis not present

## 2017-10-02 DIAGNOSIS — Z86718 Personal history of other venous thrombosis and embolism: Secondary | ICD-10-CM | POA: Diagnosis not present

## 2017-10-02 DIAGNOSIS — I890 Lymphedema, not elsewhere classified: Secondary | ICD-10-CM | POA: Diagnosis not present

## 2017-10-02 DIAGNOSIS — E46 Unspecified protein-calorie malnutrition: Secondary | ICD-10-CM | POA: Diagnosis not present

## 2017-10-02 DIAGNOSIS — Z515 Encounter for palliative care: Secondary | ICD-10-CM | POA: Diagnosis not present

## 2017-10-02 DIAGNOSIS — J449 Chronic obstructive pulmonary disease, unspecified: Secondary | ICD-10-CM | POA: Diagnosis not present

## 2017-10-02 DIAGNOSIS — Z87440 Personal history of urinary (tract) infections: Secondary | ICD-10-CM | POA: Diagnosis not present

## 2017-10-02 DIAGNOSIS — I959 Hypotension, unspecified: Secondary | ICD-10-CM | POA: Diagnosis not present

## 2017-10-02 DIAGNOSIS — R69 Illness, unspecified: Secondary | ICD-10-CM | POA: Diagnosis not present

## 2017-10-02 DIAGNOSIS — G2581 Restless legs syndrome: Secondary | ICD-10-CM | POA: Diagnosis not present

## 2017-10-04 DIAGNOSIS — J441 Chronic obstructive pulmonary disease with (acute) exacerbation: Secondary | ICD-10-CM | POA: Diagnosis not present

## 2017-10-04 DIAGNOSIS — Z86718 Personal history of other venous thrombosis and embolism: Secondary | ICD-10-CM | POA: Diagnosis not present

## 2017-10-04 DIAGNOSIS — C50919 Malignant neoplasm of unspecified site of unspecified female breast: Secondary | ICD-10-CM | POA: Diagnosis not present

## 2017-10-04 DIAGNOSIS — Z87440 Personal history of urinary (tract) infections: Secondary | ICD-10-CM | POA: Diagnosis not present

## 2017-10-04 DIAGNOSIS — E43 Unspecified severe protein-calorie malnutrition: Secondary | ICD-10-CM | POA: Diagnosis not present

## 2017-10-04 DIAGNOSIS — I2699 Other pulmonary embolism without acute cor pulmonale: Secondary | ICD-10-CM | POA: Diagnosis not present

## 2017-10-04 DIAGNOSIS — C7951 Secondary malignant neoplasm of bone: Secondary | ICD-10-CM | POA: Diagnosis not present

## 2017-10-04 DIAGNOSIS — I959 Hypotension, unspecified: Secondary | ICD-10-CM | POA: Diagnosis not present

## 2017-10-04 DIAGNOSIS — I890 Lymphedema, not elsewhere classified: Secondary | ICD-10-CM | POA: Diagnosis not present

## 2017-10-04 DIAGNOSIS — G2581 Restless legs syndrome: Secondary | ICD-10-CM | POA: Diagnosis not present

## 2017-10-06 DIAGNOSIS — E43 Unspecified severe protein-calorie malnutrition: Secondary | ICD-10-CM | POA: Diagnosis not present

## 2017-10-06 DIAGNOSIS — Z87440 Personal history of urinary (tract) infections: Secondary | ICD-10-CM | POA: Diagnosis not present

## 2017-10-06 DIAGNOSIS — G2581 Restless legs syndrome: Secondary | ICD-10-CM | POA: Diagnosis not present

## 2017-10-06 DIAGNOSIS — Z86718 Personal history of other venous thrombosis and embolism: Secondary | ICD-10-CM | POA: Diagnosis not present

## 2017-10-06 DIAGNOSIS — I890 Lymphedema, not elsewhere classified: Secondary | ICD-10-CM | POA: Diagnosis not present

## 2017-10-06 DIAGNOSIS — I959 Hypotension, unspecified: Secondary | ICD-10-CM | POA: Diagnosis not present

## 2017-10-06 DIAGNOSIS — C50919 Malignant neoplasm of unspecified site of unspecified female breast: Secondary | ICD-10-CM | POA: Diagnosis not present

## 2017-10-06 DIAGNOSIS — I2699 Other pulmonary embolism without acute cor pulmonale: Secondary | ICD-10-CM | POA: Diagnosis not present

## 2017-10-06 DIAGNOSIS — C7951 Secondary malignant neoplasm of bone: Secondary | ICD-10-CM | POA: Diagnosis not present

## 2017-10-06 DIAGNOSIS — J441 Chronic obstructive pulmonary disease with (acute) exacerbation: Secondary | ICD-10-CM | POA: Diagnosis not present

## 2017-10-09 DIAGNOSIS — Z86718 Personal history of other venous thrombosis and embolism: Secondary | ICD-10-CM | POA: Diagnosis not present

## 2017-10-09 DIAGNOSIS — Z87440 Personal history of urinary (tract) infections: Secondary | ICD-10-CM | POA: Diagnosis not present

## 2017-10-09 DIAGNOSIS — C50919 Malignant neoplasm of unspecified site of unspecified female breast: Secondary | ICD-10-CM | POA: Diagnosis not present

## 2017-10-09 DIAGNOSIS — I890 Lymphedema, not elsewhere classified: Secondary | ICD-10-CM | POA: Diagnosis not present

## 2017-10-09 DIAGNOSIS — E43 Unspecified severe protein-calorie malnutrition: Secondary | ICD-10-CM | POA: Diagnosis not present

## 2017-10-09 DIAGNOSIS — C7951 Secondary malignant neoplasm of bone: Secondary | ICD-10-CM | POA: Diagnosis not present

## 2017-10-09 DIAGNOSIS — I2699 Other pulmonary embolism without acute cor pulmonale: Secondary | ICD-10-CM | POA: Diagnosis not present

## 2017-10-09 DIAGNOSIS — J441 Chronic obstructive pulmonary disease with (acute) exacerbation: Secondary | ICD-10-CM | POA: Diagnosis not present

## 2017-10-09 DIAGNOSIS — I959 Hypotension, unspecified: Secondary | ICD-10-CM | POA: Diagnosis not present

## 2017-10-09 DIAGNOSIS — G2581 Restless legs syndrome: Secondary | ICD-10-CM | POA: Diagnosis not present

## 2017-10-18 ENCOUNTER — Other Ambulatory Visit: Payer: Self-pay | Admitting: Family

## 2017-10-18 DIAGNOSIS — R600 Localized edema: Secondary | ICD-10-CM

## 2017-10-18 DIAGNOSIS — I82433 Acute embolism and thrombosis of popliteal vein, bilateral: Secondary | ICD-10-CM

## 2017-10-18 DIAGNOSIS — C7951 Secondary malignant neoplasm of bone: Principal | ICD-10-CM

## 2017-10-18 DIAGNOSIS — C50919 Malignant neoplasm of unspecified site of unspecified female breast: Secondary | ICD-10-CM

## 2017-10-19 DIAGNOSIS — R69 Illness, unspecified: Secondary | ICD-10-CM | POA: Diagnosis not present

## 2017-10-30 DIAGNOSIS — G250 Essential tremor: Secondary | ICD-10-CM | POA: Diagnosis not present

## 2017-10-30 DIAGNOSIS — R531 Weakness: Secondary | ICD-10-CM | POA: Diagnosis not present

## 2017-10-30 DIAGNOSIS — R2681 Unsteadiness on feet: Secondary | ICD-10-CM | POA: Diagnosis not present

## 2017-11-07 DIAGNOSIS — I9589 Other hypotension: Secondary | ICD-10-CM | POA: Diagnosis not present

## 2017-11-07 DIAGNOSIS — R17 Unspecified jaundice: Secondary | ICD-10-CM | POA: Diagnosis not present

## 2017-11-07 DIAGNOSIS — E861 Hypovolemia: Secondary | ICD-10-CM | POA: Diagnosis not present

## 2017-11-07 DIAGNOSIS — C50919 Malignant neoplasm of unspecified site of unspecified female breast: Secondary | ICD-10-CM | POA: Diagnosis not present

## 2017-11-07 DIAGNOSIS — R627 Adult failure to thrive: Secondary | ICD-10-CM | POA: Diagnosis not present

## 2017-11-07 DIAGNOSIS — R6889 Other general symptoms and signs: Secondary | ICD-10-CM | POA: Diagnosis not present

## 2017-11-29 DIAGNOSIS — R531 Weakness: Secondary | ICD-10-CM | POA: Diagnosis not present

## 2017-11-29 DIAGNOSIS — G250 Essential tremor: Secondary | ICD-10-CM | POA: Diagnosis not present

## 2017-11-29 DIAGNOSIS — R2681 Unsteadiness on feet: Secondary | ICD-10-CM | POA: Diagnosis not present

## 2017-12-09 DEATH — deceased

## 2018-01-11 IMAGING — CR DG CHEST 1V PORT
1 series · 1 of 1 positions shown · non-contrast
Comparison: CTA 12/24/2016

CLINICAL DATA: Shortness of breath for 2 weeks.  Fever tonight.

EXAM:
PORTABLE CHEST 1 VIEW

[AP]
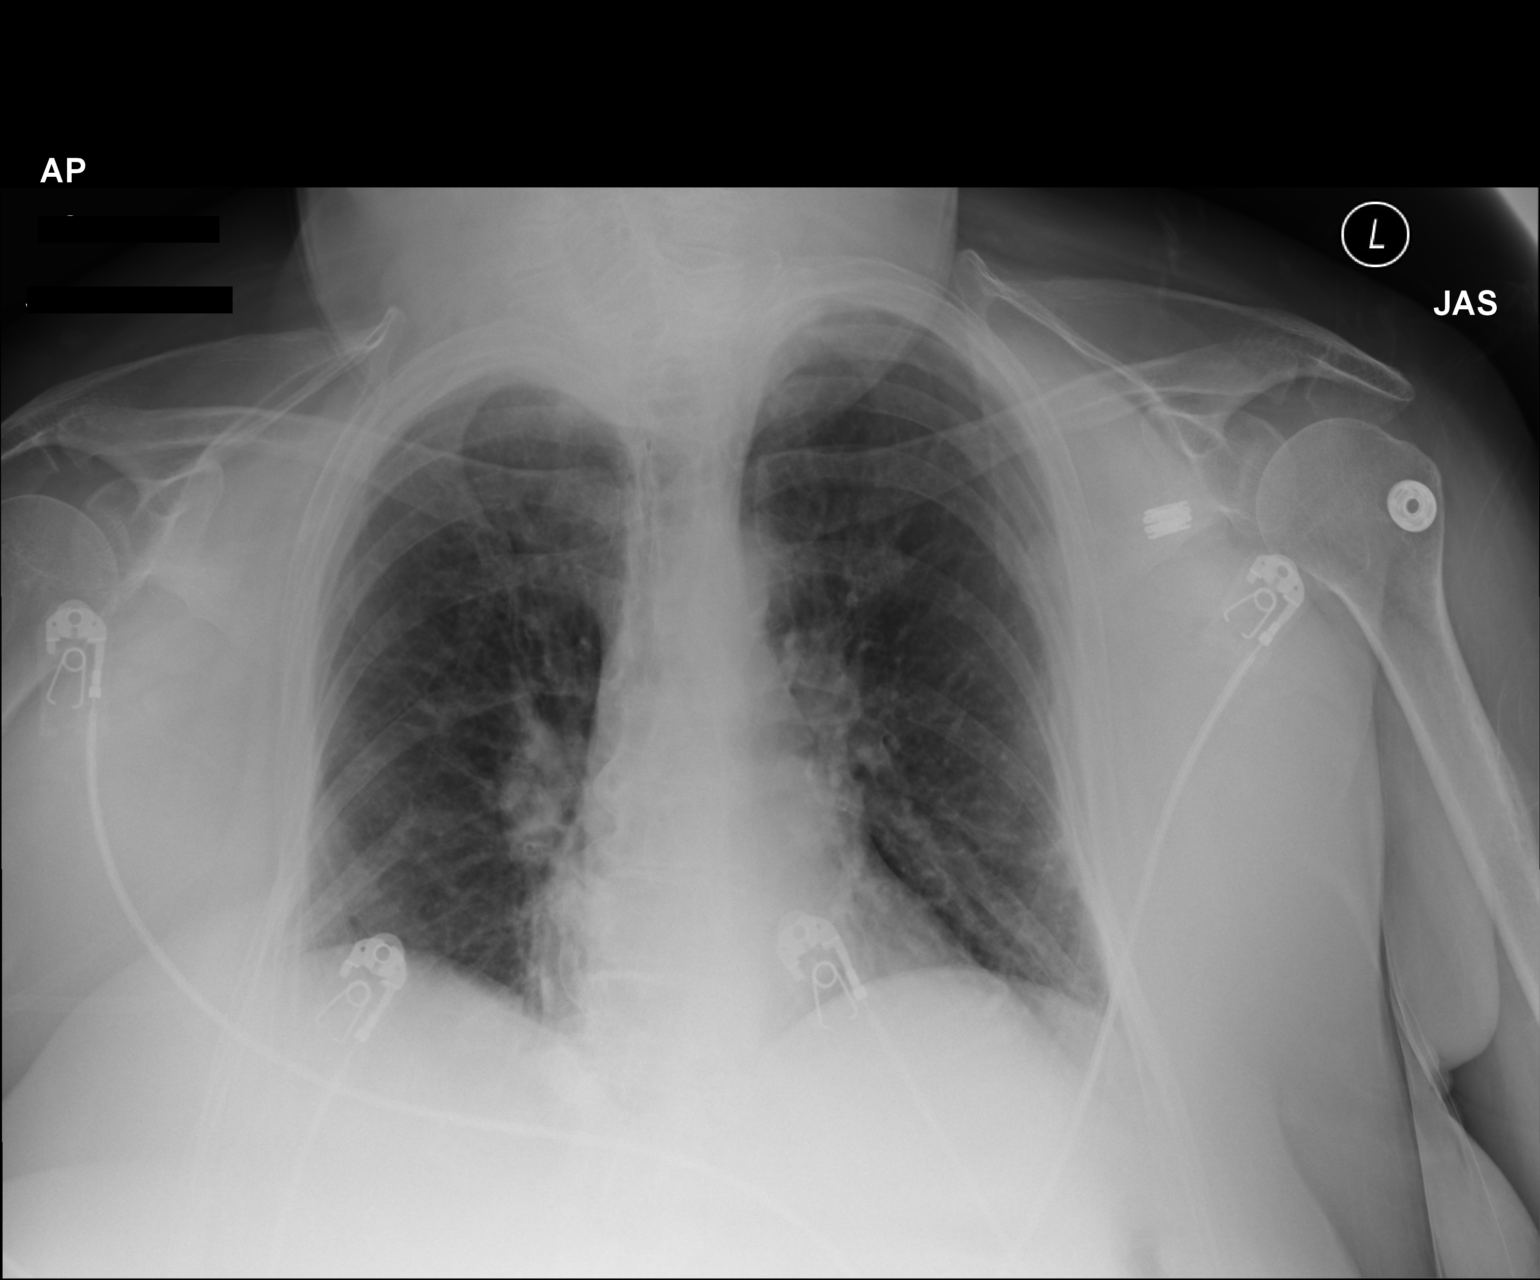

[1 of 1 positions shown; findings below may reference images not displayed]

FINDINGS: Low lung volumes. No consolidation to suggest pneumonia. Heart is
normal in size. No pleural effusion or pneumothorax. Left lateral
rib fracture again seen.
IMPRESSION: Low lung volumes.  No evidence of acute abnormality.

## 2018-01-12 IMAGING — DX DG CHEST 1V PORT
1 series · 1 of 1 positions shown · non-contrast
Comparison: 01/09/2017

CLINICAL DATA: Shortness of breath.

EXAM:
PORTABLE CHEST 1 VIEW

[chest ap]
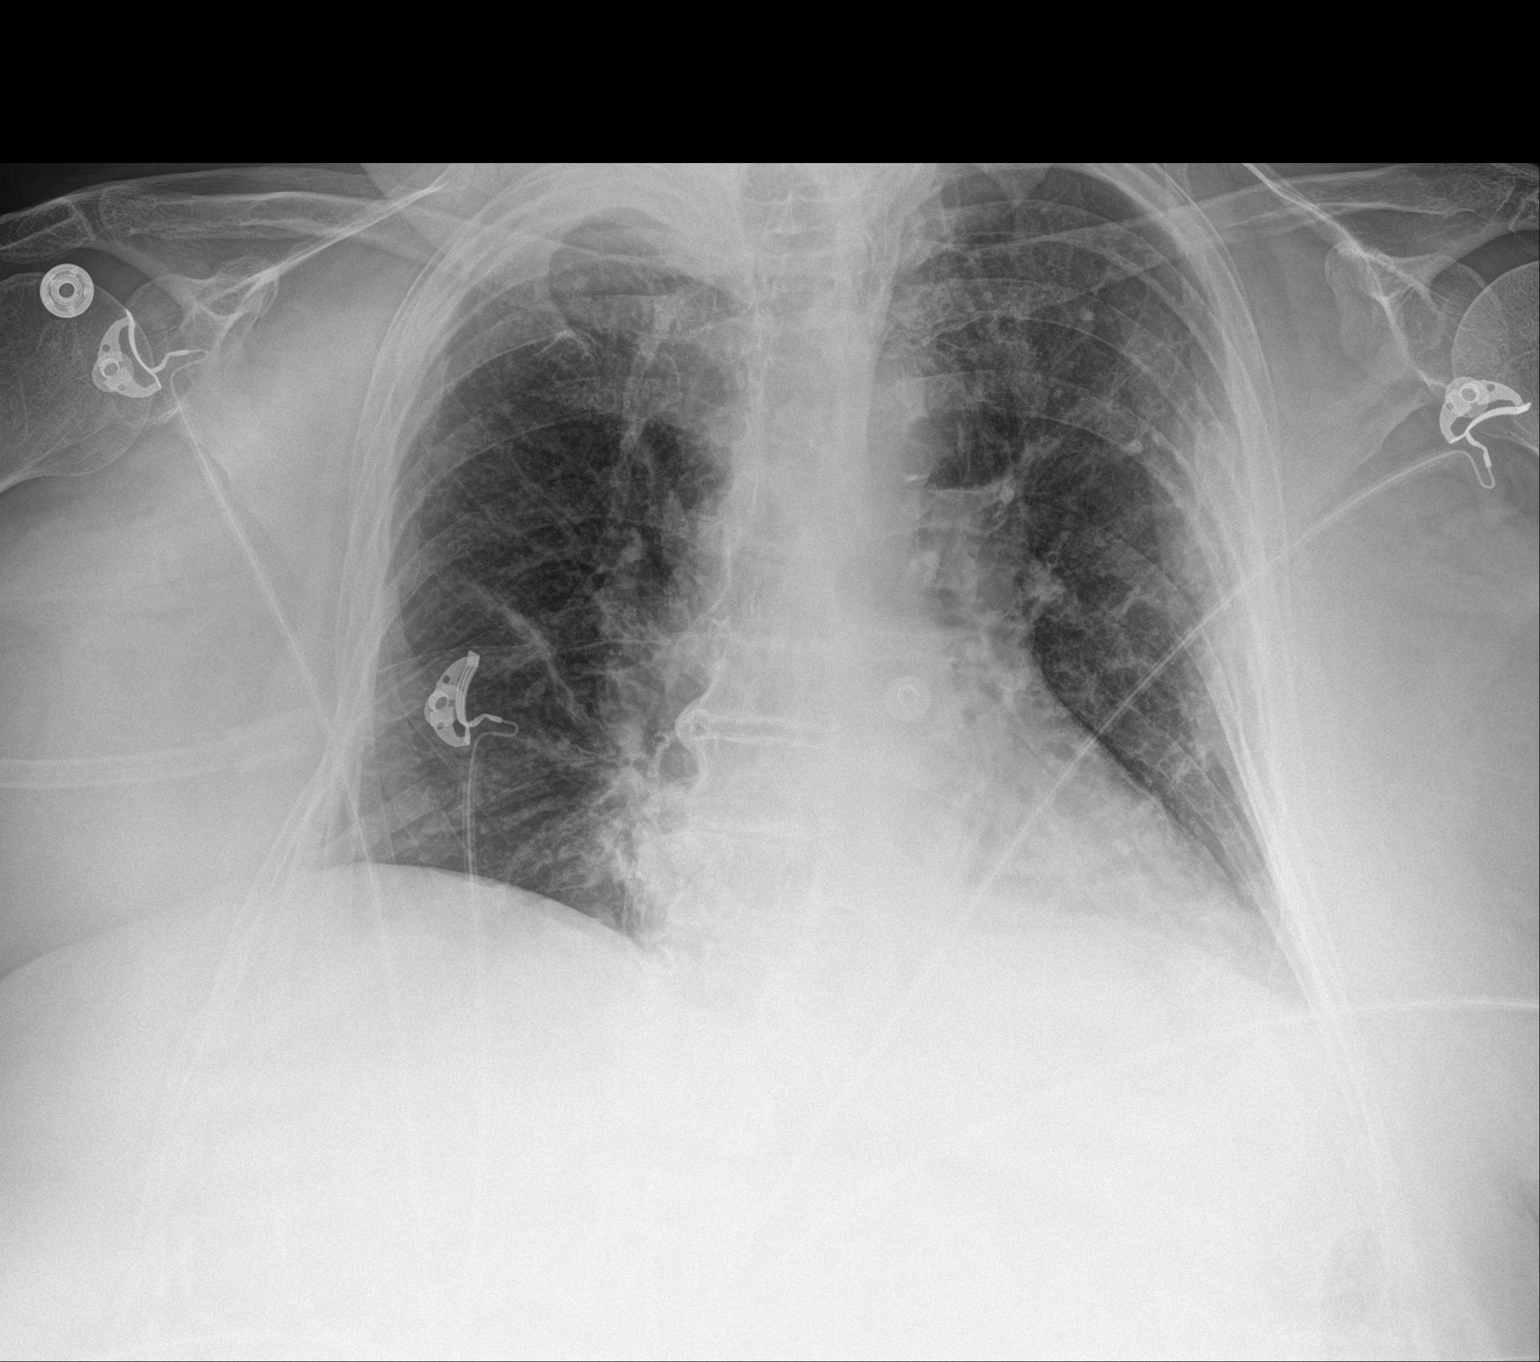

[1 of 1 positions shown; findings below may reference images not displayed]

FINDINGS: The cardiac silhouette is upper limits of normal in size. The lungs
are hypoinflated with new mild patchy left basilar opacity.
Curvilinear opacity in the right perihilar region likely represents
subsegmental atelectasis. No sizable pleural effusion or
pneumothorax is identified. Multiple left rib fractures are noted
with associated callus formation.
IMPRESSION: 1. Low lung volumes with patchy left basilar opacity which may
reflect atelectasis or developing pneumonia.
2. Subsegmental atelectasis in the right midlung.

## 2018-09-13 IMAGING — US US BIOPSY
1 series · 12 of 21 positions shown · non-contrast
Comparison: No prior breast imaging. CT chest 12/24/2016 is
correlated.

ADDENDUM:
PATHOLOGY ADDENDUM:

Pathology: INVASIVE DUCTAL CARCINOMA, GRADE 2 AND HIGH-GRADE DCIS.
Pathology concordant with imaging findings: Yes.
The findings and recommendations were discussed with Dr. Zulma on
12/28/2016 at [DATE] p.m..
RECOMMENDATION:
1. Consider bilateral diagnostic mammography at the [REDACTED] as this has not yet been performed.
2. Ultrasound-guided tissue marker clip placement within the mass in
the upper outer quadrant of the left breast at the time of
diagnostic workup (if the patient is considered a potential surgical
candidate). Please see localization/excision considerations below.
3. Treatment plan.
**LOCALIZATION/EXCISION CONSIDERATIONS: Because the biopsy was
performed in the hospital rather than at the [REDACTED], a biopsy tissue marker clip was not available
for placement after the biopsy. Therefore, ultrasound-guided
placement of a tissue marker clip within the biopsied mass prior to
or just after beginning the neoadjuvant therapy should be done if
the patient is considered for surgical excision. (If the tumor has a
complete imaging response to neoadjuvant chemotherapy, there will be
no way to localize the site prior to excision if a tissue marker
clip is not placed).
If the patient is not deemed a surgical candidate, then clip
placement is not essential. **
CLINICAL DATA: 65-year-old who presented this admission with a
pathological fracture involving the distal right humerus for which
she will underwent ORIF. CT examination 12/24/2016 demonstrated a
5.3 cm mass involving the upper outer quadrant of the left breast.
Biopsy is requested.
EXAM:
ULTRASOUND GUIDED LEFT BREAST CORE NEEDLE BIOPSY

[Series 1: us biopsy · 0.06mm/px · 12 of 21 slices shown]
[im 1/21]
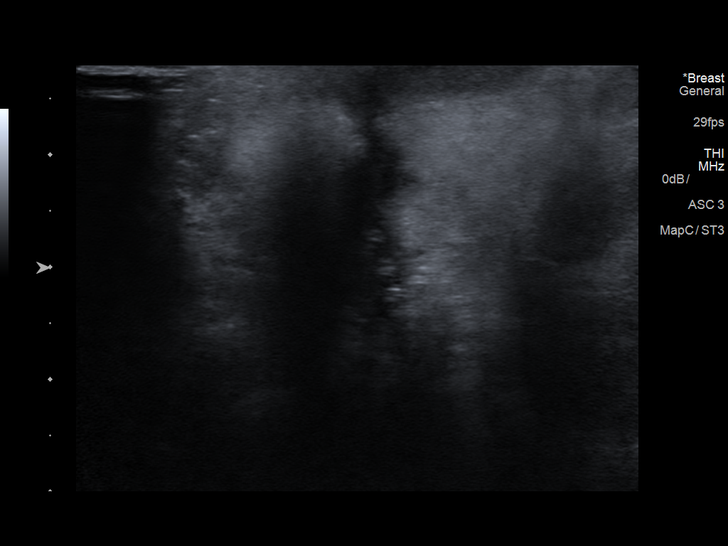
[im 3/21]
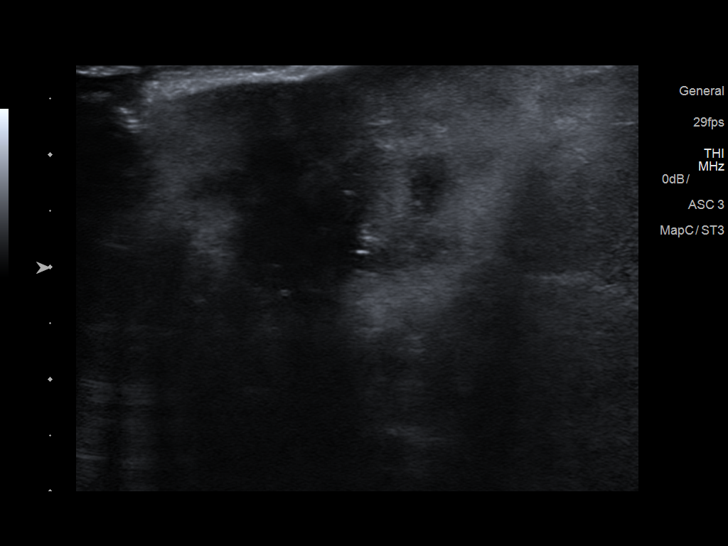
[im 5/21]
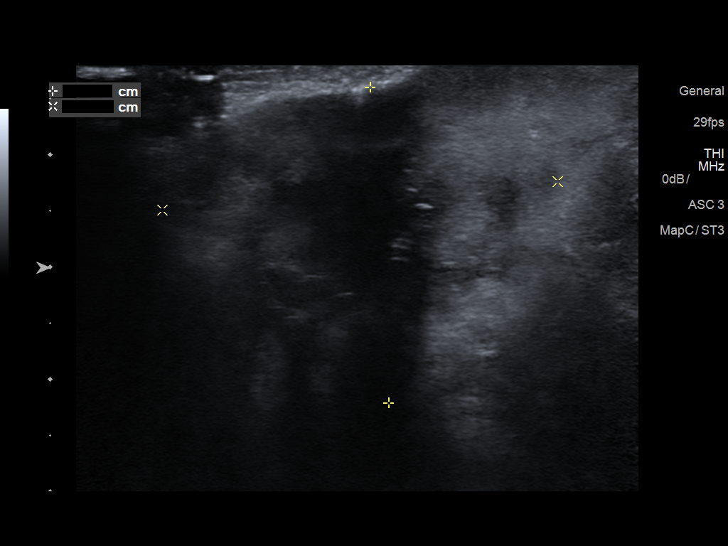
[im 7/21]
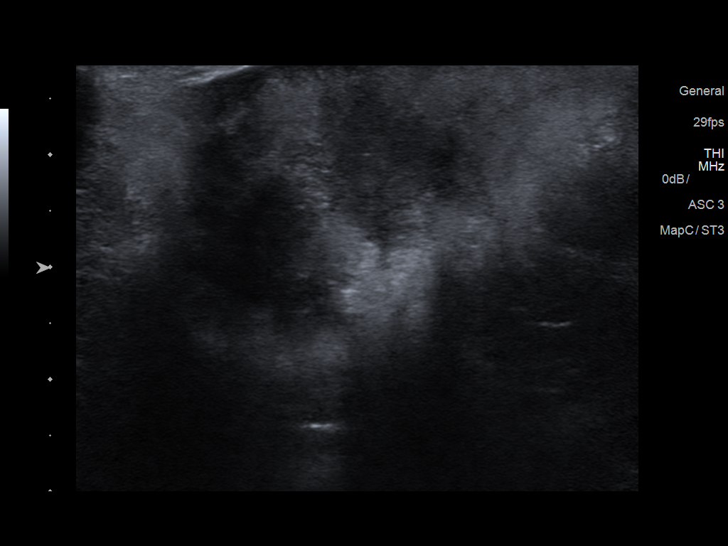
[im 8/21]
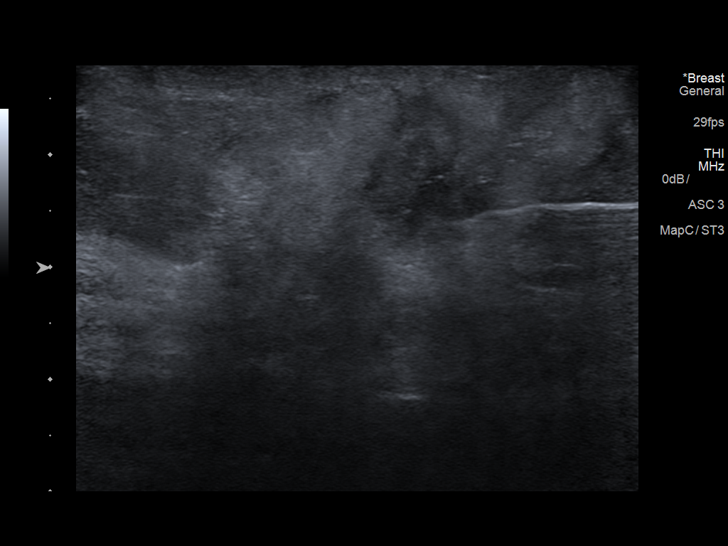
[im 10/21]
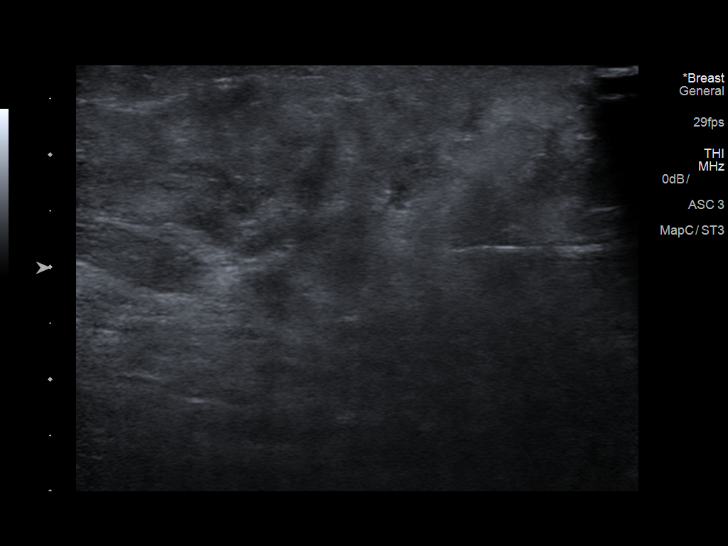
[im 12/21]
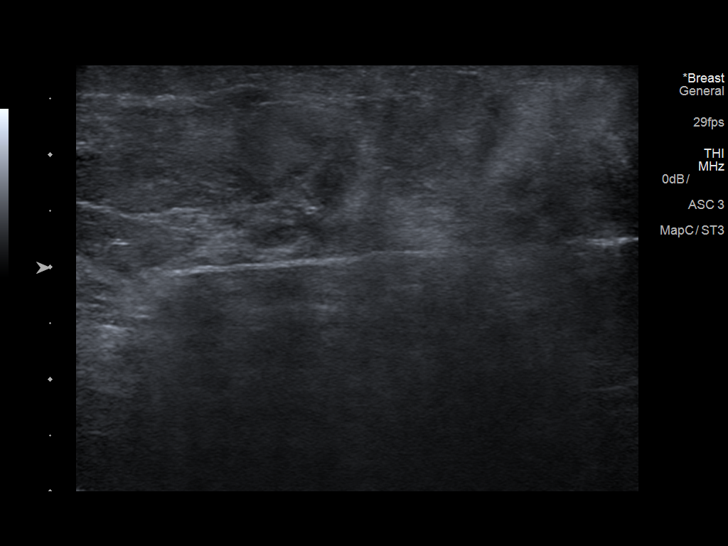
[im 14/21]
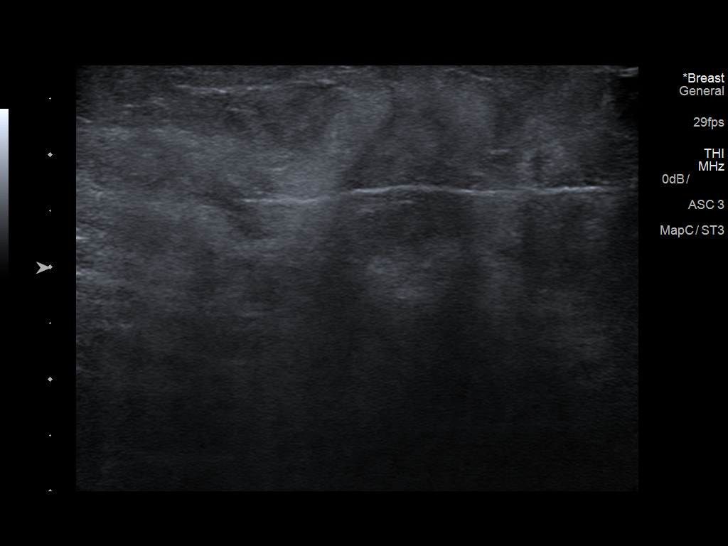
[im 15/21]
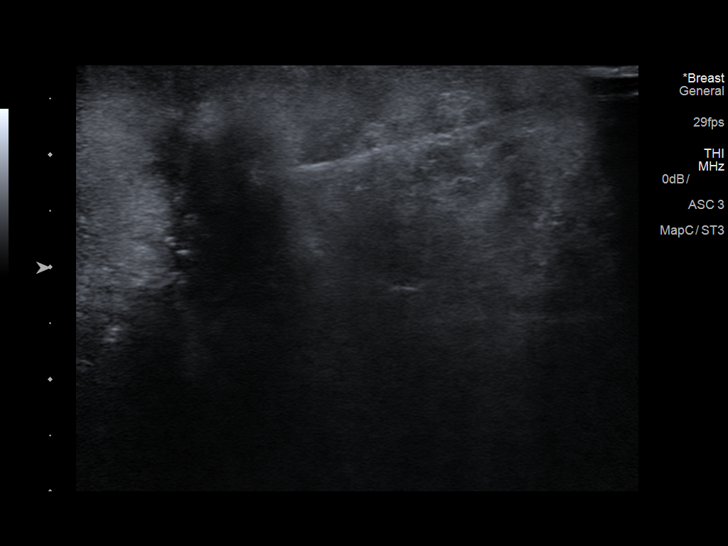
[im 17/21]
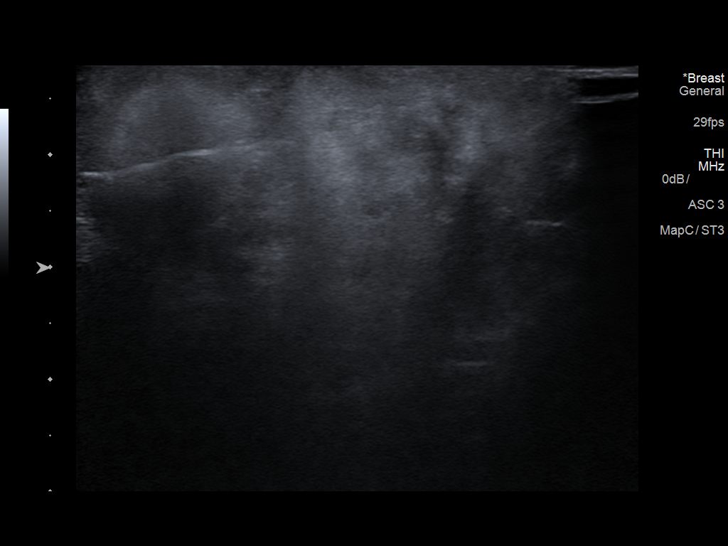
[im 19/21]
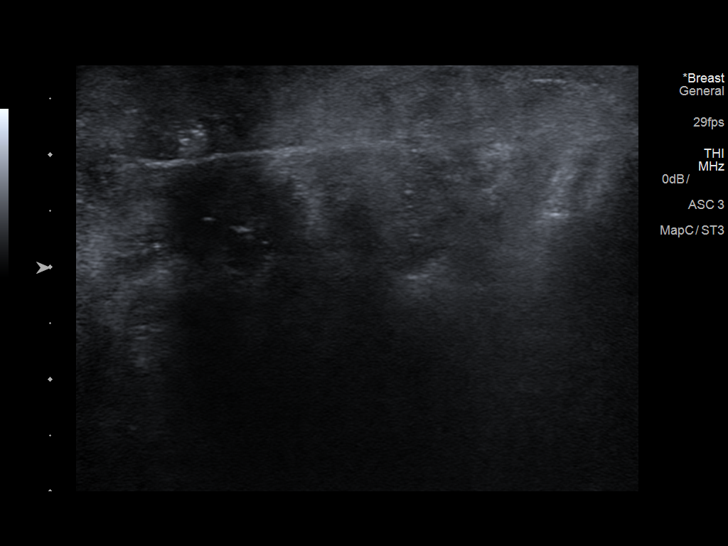
[im 21/21]
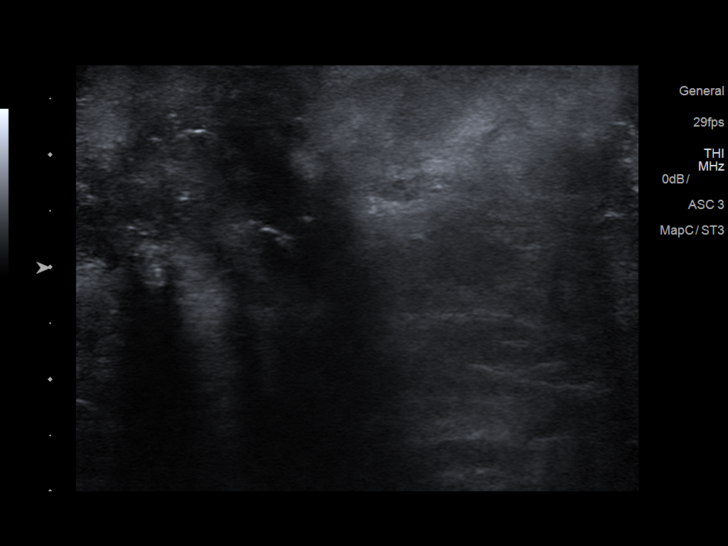

[12 of 21 positions shown; findings below may reference images not displayed]



Initial ultrasound imaging shows a large hypoechoic mass with
irregular margins at the 1 o'clock position approximately 6 cm from
the nipple, associated with microcalcifications and associated with
skin involvement. In fact, the overlying skin is ulcerated. Due to
its large size, an accurate ultrasound measurement was unobtainable,
though the 5.3 cm size from CT appears accurate. Sonographic
evaluation of the left axilla demonstrates no pathologic
lymphadenopathy.

Lesion quadrant: Upper outer quadrant.

Using sterile technique with chlorhexidine as skin antisepsis, 1%
Lidocaine as local anesthetic, under direct ultrasound
visualization, a 16 gauge Juve core needle device was used
to perform biopsy of the mass involving the upper outer quadrant of
the left breast using a lateral approach. Six core samples were
obtained and each was immediately placed in formalin.
IMPRESSION: 1. Ultrasound guided biopsy of a large mass involving the upper
outer quadrant of the left breast. No apparent complications.
2. The skin overlying the mass in the left upper quadrant is
ulcerated, and sonographic evaluation confirms skin involvement.
3. No pathologic left axillary lymphadenopathy.

## 2018-11-17 IMAGING — US US EXTREM LOW VENOUS BILAT
1 series · 13 of 24 positions shown · non-contrast
Comparison: 02/16/2017 lower extremity Doppler ultrasound report
from [REDACTED]

CLINICAL DATA: Follow-up bilateral lower extremity DVT seen on
outside ultrasound. History of metastatic breast cancer. Bilateral
lower extremity edema.



[Series 1: us extrem low venous bilat · 13 of 73 slices shown]
[im 1/73]
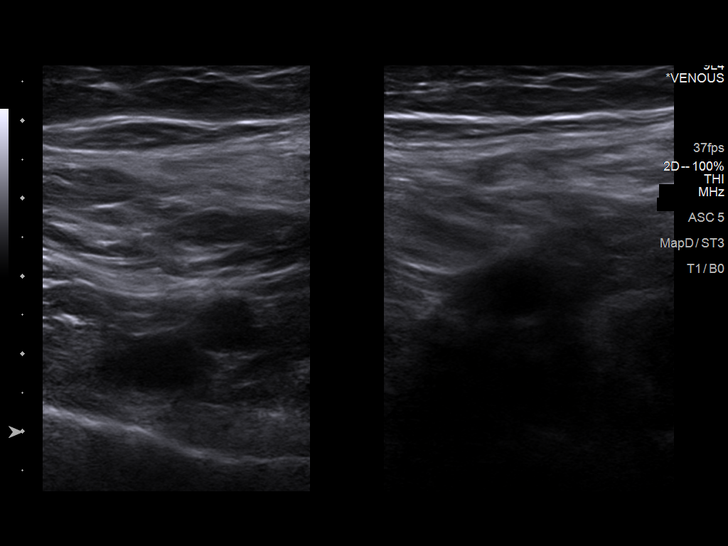
[im 7/73]
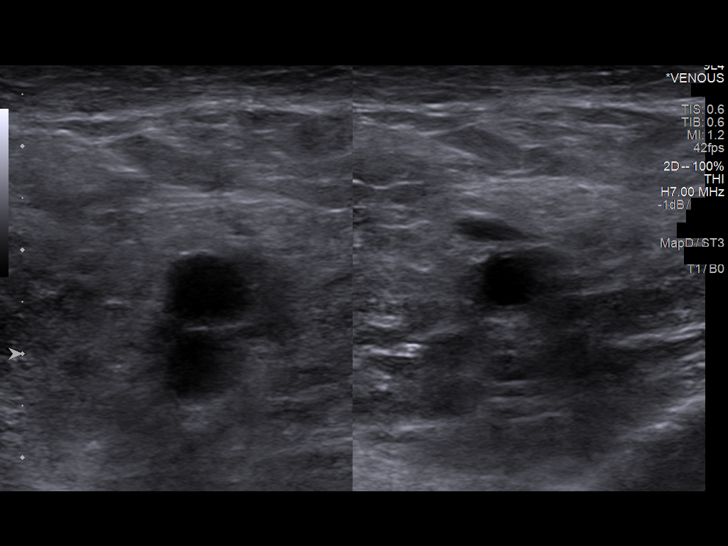
[im 13/73]
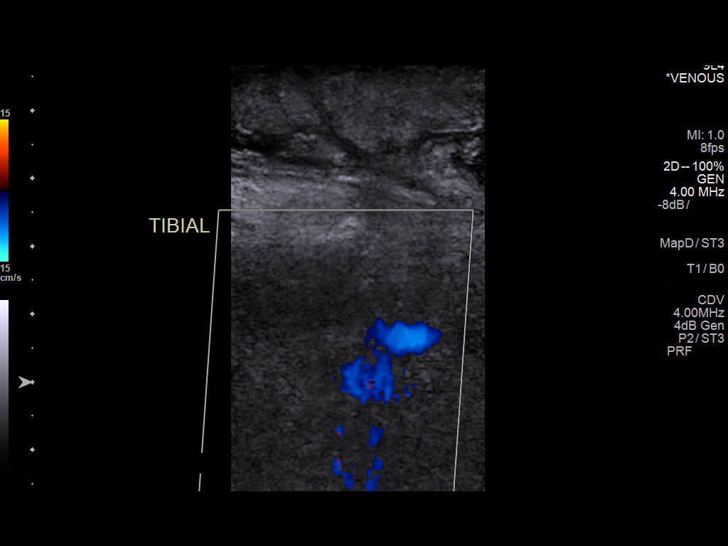
[im 19/73]
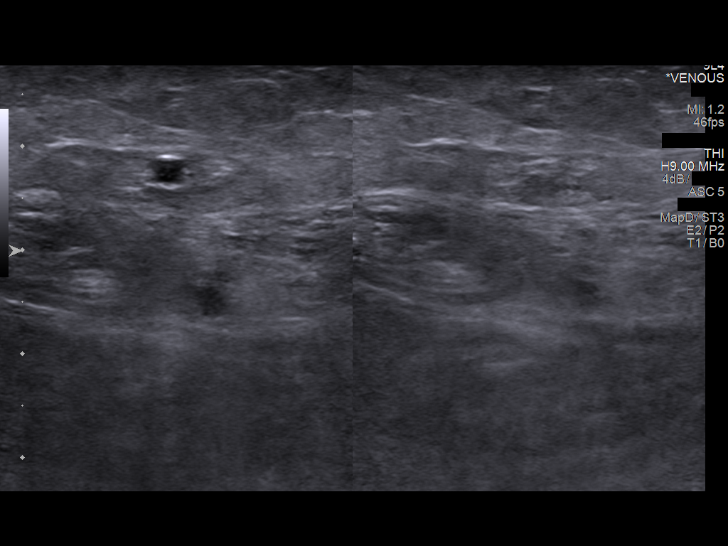
[im 26/73]
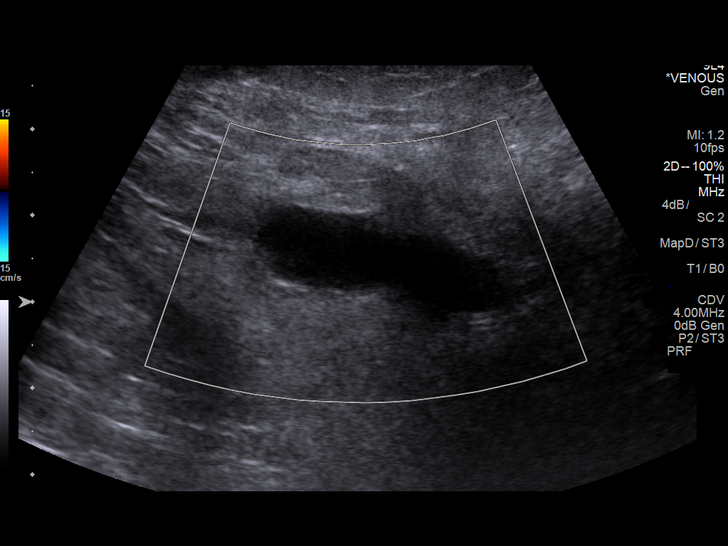
[im 32/73]
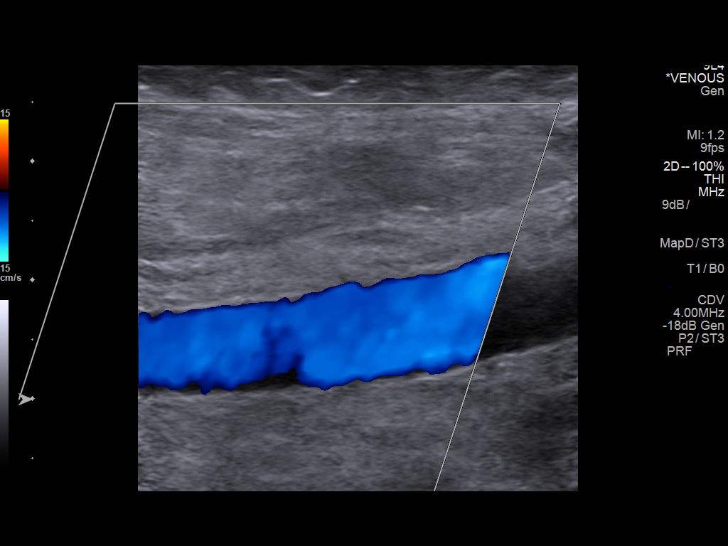
[im 38/73]
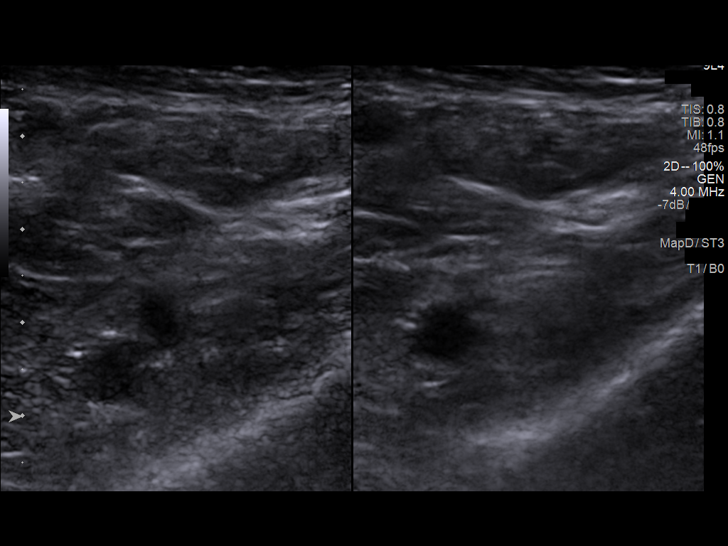
[im 41/73]
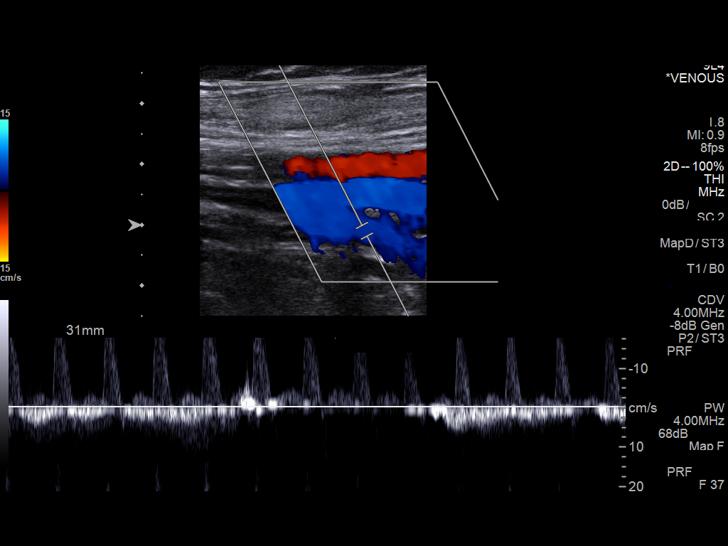
[im 47/73]
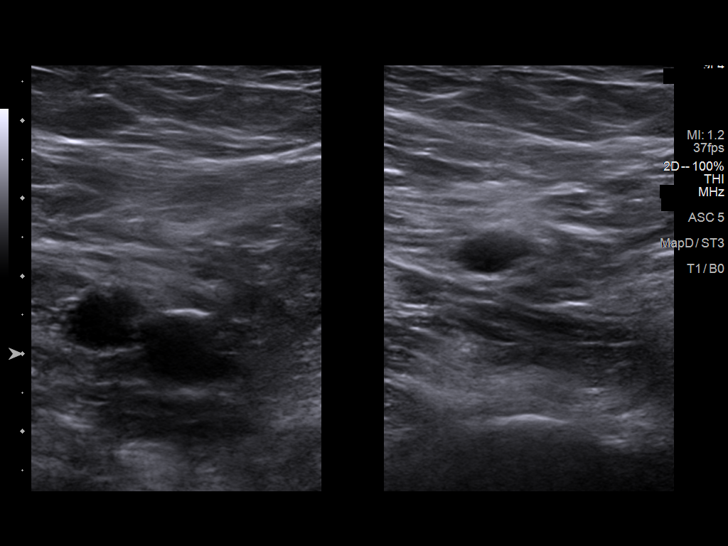
[im 54/73]
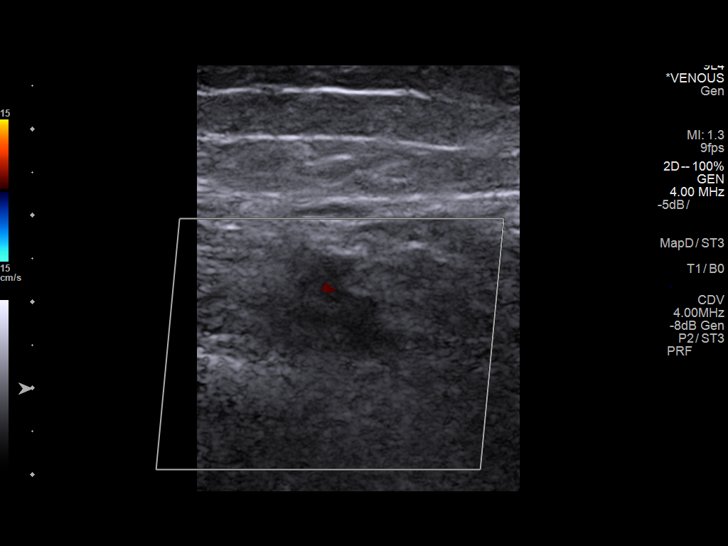
[im 60/73]
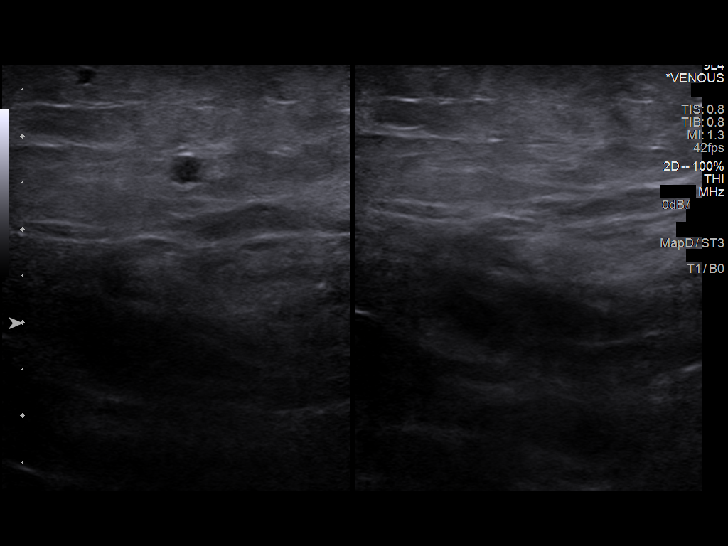
[im 66/73]
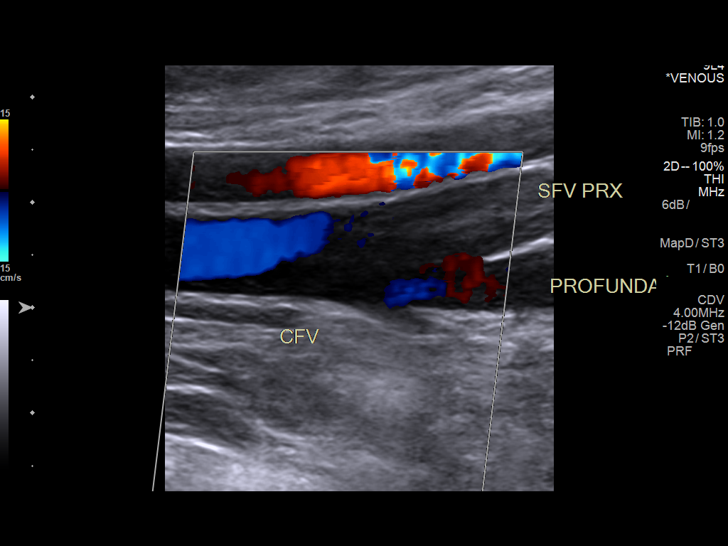
[im 73/73]
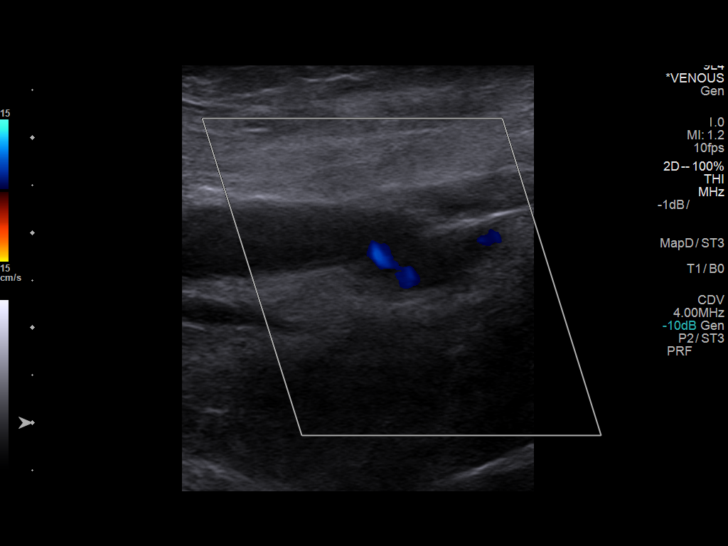

[13 of 24 positions shown; findings below may reference images not displayed]

FINDINGS: RIGHT LOWER EXTREMITY

Common Femoral Vein: Occlusive thrombus distally.

Saphenofemoral Junction: No evidence of thrombus. Normal
compressibility and flow on color Doppler imaging.

Profunda Femoral Vein: Occlusive thrombus.

Femoral Vein: Occlusive thrombus.

Popliteal Vein: Occlusive thrombus.

Calf Veins: Occlusive thrombus.

Superficial Great Saphenous Vein: No evidence of thrombus. Normal
compressibility and flow on color Doppler imaging.

Venous Reflux:  None.

Other Findings:  Lower leg edema.

LEFT LOWER EXTREMITY

Common Femoral Vein: No evidence of thrombus. Normal compressibility
and respiratory phasicity.

Saphenofemoral Junction: No evidence of thrombus. Normal
compressibility and respiratory phasicity.

Profunda Femoral Vein: No evidence of thrombus. Normal
compressibility and respiratory phasicity.

Femoral Vein: No evidence of thrombus. Normal compressibility and
respiratory phasicity.

Popliteal Vein: Nonocclusive thrombus extending towards the anterior
tibial vein.

Calf Veins: No evidence of thrombus. Normal compressibility and flow
on color Doppler imaging.

Superficial Great Saphenous Vein: No evidence of thrombus. Normal
compressibility and flow on color Doppler imaging.

Venous Reflux:  None.

Other Findings: Lower leg edema. 3.4 x 0.8 x 3.2 cm popliteal cyst.
IMPRESSION: 1. Positive for persistent occlusive DVT extending from the distal
right common femoral vein into the calf.
2. Nonocclusive thrombus in the left popliteal vein.
